# Patient Record
Sex: Male | Born: 1940 | Race: White | Hispanic: No | Marital: Married | Smoking: Never smoker
Health system: Southern US, Community
[De-identification: ages and names within clinical notes are randomized; demographics above are authoritative.]

## PROBLEM LIST (undated history)

## (undated) DIAGNOSIS — I4892 Unspecified atrial flutter: Secondary | ICD-10-CM

## (undated) DIAGNOSIS — I471 Supraventricular tachycardia, unspecified: Secondary | ICD-10-CM

## (undated) DIAGNOSIS — R42 Dizziness and giddiness: Secondary | ICD-10-CM

## (undated) DIAGNOSIS — Z87442 Personal history of urinary calculi: Secondary | ICD-10-CM

## (undated) DIAGNOSIS — I493 Ventricular premature depolarization: Secondary | ICD-10-CM

## (undated) DIAGNOSIS — I2510 Atherosclerotic heart disease of native coronary artery without angina pectoris: Secondary | ICD-10-CM

## (undated) DIAGNOSIS — I509 Heart failure, unspecified: Secondary | ICD-10-CM

## (undated) DIAGNOSIS — N289 Disorder of kidney and ureter, unspecified: Secondary | ICD-10-CM

## (undated) DIAGNOSIS — M109 Gout, unspecified: Secondary | ICD-10-CM

## (undated) DIAGNOSIS — N400 Enlarged prostate without lower urinary tract symptoms: Secondary | ICD-10-CM

## (undated) DIAGNOSIS — I480 Paroxysmal atrial fibrillation: Secondary | ICD-10-CM

## (undated) DIAGNOSIS — K219 Gastro-esophageal reflux disease without esophagitis: Secondary | ICD-10-CM

## (undated) DIAGNOSIS — M5431 Sciatica, right side: Secondary | ICD-10-CM

## (undated) DIAGNOSIS — I428 Other cardiomyopathies: Secondary | ICD-10-CM

## (undated) DIAGNOSIS — M1990 Unspecified osteoarthritis, unspecified site: Secondary | ICD-10-CM

## (undated) DIAGNOSIS — E039 Hypothyroidism, unspecified: Secondary | ICD-10-CM

## (undated) DIAGNOSIS — I499 Cardiac arrhythmia, unspecified: Secondary | ICD-10-CM

## (undated) HISTORY — DX: Heart failure, unspecified: I50.9

## (undated) HISTORY — PX: ABSCESS DRAINAGE: SHX1119

## (undated) HISTORY — DX: Dizziness and giddiness: R42

## (undated) HISTORY — DX: Supraventricular tachycardia, unspecified: I47.10

## (undated) HISTORY — DX: Other cardiomyopathies: I42.8

## (undated) HISTORY — DX: Ventricular premature depolarization: I49.3

## (undated) HISTORY — DX: Supraventricular tachycardia: I47.1

## (undated) HISTORY — PX: BACK SURGERY: SHX140

## (undated) HISTORY — DX: Unspecified atrial flutter: I48.92

## (undated) HISTORY — PX: CATARACT EXTRACTION: SUR2

## (undated) HISTORY — PX: COLONOSCOPY: SHX174

## (undated) HISTORY — DX: Paroxysmal atrial fibrillation: I48.0

## (undated) HISTORY — PX: PARTIAL KNEE ARTHROPLASTY: SHX2174

## (undated) HISTORY — DX: Atherosclerotic heart disease of native coronary artery without angina pectoris: I25.10

## (undated) HISTORY — PX: DUPUYTREN / PALMAR FASCIOTOMY: SUR601

---

## 1997-11-20 HISTORY — PX: HAND SURGERY: SHX662

## 2007-11-21 HISTORY — PX: CARDIAC CATHETERIZATION: SHX172

## 2008-01-11 ENCOUNTER — Inpatient Hospital Stay: Payer: Self-pay | Admitting: Internal Medicine

## 2008-01-11 ENCOUNTER — Other Ambulatory Visit: Payer: Self-pay

## 2008-01-12 ENCOUNTER — Other Ambulatory Visit: Payer: Self-pay

## 2008-01-13 ENCOUNTER — Ambulatory Visit: Payer: Self-pay | Admitting: Internal Medicine

## 2008-03-16 ENCOUNTER — Ambulatory Visit: Payer: Self-pay | Admitting: Internal Medicine

## 2008-04-20 ENCOUNTER — Ambulatory Visit: Payer: Self-pay | Admitting: Internal Medicine

## 2008-05-06 ENCOUNTER — Ambulatory Visit: Payer: Self-pay | Admitting: Internal Medicine

## 2008-05-06 ENCOUNTER — Encounter: Payer: Self-pay | Admitting: Internal Medicine

## 2008-05-06 LAB — CONVERTED CEMR LAB
CO2: 25 meq/L (ref 19–32)
Calcium: 9.1 mg/dL (ref 8.4–10.5)
Glucose, Bld: 86 mg/dL (ref 70–99)
HCT: 40.8 % (ref 39.0–52.0)
MCHC: 33.8 g/dL (ref 30.0–36.0)
MCV: 87 fL (ref 78.0–100.0)
Platelets: 190 10*3/uL (ref 150–400)
RDW: 13.2 % (ref 11.5–15.5)
Sodium: 141 meq/L (ref 135–145)
WBC: 5.7 10*3/uL (ref 4.0–10.5)

## 2008-05-13 ENCOUNTER — Ambulatory Visit: Payer: Self-pay | Admitting: Internal Medicine

## 2008-06-01 ENCOUNTER — Ambulatory Visit: Payer: Self-pay | Admitting: Internal Medicine

## 2008-12-28 ENCOUNTER — Ambulatory Visit: Payer: Self-pay | Admitting: Internal Medicine

## 2009-03-03 ENCOUNTER — Encounter: Payer: Self-pay | Admitting: Internal Medicine

## 2009-04-08 ENCOUNTER — Telehealth: Payer: Self-pay | Admitting: Internal Medicine

## 2009-04-15 ENCOUNTER — Telehealth: Payer: Self-pay | Admitting: Internal Medicine

## 2009-05-13 DIAGNOSIS — I429 Cardiomyopathy, unspecified: Secondary | ICD-10-CM | POA: Insufficient documentation

## 2009-05-13 DIAGNOSIS — I509 Heart failure, unspecified: Secondary | ICD-10-CM | POA: Insufficient documentation

## 2009-05-13 DIAGNOSIS — R42 Dizziness and giddiness: Secondary | ICD-10-CM | POA: Insufficient documentation

## 2009-05-13 DIAGNOSIS — I471 Supraventricular tachycardia: Secondary | ICD-10-CM | POA: Insufficient documentation

## 2009-06-28 ENCOUNTER — Ambulatory Visit: Payer: Self-pay | Admitting: Internal Medicine

## 2009-10-25 ENCOUNTER — Encounter: Payer: Self-pay | Admitting: Internal Medicine

## 2009-10-25 LAB — CONVERTED CEMR LAB
ALT: 18 units/L
AST: 21 units/L
Alkaline Phosphatase: 55 units/L
Calcium: 9.8 mg/dL
Glucose, Bld: 92 mg/dL
HCT: 44.9 %
HDL: 37.3 mg/dL
LDL Cholesterol: 142.9 mg/dL
MCHC: 33.9 g/dL
MCV: 87.5 fL
Sodium: 139.4 meq/L

## 2009-10-26 ENCOUNTER — Encounter: Payer: Self-pay | Admitting: Internal Medicine

## 2009-10-27 ENCOUNTER — Telehealth (INDEPENDENT_AMBULATORY_CARE_PROVIDER_SITE_OTHER): Payer: Self-pay | Admitting: *Deleted

## 2009-10-27 ENCOUNTER — Encounter: Payer: Self-pay | Admitting: Internal Medicine

## 2009-10-28 ENCOUNTER — Telehealth: Payer: Self-pay | Admitting: Internal Medicine

## 2009-11-09 ENCOUNTER — Ambulatory Visit: Payer: Self-pay | Admitting: Internal Medicine

## 2009-11-15 ENCOUNTER — Ambulatory Visit: Payer: Self-pay | Admitting: Unknown Physician Specialty

## 2009-11-17 ENCOUNTER — Ambulatory Visit: Payer: Self-pay | Admitting: Unknown Physician Specialty

## 2010-04-27 ENCOUNTER — Ambulatory Visit: Payer: Self-pay | Admitting: Unknown Physician Specialty

## 2010-05-03 ENCOUNTER — Telehealth: Payer: Self-pay | Admitting: Internal Medicine

## 2010-05-10 ENCOUNTER — Ambulatory Visit: Payer: Self-pay | Admitting: Internal Medicine

## 2010-05-11 ENCOUNTER — Ambulatory Visit: Payer: Self-pay | Admitting: Unknown Physician Specialty

## 2010-05-12 ENCOUNTER — Ambulatory Visit: Payer: Self-pay | Admitting: Unknown Physician Specialty

## 2010-09-01 ENCOUNTER — Telehealth: Payer: Self-pay | Admitting: Internal Medicine

## 2010-11-20 HISTORY — PX: JOINT REPLACEMENT: SHX530

## 2010-12-20 NOTE — Assessment & Plan Note (Signed)
Summary: per check out/sf   Visit Type:  Follow-up  CC:  Cardiac clearance for R knee surg. (Dr. Gerrit Heck). No cardiac complaints.  History of Present Illness:  Mr. Beckel is seen in followup for supraventricular tachycardia. He also has a history of cardiomyopathy  with a recent worsening of LV function measured by echo in  December at 35-40%. his ultrasound was undertaken simply as routine. Echo and catheterization from 2009 had demonstrated an ejection fraction of 4045% range with a mild nonobstructive 20% plaque in his LAD.   last fall he underwent Dupuytren's contracture surgery without difficulty. He has had no problems recently with chest pain shortness of breath edema. He has had one episode when taking his blood pressure his heart rate was noted to be fast;  this was largely without symptoms.  he unfortunately her his knee while walking. MRI demonstrated tears of the meniscus. He is scheduled to undergo upper scalp repair. He is here for preoperative clearance     Current Medications (verified): 1)  Ramipril 2.5 Mg Caps (Ramipril) .... Take One Capsule By Mouth Daily 2)  Bisoprolol Fumarate 5 Mg Tabs (Bisoprolol Fumarate) .... Take 1/2  Tablet By Mouth Daily 3)  Avodart 0.5 Mg Caps (Dutasteride) .[name].. 1 By Mouth Once Daily 4)  Omeprazole 20 Mg Cpdr (Omeprazole) .[name].. 1 By Mouth Once Daily 5)  Aspirin 81 Mg Tbec (Aspirin) .... Take One Tablet By Mouth Daily 6)  Daily Multi  Tabs (Multiple Vitamins-Minerals) .[name].. 1 By Mouth Once Daily 7)  Glucosamine-Chondroitin 1500-1200 Mg/88ml Liqd (Glucosamine-Chondroitin) .[name].. 1 By Mouth Once Daily 8)  Tylenol Extra Strength 500 Mg Tabs (Acetaminophen) .... 2000mg  Daily As Needed  Allergies (verified): No Known Drug Allergies  Vital Signs:  Patient profile:   70 year old male Height:      72 inches Weight:      215 pounds BMI:     29.26 Pulse rate:   62 / minute BP sitting:   112 / 80  (left arm) Cuff size:   regular  Vitals Entered  By: Bishop Dublin, CMA (May 10, 2010 1:38 PM)  Physical Exam  General:  Alert and oriented in no acute distress. HEENT  normal . Neck veins were flat; carotids brisk and full without bruits. No lymphadenopathy. Back without kyphosis. Lungs clear. Heart sounds regular without murmurs or gallops. PMI nondisplaced. Abdomen soft with active bowel sounds without midline pulsation or hepatomegaly. Femoral pulses and distal pulses intact. Extremities were without clubbing cyanosis or edemaSkin warm and dry. Neurological exam grossly normal affect is normal   EKG  Procedure date:  05/10/2010  Findings:      sinyus@ 63 .17/.08/.42/w normal  Impression & Recommendations:  Problem # 1:  CARDIOMYOPATHY, SECONDARY (ICD-425.9) stable on current medications.  HIs low blood pressure precludes further uptitration of meds  His updated medication list for this problem includes:    Ramipril 2.5 Mg Caps (Ramipril) .[name]... Take one capsule by mouth daily    Bisoprolol Fumarate 5 Mg Tabs (Bisoprolol fumarate) .[name]... Take 1/2  tablet by mouth daily    Aspirin 81 Mg Tbec (Aspirin) .[name]... Take one tablet by mouth daily  Problem # 2:  SVT/ PSVT/ PAT (ICD-427.0) one intercurretn episode of which we are aware  Problem # 3:  PREOPERATIVE EXAMINATION (ICD-V72.84) Mr. Albarran should be an acceptable risk for surgery. He might have SVT; this should be responsive to adenosine .  his left ventricular dysfunction close attention to fluids would be important

## 2010-12-20 NOTE — Progress Notes (Signed)
Summary: SURGICAL CLEARANCE   Phone Note Call from Patient Call back at Home Phone [phone]   Caller: SELF Call For: KLEIN Summary of Call: NEEDS SURGICAL CLEARANCE FOR KNEE SURGERY-DR CALIFF AT [facility] #[phone] Initial call taken by: Harlon Flor,  May 03, 2010 8:55 AM  Follow-up for Phone Call        per December OV pt was cleared for hand sugery is now pending knee surgery does he need OV or OK to proceed with surgery? Follow-up by: Benedict Needy, RN,  May 03, 2010 9:01 AM  Additional Follow-up for Phone Call Additional follow up Details #1::        i htink the answer is yes, in that he needs to have an evaluation as to the status of htings, ie has anything changed Additional Follow-up by: Nathen May, MD, [facility],  May 04, 2010 2:10 PM     Appended Document: SURGICAL CLEARANCE appointment scheduled for june with Dr [name]

## 2010-12-20 NOTE — Progress Notes (Signed)
Summary: surgical clearance  Phone Note Call from Patient   Summary of Call: Pt wanted to know if surgical clearance had been faxed to [facility].  Their fax number is [phone].  Please call pt back as well and advise. Initial call taken by: Park Breed,  May 03, 2010 3:26 PM  Follow-up for Phone Call        call and spoke to pt sent flag to Dr. [name] unsure if pt needs to be seen in OV before cleared for surgery.  Cell # [phone] Benedict Needy, RN  May 03, 2010 4:44 PM   Additional Follow-up for Phone Call Additional follow up Details #1::        pt left a message that he would like a call back regarding if the clearance has been sent yet. Additional Follow-up by: Harlon Flor,  May 05, 2010 2:25 PM    Additional Follow-up for Phone Call Additional follow up Details #2::    LMOM TCB Benedict Needy, RN  May 05, 2010 3:25 PM   Additional Follow-up for Phone Call Additional follow up Details #3:: Details for Additional Follow-up Action Taken: [facility] to inform the pt that he will need to come in for an appt before [name] can give clearance-Pt already had an appt scheduled for 6/28 @ 3:00-I reinformed him of the appt date and time and that we do not have a sooner appt available here with [name] in [facility] Additional Follow-up by: Harlon Flor,  May 05, 2010 4:09 PM

## 2010-12-20 NOTE — Progress Notes (Signed)
Summary: MEDICATION  Phone Note Call from Patient Call back at Home Phone [phone]   Caller: SELF Call For: KLEIN Summary of Call: PT WOULD LIKE TO KNOW IF HE WOULD HAVE ANY PROBLEM TAKING CELEBREX Initial call taken by: Harlon Flor,  September 01, 2010 12:11 PM  Follow-up for Phone Call        asked pt to talk to pharmacist. Benedict Needy, RN  September 01, 2010 4:58 PM

## 2011-02-24 ENCOUNTER — Other Ambulatory Visit: Payer: Self-pay | Admitting: Internal Medicine

## 2011-04-04 NOTE — Letter (Signed)
June 01, 2008    Dr. Corliss Parish  [address] [place], [place] [address]   RE:  Vincent, [name]  MRN:  [id number]  /  DOB:  02-11-1941   Dear Vincent [name]:   Vincent [name] comes in for followup following his catheterization which  demonstrated nonobstructive coronary disease and improvement in his LV  systolic function now to about 50% on the of blocker therapy.  His blood  pressure has tolerated the introduction a low-dose ACE inhibitors and he  is now on Altace 2.5 in addition to his bisoprolol 2.5 a day.   EXAMINATION:  His blood pressure is 96/60.  His pulse was 60.  LUNGS:  Clear.  HEART:  Sounds were regular.  EXTREMITIES:  Without edema.   IMPRESSION:  1. Improving cardiomyopathy.  2. 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  3. Beta blockers and ACE inhibitors for #1.  4. Intermittent lightheadedness likely related to hypotension.   Vincent [name] is stable at this point.  I have told him that he probably  should anticipate being on these medications long-term unless symptoms  of lightheadedness intervene.  At that point, I would probably drop his  ACE inhibitor.   We will see him again in 6 months' time.    Sincerely,      Vincent Salvia, MD, [facility]  Electronically Signed    SCK/MedQ  DD: 06/01/2008  DT: 06/01/2008  Job #: [id number]

## 2011-04-04 NOTE — Progress Notes (Signed)
[facility] OFFICE NOTE   NAME:Vincent [name], Vincent [name]                  MRN:          [id number]  DATE:12/28/2008                            DOB:          May 29, 1941    [name] comes in today in followup for a nonischemic cardiomyopathy  which is largely improved with estimated ejection fraction about 50%.   He is doing really quite well except for one morning in January, he felt  dizzy and his heart rate was going fast, but regular.  Wife recorded his  blood pressure in the 80s and pulse of 100 as compared to the normal  60s.  Other than that, he has done quite well.   His medications include bisoprolol 2.5 daily and the Altace 2.5 and he  has tolerated both of this.  He also takes Avodart every other day,  Prilosec, and Tylenol.   PHYSICAL EXAMINATION:  VITAL SIGNS:  His blood pressure is 104/72 with a  pulse of 59.  The weight was 218 which is stable.  LUNGS:  Clear.  HEART:  Sounds were regular.  ABDOMEN:  Soft.  EXTREMITIES:  Without edema.   IMPRESSION:  1. Supraventricular tachycardia probably atrioventricular nodal      reentry also with recurrent palpitations.  2. Dizziness associated with supraventricular tachycardia.  3. Modest left ventricular dysfunction markedly improved on beta-      blockers and ACE inhibitors.   [name] is stable.  We will plan to see him again in 6 months' time.  We will continue his current medications.  He is to come by the office  if he has recurrent lightheadedness associated with palpitations.     Duke Salvia, MD, [facility]  Electronically Signed    SCK/MedQ  DD: 12/28/2008  DT: 12/29/2008  Job #: [id number]   cc:   Corliss Parish

## 2011-04-04 NOTE — Letter (Signed)
March 16, 2008    Corliss Parish  [address] [place], [place] [address]   RE:  Vincent [name], Vincent [name]  MRN:  [id number]  /  DOB:  02-13-1941   Dear [name]:   I hope this letter finds you well, and hopefully I will have talked to  you before you get this. It was a pleasure to see Vincent [name] at your  request; he came in with his wife.   He recounts hospitalization in mid February when he presented with  nocturnal chest pain. While in the hospital, he apparently ruled out for  myocardial infarction; he was submitted for Myoview scanning which was  normal. His echocardiogram was not normal (see below). While in the  hospital, he was awakened in the middle of the night with a tachycardia.  The details of how it terminated are not available, but I was able to  track down an electrocardiogram of it (see below), and it subsequently  terminated.   It turns out he has a three- to five-year history of recurrent abrupt  onset/offset tachy palpitations. These spells have lasted minutes to  maybe hours. They are accompanied by significant lightheadedness but no  chest pain or shortness of breath. There is some accompanying  diaphoresis. They are FROG negative and diuretic negative.   He also has episodes of dizziness that are unassociated with his tachy  palpitations but are otherwise quite similar. They and his tachy  palpitations occur mostly while he is standing. He has not noted them  lying, and as noted in the hospital when he was lying down was unaware  of his tachycardia.   He has some modest exercise intolerance; mostly this is shortness of  breath which has been progressive over the last number of months. This  is further accompanied by fatigue which he dates back to the initiation  of his carvedilol (my mistake, atenolol). He has no syncope. He has had  no nocturnal dyspnea. He has had no peripheral edema.   His past medical history is notable for:  1. Allergies.  2. Constipation.  3.  GE reflux disease.  4. Urinary problems.  5. Arthritis.  6. Gout.   He has a remote history of hand surgery.   SOCIAL HISTORY:  He is married. He has two children, five grandchildren.  He is retired from the Restaurant manager, fast food. He does not use  cigarettes or alcohol or recreational drugs. He does play golf and works  in the yard.   His current medications include:  1. Atenolol 12.5.  2. Avodart 0.5.  3. Prilosec 20.   He has no known drug allergies.   On examination, he is an older Caucasian male appearing his stated age  of 77. His blood pressure is 106/74. His pulse was 64. His weight was  212, and he is 5 feet 11.  His HEENT exam demonstrated no icterus or xanthoma.  The neck veins were flat. The carotids were brisk and full bilaterally  without bruits.  The back was without kyphosis or scoliosis.  His lungs were clear.  HEART:  Sounds were regular without murmurs or gallops.  The abdomen was soft with active bowel sounds. No pulsation or  hepatomegaly.  Femoral pulses were 2+. Distal pulses were intact. There was no  clubbing, cyanosis, or edema.  His neurological exam was grossly normal.  The skin was warm and dry.   Electrocardiogram dated today demonstrated sinus rhythm at 64 with  interval  of 0.17/0.09/0.42. The axis was 8 degrees, and there was no  evidence of ventricular preexcitation.   Electrocardiogram dated February 22 at 0213 hours demonstrated a narrow  QRS tachycardia at a cycle length of approximately 400 milliseconds.  There was a prominent pseudo R prime in lead V1. It was not evident on  the electrocardiogram preceding this one by 24 hours or the one that  succeeded this one by 8 hours.   He brings in blood pressure recordings from home which are notable for  numbers as low as 87 and as high as 116.   Also, there was a Holter monitor dated March 2009 demonstrating 2% PVCs,  something that apparently had been seen on the  echocardiogram.   IMPRESSION:  1. 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  2. Cardiomyopathy of unclear origin with ejection fraction of 45%,      biatrial enlargement, and moderate AV regurgitation.  3. Class II congestive heart failure.  4. Moderate amount of fatigue, temporary related to the initiation of      his atenolol.   John, Vincent [name] has documented SVT which is associated with significant  hypotension and not surprising quite significant dizziness. Somewhat  surprisingly, he is only variably aware of the tachycardia, as when he  was in the hospital. Because of that, I think any symptoms of dizziness  I think we should infer are related to recurrent tachycardia.   Given his cardiomyopathy, the cause of which is not known but the  biatrial enlargement of which suggested there may be a restrictive  process, beta blockers are probably the right long-term therapy, and  somehow I got confused, I thought he was taking the carvedilol instead  of the atenolol 12.5, but I did give him alternative beta blockers  today, bisoprolol 2.5 and metoprolol 12.5 mg b.i.d., to see if either  one of those might be better tolerated than the atenolol. In the event  that there are not, we can certainly try carvedilol 3.125 b.i.d. and  see. It was his preference that if we could control the tachycardia as  well as get the long-term benefits of beta blockade for his  cardiomyopathy at one time that he would prefer not to have an ablation  procedure.   We did discuss the ablation at some length, and I think if he has  recurrent symptoms either of tachy palpitations or dizziness he would be  relatively inclined to pursue catheter ablation for management of the  tachycardia.   As relates to his cardiomyopathy, I think the  beta blockers are right. I  think also based on the SAVE data probably the right thing to do is to  add an ACE inhibitor if his blood pressure will tolerate it. I am not  altogether sanguine about this given the blood pressure measurements  that he brought in today, so it may well be something that we cannot do.   I will plan to sit down with him again in about four weeks to see how he  is doing on his beta blockers, and if he fails the first two, then I  will try him on carvedilol prior to seeing him as well.   John, I look forward to talking to you. If there is anything I can do,  please do not hesitate to contact me. My cell number is [phone].    Sincerely,      Duke Salvia, MD, [facility]  Electronically Signed    SCK/MedQ  DD: 03/16/2008  DT: 03/16/2008  Job #: [id number]

## 2011-04-04 NOTE — Letter (Signed)
April 20, 2008    Corliss Parish, MD  [address] [place],  [place] [address]   RE:  LEGRAND, LASSER  MRN:  [id number]  /  DOB:  [birth year]   Dear [name]:   Mr. Merrick came in today with his wife.  He has been doing better.  He is  feeling much less fatigue on the bisoprolol and metoprolol versus the  atenolol.  He is playing golf. He is having no problems with shortness  of breath or edema.  He has had no recurrent tachy palpitations or  dizziness.   MEDICATIONS:  In addition of metoprolol include the Avodart and aspirin.   As you recall, we did not start an ACE inhibitor because of his  relatively low blood pressure.   PHYSICAL EXAMINATION:  Today, his blood pressure was 90/60 with a pulse  of 68.  Interestingly, his wife brings in blood pressure measurements  for the last month and they are almost all above 100 with  just 5 in the  90s.  His lungs were clear.  His heart sounds were regular.  NECK:  Veins were flat.  The abdomen was soft.  EXTREMITIES:  Without edema.  Skin was warm and dry.   IMPRESSION:  1. 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  2. Cardiomyopathy with evidence of moderate mitral and tricuspid      regurgitation, question restrictive myopathy.  3. Borderline low blood pressure.   John, Mr. Day is doing fine. We are going to go ahead and put him back  on bisoprolol as opposed to metoprolol tartrate as we have data for it  in cardiomyopathy.   I am also going to start him on an ACE inhibitor specifically Altace at  1.25 mg a day.  I have reviewed with his wife the potential benefits as  well as potential side effects including angioedema and renal  dysfunction and as it relates to the latter we will plan to get blood  work in about 3 weeks time.   Given the underlying cardiomyopathy, the question remains as to its  cause.  It is presumed to be nonischemic as on his Myoview scan; I  discussed with them that my bias in patients like  him is to do  catheterization so that we know that he does not fall into the 10% of  patient's in whom balance ischemia might be mistaken for nonischemic  Myoview.  He would like to pursue catheterization and asked that we do  it.  To that end, I have scheduled him to be done at [facility] with Dr. Nicholes Mango in the next couple of weeks.  We will work this around his  golf schedule.  We have talked about the potential benefits as well as  potential risks and he understands and would like to proceed.   I will plan to see again in 3-4 months time.   If there is anything that I can do in the interim, please do not  hesitate to contact me.    Sincerely,      Duke Salvia, MD, [facility]  Electronically Signed    SCK/MedQ  DD: 04/20/2008  DT: 04/20/2008  Job #: [id number]

## 2011-05-12 ENCOUNTER — Encounter: Payer: Self-pay | Admitting: Cardiovascular Disease

## 2011-07-31 ENCOUNTER — Other Ambulatory Visit: Payer: Self-pay | Admitting: Internal Medicine

## 2011-08-10 ENCOUNTER — Encounter: Payer: Self-pay | Admitting: *Deleted

## 2011-08-10 ENCOUNTER — Other Ambulatory Visit: Payer: Self-pay | Admitting: Internal Medicine

## 2011-08-10 ENCOUNTER — Encounter: Payer: Self-pay | Admitting: Internal Medicine

## 2011-08-10 ENCOUNTER — Ambulatory Visit (INDEPENDENT_AMBULATORY_CARE_PROVIDER_SITE_OTHER): Payer: Medicare Other | Admitting: Internal Medicine

## 2011-08-10 DIAGNOSIS — I959 Hypotension, unspecified: Secondary | ICD-10-CM | POA: Insufficient documentation

## 2011-08-10 DIAGNOSIS — I493 Ventricular premature depolarization: Secondary | ICD-10-CM | POA: Insufficient documentation

## 2011-08-10 DIAGNOSIS — I4949 Other premature depolarization: Secondary | ICD-10-CM

## 2011-08-10 DIAGNOSIS — I471 Supraventricular tachycardia: Secondary | ICD-10-CM

## 2011-08-10 DIAGNOSIS — R61 Generalized hyperhidrosis: Secondary | ICD-10-CM

## 2011-08-10 DIAGNOSIS — I429 Cardiomyopathy, unspecified: Secondary | ICD-10-CM

## 2011-08-10 DIAGNOSIS — Z01810 Encounter for preprocedural cardiovascular examination: Secondary | ICD-10-CM | POA: Insufficient documentation

## 2011-08-10 NOTE — Progress Notes (Signed)
  HPI  Vincent [name] is a 70 y.o. male  seen in followup for supraventricular tachycardia. He also has a history of cardiomyopathy  with a recent worsening of LV function measured by echo in  December at 35-40%.  Echo and catheterization from 2009 had demonstrated an ejection fraction of 4045% range with a mild nonobstructive 20% plaque in his LAD.   He has undergone Dupuytren's contracture surgery without difficulty; he underwent arthroscopic knee surgery last year without difficulty. He is however now in need of total knee replacement.  He has however had 2 spells of late that are of concern. Both were associated with diaphoresis and hypotension occurring in the morning. I should note that he takes his cardiomyopathy medications at night. One of these was associated with a heart rate of 120; the family does not recall what it was regular or irregular. These were different from his SVT episodes.      Past Medical History  Diagnosis Date  . Congestive heart failure, unspecified   . Paroxysmal supraventricular tachycardia     SVT/PSVT/PAT  . Dizziness and giddiness   . Secondary cardiomyopathy, unspecified     Past Surgical History  Procedure Date  . Hand surgery 1999    Current Outpatient Prescriptions  Medication Sig Dispense Refill  . aspirin 81 MG EC tablet Take 81 mg by mouth daily.        . bisoprolol (ZEBETA) 5 MG tablet TAKE ONE-HALF (1/2) TABLET DAILY  45 tablet  1  . Chlorphen-Pseudoephed-APAP (CORICIDIN D PO) Take by mouth as needed.        . dutasteride (AVODART) 0.5 MG capsule Take 0.5 mg by mouth daily.        . Glucosamine-Chondroitin 1500-1200 MG/30ML LIQD Take by mouth daily.        . Imiquimod (ZYCLARA) 3.75 % CREA Apply topically at bedtime.        . metroNIDAZOLE (METROGEL) 1 % gel Apply topically daily.        . Multiple Vitamin (MULTIVITAMIN) tablet Take 1 tablet by mouth daily.        . naproxen sodium (ALEVE) 220 MG tablet Take 220 mg by mouth as  needed.        [name] omeprazole (PRILOSEC) 20 MG capsule Take 20 mg by mouth daily.        . ramipril (ALTACE) 2.5 MG capsule TAKE 1 CAPSULE DAILY  90 capsule  3  . traMADol (ULTRAM) 50 MG tablet Take 50 mg by mouth every 6 (six) hours as needed.          No Known Allergies  Review of Systems negative except from HPI and PMH  Physical Exam Well developed and well nourished in no acute distress HENT normal E scleral and icterus clear Neck Supple JVP flat; carotids brisk and full Clear to ausculation Regular rate and rhythm, no murmurs gallops or rub Soft with active bowel sounds No clubbing cyanosis and edema Alert and oriented, grossly normal motor and sensory function Skin Warm and Dry  ECGSinus rhythm at 65 Intervals 0.17/0.09/0.44 Occasional PVCs with a superior axis morphology  Assessment and  Plan

## 2011-08-10 NOTE — Assessment & Plan Note (Signed)
No known recurrences 

## 2011-08-10 NOTE — Assessment & Plan Note (Signed)
He is a frequent PVCs on his left card event. Given that he has change in left ventricular function quantitation of his PVC burn will be essential. Recent data suggests that broad QRS duration PVCs may be more likely associated with cardiomyopathy makes them of concern

## 2011-08-10 NOTE — Assessment & Plan Note (Signed)
The patient is in need of knee replacement surgery. He has a known cardiomyopathy which has not been assessed in the last couple of years. Given the alarming nature of these recent spells, I think not withstanding the absence of change in his functional status, that reevaluation of his cardiac performance is essential. To both exclude significant coronary disease as well as measured left ventricular function we'll undertake elective scan Myoview. He is unable to walk because of his knee

## 2011-08-10 NOTE — Assessment & Plan Note (Signed)
Continue current medications. 

## 2011-08-10 NOTE — Assessment & Plan Note (Signed)
He has had 2 episodes of hypotension associated one with a rapid heart rate and one with a normal heart rate. We will try and understand whether they are associated with irregular or regular heart rate. I described this to him and his wife. I've also suggested that in the event that he has recurrent hypotension, that he go to the local fire station where his vital signs and no echocardiographic recording can be obtained. They can also come here

## 2011-08-17 ENCOUNTER — Encounter: Payer: Self-pay | Admitting: *Deleted

## 2011-08-18 ENCOUNTER — Ambulatory Visit: Payer: Self-pay | Admitting: Internal Medicine

## 2011-08-18 ENCOUNTER — Telehealth: Payer: Self-pay

## 2011-08-18 DIAGNOSIS — I428 Other cardiomyopathies: Secondary | ICD-10-CM

## 2011-08-18 NOTE — Telephone Encounter (Signed)
LMOM for patient to call the office with information regarding the doctor doing the surgery.

## 2011-08-18 NOTE — Telephone Encounter (Signed)
Per Dr. Mariah Milling the patient is at low risk for his up coming surgery.  Dr. Mariah Milling read stress test and states test looks good.

## 2011-08-18 NOTE — Telephone Encounter (Signed)
Notified Dr. [name] per Dr. Mariah Milling the patient is at low risk for his upcoming surgery and that stress test looked good. Per Dr. [name] okay to send a cardiac clearance for his upcoming surgery.

## 2011-08-18 NOTE — Telephone Encounter (Signed)
[facility] in [place] with Dr. Jinny Sanders is doing his knee surgery.  The phone number to contact is [phone] and the fax number is [phone].

## 2011-08-21 NOTE — Telephone Encounter (Signed)
Mr. Vincent [name] should be at low risk for knee surgery. Please contact us for further questions.

## 2011-08-23 NOTE — Telephone Encounter (Signed)
Faxed clearance letter to Dr. Nicholaus Bloom at [facility] for Oct. 16, 2012. The phone number is [phone] and the fax number is [phone].

## 2011-08-25 ENCOUNTER — Encounter: Payer: Self-pay | Admitting: Internal Medicine

## 2012-01-27 ENCOUNTER — Other Ambulatory Visit: Payer: Self-pay | Admitting: Internal Medicine

## 2012-02-08 DIAGNOSIS — M25569 Pain in unspecified knee: Secondary | ICD-10-CM | POA: Insufficient documentation

## 2012-02-08 DIAGNOSIS — M25469 Effusion, unspecified knee: Secondary | ICD-10-CM | POA: Insufficient documentation

## 2012-03-20 DIAGNOSIS — Z96659 Presence of unspecified artificial knee joint: Secondary | ICD-10-CM | POA: Insufficient documentation

## 2012-03-20 DIAGNOSIS — M1710 Unilateral primary osteoarthritis, unspecified knee: Secondary | ICD-10-CM | POA: Insufficient documentation

## 2012-03-26 ENCOUNTER — Encounter: Payer: Self-pay | Admitting: Cardiovascular Disease

## 2012-03-26 ENCOUNTER — Ambulatory Visit (INDEPENDENT_AMBULATORY_CARE_PROVIDER_SITE_OTHER): Payer: Medicare Other | Admitting: Cardiovascular Disease

## 2012-03-26 VITALS — BP 118/64 | HR 72 | Ht 71.0 in | Wt 215.0 lb

## 2012-03-26 DIAGNOSIS — I493 Ventricular premature depolarization: Secondary | ICD-10-CM

## 2012-03-26 DIAGNOSIS — I471 Supraventricular tachycardia: Secondary | ICD-10-CM

## 2012-03-26 DIAGNOSIS — Z01810 Encounter for preprocedural cardiovascular examination: Secondary | ICD-10-CM

## 2012-03-26 DIAGNOSIS — I4949 Other premature depolarization: Secondary | ICD-10-CM

## 2012-03-26 DIAGNOSIS — I509 Heart failure, unspecified: Secondary | ICD-10-CM

## 2012-03-26 NOTE — Assessment & Plan Note (Signed)
The patient's need to have colonoscopy done. He did have a stress test done last year which overall was unremarkable. If his echocardiogram does not show significant worsening in his ejection fraction, he can proceed with colonoscopy with an overall low risk.

## 2012-03-26 NOTE — Assessment & Plan Note (Signed)
The patient is having more PVCs than in the past. He was noted to have PVCs in September 2012 but now has progressed to ventricular bigeminy. He had a nuclear stress test in September 2012 which showed no evidence of ischemia. His symptoms include dizziness but no palpitations. The patient does not seem to consume excessive amount of caffeine to explain this. I will check routine labs including basic metabolic profile magnesium and TSH to look for reversible causes. I recommend a 48 hour Holter monitor as well as an echocardiogram to evaluate his LV systolic function. If his ejection fraction is worse than before, he might need to be evaluated by EP. He has seen Dr. [name] in the past.

## 2012-03-26 NOTE — Progress Notes (Signed)
HPI  Vincent [name] is a 71 y.o. male seen in followup for supraventricular tachycardia. He also has a history of cardiomyopathy with a previous ejection fraction of 35-40%. Most recently it was 53% on nuclear stress test from September of 2012. Echo and catheterization from 2009 had demonstrated an ejection fraction of 40-45% range with a mild nonobstructive 20% plaque in his LAD.  He underwent right knee replacement in the fall of last year without complications. He was seen recently by gastroenterology for routine colonoscopy. He was noted to have frequent premature beats on physical exam. He was asked to followup with [place] and get clearance before colonoscopy. The patient did drink more caffeine that date and his average. He does not feel these PVCs. He does complain of occasional dizziness. He denies any chest pain. He has mild exertional dyspnea which has not worsened. Since his knee replacement, he has been able to resume playing golf. He denies excessive caffeine intake. He is not aware of thyroid problems. He does not smoke or drink alcohol.   No Known Allergies   Current Outpatient Prescriptions on File Prior to Visit  Medication Sig Dispense Refill  . aspirin 81 MG EC tablet Take 81 mg by mouth daily.        . bisoprolol (ZEBETA) 5 MG tablet TAKE ONE-HALF (1/2) TABLET DAILY                                   (DUE FOR FOLLOW UP WITH DR. [name])  45 tablet  0  . dutasteride (AVODART) 0.5 MG capsule Take 0.5 mg by mouth daily.        . Glucosamine-Chondroitin 1500-1200 MG/30ML LIQD Take by mouth daily.        . Multiple Vitamin (MULTIVITAMIN) tablet Take 1 tablet by mouth daily.        . naproxen sodium (ALEVE) 220 MG tablet Take 220 mg by mouth as needed.        [name] omeprazole (PRILOSEC) 20 MG capsule Take 20 mg by mouth daily.        . ramipril (ALTACE) 2.5 MG capsule TAKE 1 CAPSULE DAILY  90 capsule  1     Past Medical History  Diagnosis Date  . Congestive heart failure,  unspecified   . Dizziness and giddiness   . Secondary cardiomyopathy, unspecified   . Paroxysmal supraventricular tachycardia     SVT/PSVT/PAT     Past Surgical History  Procedure Date  . Hand surgery 1999  . Total knee arthroplasty      Family History  Problem Relation Age of Onset  . Coronary artery disease      family hx  . Heart failure      family hx - CHF     History   Social History  . Marital Status: Married    Spouse Name: N/A    Number of Children: N/A  . Years of Education: N/A   Occupational History  . Not on file.   Social History Main Topics  . Smoking status: Never Smoker   . Smokeless tobacco: Not on file   Comment: tobacco use - no  . Alcohol Use: No  . Drug Use: No  . Sexually Active: Not on file   Other Topics Concern  . Not on file   Social History Narrative   Retired, married, gets regular exercise.      PHYSICAL EXAM  BP 118/64  Pulse 72  Ht 5\' 11"  (1.803 m)  Wt 215 lb (97.523 kg)  BMI 29.99 kg/m2  Constitutional: He is oriented to person, place, and time. He appears well-developed and well-nourished. No distress.  HENT: No nasal discharge.  Head: Normocephalic and atraumatic.  Eyes: Pupils are equal and round. Right eye exhibits no discharge. Left eye exhibits no discharge.  Neck: Normal range of motion. Neck supple. No JVD present. No thyromegaly present.  Cardiovascular: Normal rate, regular rhythm with frequent premature beats, normal heart sounds and. Exam reveals no gallop and no friction rub. No murmur heard.  Pulmonary/Chest: Effort normal and breath sounds normal. No stridor. No respiratory distress. He has no wheezes. He has no rales. He exhibits no tenderness.  Abdominal: Soft. Bowel sounds are normal. He exhibits no distension. There is no tenderness. There is no rebound and no guarding.  Musculoskeletal: Normal range of motion. He exhibits no edema and no tenderness.  Neurological: He is alert and oriented to  person, place, and time. Coordination normal.  Skin: Skin is warm and dry. No rash noted. He is not diaphoretic. No erythema. No pallor.  Psychiatric: He has a normal mood and affect. His behavior is normal. Judgment and thought content normal.      EKG: Normal sinus rhythm with frequent PVCs in the form of ventricular bigeminy.   ASSESSMENT AND PLAN

## 2012-03-26 NOTE — Patient Instructions (Signed)
Your physician has requested that you have an echocardiogram. Echocardiography is a painless test that uses sound waves to create images of your heart. It provides your doctor with information about the size and shape of your heart and how well your heart's chambers and valves are working. This procedure takes approximately one hour. There are no restrictions for this procedure.  Your physician has recommended that you wear a holter monitor. Holter monitors are medical devices that record the heart's electrical activity. Doctors most often use these monitors to diagnose arrhythmias. Arrhythmias are problems with the speed or rhythm of the heartbeat. The monitor is a small, portable device. You can wear one while you do your normal daily activities. This is usually used to diagnose what is causing palpitations/syncope (passing out).  Labs to be done today.   Follow up after tests.

## 2012-03-26 NOTE — Assessment & Plan Note (Signed)
No evidence of recent episodes. Continue treatment with bisoprolol.

## 2012-03-27 LAB — TSH: TSH: 3.15 u[IU]/mL (ref 0.450–4.500)

## 2012-03-27 LAB — BASIC METABOLIC PANEL
CO2: 21 mmol/L (ref 20–32)
Calcium: 9.3 mg/dL (ref 8.6–10.2)
Glucose: 111 mg/dL — ABNORMAL HIGH (ref 65–99)
Potassium: 4.8 mmol/L (ref 3.5–5.2)
Sodium: 137 mmol/L (ref 134–144)

## 2012-03-27 LAB — MAGNESIUM: Magnesium: 1.8 mg/dL (ref 1.6–2.6)

## 2012-04-05 ENCOUNTER — Other Ambulatory Visit: Payer: Self-pay | Admitting: Internal Medicine

## 2012-04-10 ENCOUNTER — Encounter (INDEPENDENT_AMBULATORY_CARE_PROVIDER_SITE_OTHER): Payer: Medicare Other

## 2012-04-10 ENCOUNTER — Other Ambulatory Visit (INDEPENDENT_AMBULATORY_CARE_PROVIDER_SITE_OTHER): Payer: Medicare Other

## 2012-04-10 ENCOUNTER — Other Ambulatory Visit: Payer: Self-pay

## 2012-04-10 DIAGNOSIS — I471 Supraventricular tachycardia: Secondary | ICD-10-CM

## 2012-04-10 DIAGNOSIS — R55 Syncope and collapse: Secondary | ICD-10-CM

## 2012-04-10 DIAGNOSIS — I493 Ventricular premature depolarization: Secondary | ICD-10-CM

## 2012-04-10 DIAGNOSIS — I509 Heart failure, unspecified: Secondary | ICD-10-CM

## 2012-04-10 DIAGNOSIS — I369 Nonrheumatic tricuspid valve disorder, unspecified: Secondary | ICD-10-CM

## 2012-04-11 ENCOUNTER — Telehealth: Payer: Self-pay | Admitting: *Deleted

## 2012-04-11 NOTE — Telephone Encounter (Signed)
LM on identified voice that echocardiogram normal

## 2012-04-11 NOTE — Telephone Encounter (Signed)
Advised echo normal

## 2012-04-18 ENCOUNTER — Ambulatory Visit (INDEPENDENT_AMBULATORY_CARE_PROVIDER_SITE_OTHER): Payer: Medicare Other | Admitting: Cardiovascular Disease

## 2012-04-18 ENCOUNTER — Other Ambulatory Visit: Payer: Self-pay

## 2012-04-18 ENCOUNTER — Encounter: Payer: Self-pay | Admitting: Cardiovascular Disease

## 2012-04-18 VITALS — BP 76/60 | HR 60 | Ht 70.0 in | Wt 212.5 lb

## 2012-04-18 DIAGNOSIS — I4949 Other premature depolarization: Secondary | ICD-10-CM

## 2012-04-18 DIAGNOSIS — R002 Palpitations: Secondary | ICD-10-CM

## 2012-04-18 DIAGNOSIS — I471 Supraventricular tachycardia: Secondary | ICD-10-CM

## 2012-04-18 DIAGNOSIS — I493 Ventricular premature depolarization: Secondary | ICD-10-CM

## 2012-04-18 NOTE — Patient Instructions (Signed)
Stop Ramipril.  Monitor your blood pressure once daily.  Follow up in 3 months.

## 2012-04-19 NOTE — Assessment & Plan Note (Signed)
No evidence of recurrent episodes on Holter monitor.

## 2012-04-19 NOTE — Assessment & Plan Note (Signed)
I will go ahead and stop Ramipril.

## 2012-04-19 NOTE — Assessment & Plan Note (Signed)
The patient is having more PVCs than in the past. He was noted to have PVCs in September 2012 but now has progressed to ventricular bigeminy. He had a nuclear stress test in September 2012 which showed no evidence of ischemia. His symptoms include mild dizziness but no palpitations. The patient does not seem to consume excessive amount of caffeine to explain this. His labs were unremarkable including normal thyroid function. Continue treatment with a beta blocker at this time. The dose cannot be increased due to bradycardia. His echocardiogram showed that his ejection fraction is actually stable at 50-55%. If the patient continues to have frequent PVCs, I will consider sending him back to see Dr. [name] for a second opinion regarding antiarrhythmic medications.

## 2012-04-19 NOTE — Progress Notes (Signed)
HPI  Vincent [name] is a 71 y.o. male seen in followup for supraventricular tachycardia. He also has a history of cardiomyopathy with a previous ejection fraction of 35-40%. Most recently it was 53% on nuclear stress test from September of 2012.  Echo and catheterization from 2009 had demonstrated an ejection fraction of 40-45% range with a mild nonobstructive 20% plaque in his LAD.  He underwent right knee replacement in the fall of last year without complications. He was seen recently by gastroenterology for routine colonoscopy. He was noted to have frequent premature beats on physical exam. He was asked to followup with [place] and get clearance before colonoscopy.  He denies any chest pain. He has mild exertional dyspnea which has not worsened. Since his knee replacement, he has been able to resume playing golf.  He denies excessive caffeine intake. He is not aware of thyroid problems. He does not smoke or drink alcohol. He underwent evaluation with a Holter monitor which showed frequent PVCs some in the form of bigeminy. 21% of beats were actually PVCs. His echocardiogram showed low normal LV systolic function with an ejection fraction of 50-55% with mild mitral regurgitation. The patient is actually feeling well.  No Known Allergies   Current Outpatient Prescriptions on File Prior to Visit  Medication Sig Dispense Refill  . aspirin 81 MG EC tablet Take 81 mg by mouth daily.        . bisoprolol (ZEBETA) 5 MG tablet TAKE ONE-HALF (1/2) TABLET DAILY                                   (DUE FOR FOLLOW UP WITH DR. [name])  45 tablet  3  . dutasteride (AVODART) 0.5 MG capsule Take 0.5 mg by mouth daily.        . Glucosamine-Chondroitin 1500-1200 MG/30ML LIQD Take by mouth daily.        . Multiple Vitamin (MULTIVITAMIN) tablet Take 1 tablet by mouth daily.        . naproxen sodium (ALEVE) 220 MG tablet Take 220 mg by mouth as needed.        [name] omeprazole (PRILOSEC) 20 MG capsule Take 20 mg by  mouth daily.           Past Medical History  Diagnosis Date  . Congestive heart failure, unspecified   . Dizziness and giddiness   . Secondary cardiomyopathy, unspecified   . Paroxysmal supraventricular tachycardia     SVT/PSVT/PAT     Past Surgical History  Procedure Date  . Hand surgery 1999  . Total knee arthroplasty      Family History  Problem Relation Age of Onset  . Coronary artery disease      family hx  . Heart failure      family hx - CHF     History   Social History  . Marital Status: Married    Spouse Name: N/A    Number of Children: N/A  . Years of Education: N/A   Occupational History  . Not on file.   Social History Main Topics  . Smoking status: Never Smoker   . Smokeless tobacco: Not on file   Comment: tobacco use - no  . Alcohol Use: No  . Drug Use: No  . Sexually Active: Not on file   Other Topics Concern  . Not on file   Social History Narrative   Retired, married,  gets regular exercise.     PHYSICAL EXAM   BP 76/60  Pulse 60  Ht 5\' 10"  (1.778 m)  Wt 212 lb 8 oz (96.389 kg)  BMI 30.49 kg/m2 Manual blood pressure is 98/60. Constitutional: He is oriented to person, place, and time. He appears well-developed and well-nourished. No distress.  HENT: No nasal discharge.  Head: Normocephalic and atraumatic.  Eyes: Pupils are equal and round. Right eye exhibits no discharge. Left eye exhibits no discharge.  Neck: Normal range of motion. Neck supple. No JVD present. No thyromegaly present.  Cardiovascular: Bradycardic, irregular rhythm, normal heart sounds and. Exam reveals no gallop and no friction rub. No murmur heard.  Pulmonary/Chest: Effort normal and breath sounds normal. No stridor. No respiratory distress. He has no wheezes. He has no rales. He exhibits no tenderness.  Abdominal: Soft. Bowel sounds are normal. He exhibits no distension. There is no tenderness. There is no rebound and no guarding.  Musculoskeletal: Normal  range of motion. He exhibits no edema and no tenderness.  Neurological: He is alert and oriented to person, place, and time. Coordination normal.  Skin: Skin is warm and dry. No rash noted. He is not diaphoretic. No erythema. No pallor.  Psychiatric: He has a normal mood and affect. His behavior is normal. Judgment and thought content normal.       EKG: Sinus bradycardia with frequent PVCs and nonspecific T wave changes.   ASSESSMENT AND PLAN

## 2012-04-23 ENCOUNTER — Ambulatory Visit: Payer: Medicare Other | Admitting: Internal Medicine

## 2012-06-18 ENCOUNTER — Telehealth: Payer: Self-pay | Admitting: Cardiovascular Disease

## 2012-06-18 NOTE — Telephone Encounter (Signed)
Pt's wife called to say pt's BP and HR has been low (96/68, 75/49, HR=40,46BPM).  She says pt feels "weak" over the last few days.  Denies syncope or near syncope. Pt remains on bisoprolol 1/2 tablet daily. I advised wife to have pt hold this med to see if this helps symptoms, BP and HR and to call us in 2 days to report. Understanding verb.

## 2012-06-18 NOTE — Telephone Encounter (Signed)
Pt wife calling states that pt has had a low BP and HR over the last few days.  75/49 hr 46

## 2012-06-20 ENCOUNTER — Ambulatory Visit (INDEPENDENT_AMBULATORY_CARE_PROVIDER_SITE_OTHER): Payer: Medicare Other | Admitting: Cardiovascular Disease

## 2012-06-20 ENCOUNTER — Telehealth: Payer: Self-pay | Admitting: Cardiovascular Disease

## 2012-06-20 ENCOUNTER — Encounter: Payer: Self-pay | Admitting: Cardiovascular Disease

## 2012-06-20 VITALS — BP 100/60 | HR 40 | Ht 71.0 in | Wt 218.2 lb

## 2012-06-20 DIAGNOSIS — I498 Other specified cardiac arrhythmias: Secondary | ICD-10-CM

## 2012-06-20 DIAGNOSIS — I4949 Other premature depolarization: Secondary | ICD-10-CM

## 2012-06-20 DIAGNOSIS — Z01810 Encounter for preprocedural cardiovascular examination: Secondary | ICD-10-CM

## 2012-06-20 DIAGNOSIS — I493 Ventricular premature depolarization: Secondary | ICD-10-CM

## 2012-06-20 MED ORDER — MAGNESIUM OXIDE -MG SUPPLEMENT 400 (240 MG) MG PO TABS: 1.0000 | ORAL_TABLET | Freq: Two times a day (BID) | ORAL | Status: DC

## 2012-06-20 MED ORDER — METOPROLOL SUCCINATE ER 25 MG PO TB24: 25.0000 mg | ORAL_TABLET | Freq: Every day | ORAL | Status: DC

## 2012-06-20 NOTE — Telephone Encounter (Signed)
See other telephone note.  

## 2012-06-20 NOTE — Progress Notes (Signed)
HPI  Vincent [name] is a 71 y.o. male seen in followup for supraventricular tachycardia and ventricular bigeminy. He also has a history of cardiomyopathy with a previous ejection fraction of 35-40%. Most recently it was 50-55% on echocardiogram in may of this year. Catheterization from 2009 had demonstrated an ejection fraction of 40-45% range with a mild nonobstructive 20% plaque in his LAD.  He underwent right knee replacement in the fall of last year without complications. He was seen recently by gastroenterology for routine colonoscopy. He was noted to have frequent premature beats on physical exam. He underwent evaluation with a Holter monitor which showed frequent PVCs some in the form of bigeminy. 21% of beats were actually PVCs. His echocardiogram showed low normal LV systolic function with an ejection fraction of 50-55% with mild mitral regurgitation.  During his last visit, he was noted to be hypotensive but with minimal symptoms. Thus, ramipril was discontinued. He continued to take bisoprolol but recently his blood pressure started dropping again and he has not taken the medication over the last few days. He noticed increased fatigue and dyspnea when he is playing golf without palpitations. He also has been sweating profusely. He does not consume significant amount of caffeine. He denies any chest pain.   No Known Allergies   Current Outpatient Prescriptions on File Prior to Visit  Medication Sig Dispense Refill  . aspirin 81 MG EC tablet Take 81 mg by mouth daily.        [name] dutasteride (AVODART) 0.5 MG capsule Take 0.5 mg by mouth daily.        . Glucosamine-Chondroitin 1500-1200 MG/30ML LIQD Take by mouth daily.        . Multiple Vitamin (MULTIVITAMIN) tablet Take 1 tablet by mouth daily.        . naproxen sodium (ALEVE) 220 MG tablet Take 220 mg by mouth as needed.        [name] omeprazole (PRILOSEC) 20 MG capsule Take 20 mg by mouth daily.        . metoprolol succinate (TOPROL  XL) 25 MG 24 hr tablet Take 1 tablet (25 mg total) by mouth daily.  30 tablet  6     Past Medical History  Diagnosis Date  . Congestive heart failure, unspecified   . Dizziness and giddiness   . Secondary cardiomyopathy, unspecified   . Paroxysmal supraventricular tachycardia     SVT/PSVT/PAT     Past Surgical History  Procedure Date  . Hand surgery 1999  . Total knee arthroplasty      Family History  Problem Relation Age of Onset  . Coronary artery disease      family hx  . Heart failure      family hx - CHF     History   Social History  . Marital Status: Married    Spouse Name: N/A    Number of Children: N/A  . Years of Education: N/A   Occupational History  . Not on file.   Social History Main Topics  . Smoking status: Never Smoker   . Smokeless tobacco: Not on file   Comment: tobacco use - no  . Alcohol Use: No  . Drug Use: No  . Sexually Active: Not on file   Other Topics Concern  . Not on file   Social History Narrative   Retired, married, gets regular exercise.      PHYSICAL EXAM   BP 100/60  Pulse 40  Ht 5\' 11"  (1.803  m)  Wt 218 lb 4 oz (98.998 kg)  BMI 30.44 kg/m2 Constitutional: He is oriented to person, place, and time. He appears well-developed and well-nourished. No distress.  HENT: No nasal discharge.  Head: Normocephalic and atraumatic.  Eyes: Pupils are equal and round. Right eye exhibits no discharge. Left eye exhibits no discharge.  Neck: Normal range of motion. Neck supple. No JVD present. No thyromegaly present.  Cardiovascular: Normal rate, regular rhythm with premature beats, normal heart sounds and. Exam reveals no gallop and no friction rub. No murmur heard.  Pulmonary/Chest: Effort normal and breath sounds normal. No stridor. No respiratory distress. He has no wheezes. He has no rales. He exhibits no tenderness.  Abdominal: Soft. Bowel sounds are normal. He exhibits no distension. There is no tenderness. There is no  rebound and no guarding.  Musculoskeletal: Normal range of motion. He exhibits no edema and no tenderness.  Neurological: He is alert and oriented to person, place, and time. Coordination normal.  Skin: Skin is warm and dry. No rash noted. He is not diaphoretic. No erythema. No pallor.  Psychiatric: He has a normal mood and affect. His behavior is normal. Judgment and thought content normal.       EKG: Sinus rhythm with ventricular bigeminy nonspecific T wave changes   ASSESSMENT AND PLAN

## 2012-06-20 NOTE — Telephone Encounter (Signed)
I called pt's wife to assess pt's symptoms since holding bisoprolol She says pt is feeling much better She says his weakness has improved BP's are much better (111/78, 107/65) but HR's still in low 50's/40's I explained last holter revealed bigeminy/PVCs and HR pt is getting at home may be inaccurate I advised wife to continue to hold beta blocker and I will discuss with Dr. Kirke Corin and call her back  I discussed with Dr. Kirke Corin He suggests keep f/u with him at end of month and schedule consult with Dr. [name]  He also says ok to have pt continue to hold bisoprolol  I will call pt to advise

## 2012-06-20 NOTE — Telephone Encounter (Signed)
Get him for an office visit with me today or tomorrow.

## 2012-06-20 NOTE — Telephone Encounter (Signed)
Patient wants to tell you something else that his wife may not have mentioned earlier.

## 2012-06-20 NOTE — Telephone Encounter (Signed)
Pt called to say he also has been getting more fatigued easier and has noticed increasing diaphoresis.  He denies CP I explained Dr. Jari Sportsman plan to have pt see Dr. [name] He will continue to monitor BP/HR and symptoms and let us know if anything changes prior to appts scheduled  I will let Dr. Kirke Corin know of new symptoms of fatigue/diaphoresis

## 2012-06-20 NOTE — Assessment & Plan Note (Signed)
The patient continues to have ventricular bigeminy. He had a nuclear stress test in September 2012 which showed no evidence of ischemia. He seems to be having more symptoms of dyspnea and fatigue. He does not consume excessive amount of caffeine to explain this. His labs were unremarkable including normal thyroid function. I will try a different beta blocker and start metoprolol extended release 25 mg once daily. I will also add magnesium oxide 400 mg twice daily. It might be difficult to increase the dose of beta blocker due to low blood pressure. His echocardiogram showed that his ejection fraction is actually stable at 50-55%.  I will send to see Dr. [name] for a second opinion regarding antiarrhythmic medications.

## 2012-06-20 NOTE — Telephone Encounter (Signed)
Scheduled appt for today at 4pm

## 2012-06-20 NOTE — Assessment & Plan Note (Signed)
The patient's need to have colonoscopy done. He did have a stress test done last year which overall was unremarkable. Once the issues of PVCs and low blood pressure are resolved, he can undergo colonoscopy at an overall low risk.

## 2012-06-20 NOTE — Telephone Encounter (Signed)
Please read below I will make appt with Dr. [name] Let me know if I need to do anything differently Thanks

## 2012-06-20 NOTE — Patient Instructions (Addendum)
Stop Bisoprolol. Start Metoprolol ER 25 mg once daily.  Start Magnesium Oxide 400 mg twice daily.   Keep appointment in August.

## 2012-06-27 ENCOUNTER — Telehealth: Payer: Self-pay

## 2012-06-27 NOTE — Telephone Encounter (Signed)
Patient calling in regards to an appointment with Dr. [name] per Dr. Jari Sportsman request. Dr. [name] does not have anything available until Sept 5, 2013 which the patient is on the schedule for.  Do you feel this is too long for the patient to wait or is there any other recommendations?  The patient does have a follow up with  Dr. Kirke Corin on Aug. 27, 2013 which can be reschedule for after Sept 5, appointment with Dr. [name]. The patient states doing okay but just following through with his second opinion with Dr. [name]. Please advise.

## 2012-06-29 NOTE — Telephone Encounter (Signed)
September 5 is fine with Dr. [name]. This is not urgent. He can reschedule his appointment with me to one month after he sees Dr. [name].

## 2012-07-01 NOTE — Telephone Encounter (Signed)
Notified patient need to keep appointment with Dr. [name] on July 25, 2012 at 10:15 and we will reschedule to see Dr. Kirke Corin on Sept. 12, 2013.

## 2012-07-16 ENCOUNTER — Ambulatory Visit: Payer: Medicare Other | Admitting: Cardiovascular Disease

## 2012-07-22 ENCOUNTER — Telehealth: Payer: Self-pay | Admitting: Adult Health

## 2012-07-22 NOTE — Telephone Encounter (Signed)
Vincent [name] is a 71 year old patient of Dr. are recommended that he follows for CHF, PSVT, and secondary, cardiomyopathy. He is due to be seen by electrophysiology on Thursday September 5, but had some complaints of left-sided chest discomfort and left arm numbness.  He states that the symptoms began on 07/21/2012 where he is originally noticed that his left arm was tingling with difficulty moving it for several minutes He states that it hurt when he tried to move his arm and therefore had difficulty doing so. He said th the symptoms were transient but then he began to have some mild left-sided discomfortHe not seek medical attention, but  took an aspirin and rested and the symptoms went away.  Today he has been noticing some fatigue, but stated that after he ate breakfast he felt better. He has had no recurrent symptoms. I have advised him that if the discomfort or the tingling comes back that he should seek medical attention after taking his aspirin.   I  have left a message with the [place] office to have them call him in the a.m. to evaluate or have him come in as an add on. I hcontinues to have this discomfort or any recurrence of the same he is to report to the emergency room for evaluation and not wait for followup appointment.

## 2012-07-23 NOTE — Telephone Encounter (Signed)
Pt called back, says he has had no further spells since the 1 spell over the w/e. He denies any symptoms at the present time. He has appt with Dr. [name] scheduled for this Thursday 9/5. i advised him to call us sooner than appt should he develop symptoms again/they get worse. Understanding verb.

## 2012-07-23 NOTE — Telephone Encounter (Signed)
LMTCB

## 2012-07-23 NOTE — Telephone Encounter (Signed)
Per Lorin Picket PA wouldl like the nurse to call pt and see howpt is doing. See note from 9/2

## 2012-07-25 ENCOUNTER — Other Ambulatory Visit: Payer: Self-pay | Admitting: Internal Medicine

## 2012-07-25 ENCOUNTER — Ambulatory Visit (INDEPENDENT_AMBULATORY_CARE_PROVIDER_SITE_OTHER): Payer: Medicare Other | Admitting: Internal Medicine

## 2012-07-25 ENCOUNTER — Encounter: Payer: Self-pay | Admitting: Internal Medicine

## 2012-07-25 VITALS — BP 106/72 | HR 62 | Ht 71.0 in | Wt 212.5 lb

## 2012-07-25 DIAGNOSIS — R079 Chest pain, unspecified: Secondary | ICD-10-CM

## 2012-07-25 DIAGNOSIS — I471 Supraventricular tachycardia: Secondary | ICD-10-CM

## 2012-07-25 DIAGNOSIS — R209 Unspecified disturbances of skin sensation: Secondary | ICD-10-CM

## 2012-07-25 DIAGNOSIS — R202 Paresthesia of skin: Secondary | ICD-10-CM

## 2012-07-25 DIAGNOSIS — I4949 Other premature depolarization: Secondary | ICD-10-CM

## 2012-07-25 DIAGNOSIS — I493 Ventricular premature depolarization: Secondary | ICD-10-CM

## 2012-07-25 DIAGNOSIS — R200 Anesthesia of skin: Secondary | ICD-10-CM

## 2012-07-25 DIAGNOSIS — R29898 Other symptoms and signs involving the musculoskeletal system: Secondary | ICD-10-CM

## 2012-07-25 DIAGNOSIS — I429 Cardiomyopathy, unspecified: Secondary | ICD-10-CM

## 2012-07-25 LAB — TROPONIN I: Troponin-I: <0.02 ng/mL

## 2012-07-25 NOTE — Assessment & Plan Note (Signed)
None

## 2012-07-25 NOTE — Assessment & Plan Note (Signed)
The patient had transient left arm weakness and discomfort is accompanied part by left leg symptoms and some "chills" which   sounds like shaking  He has no known obstructive disease. We'll obtain a troponin because of the accompanying shortness of breath and otherwise pursue a Myoview. If the troponin is abnormal we will pursue catheterization. However, my suspicion is more likely that it is a neurological event. He had no carotid bruits. He has no residual symptoms. We'll undertake an MRI that normal we'll have to pursue underlying causes.

## 2012-07-25 NOTE — Patient Instructions (Addendum)
Your physician recommends that you have lab work today at [facility].  Dr. [name] wants you to have an MRI of brain. We will call you to arrange.  Dr. [name] wants you to have a stress test.  We will call you with date/time.

## 2012-07-25 NOTE — Assessment & Plan Note (Signed)
Significant interval improvement 

## 2012-07-25 NOTE — Assessment & Plan Note (Signed)
PVCs having narrow QRS morphology of 120-30 ms and axis of +180. The bigeminal pattern resolved and his symptoms with that. We will continue the routine started by Dr. Marcheta Grammes i.e. magnesium and metoprolol  Has left ventricular function has in fact improved, no other indication for therapy is present

## 2012-07-25 NOTE — Progress Notes (Signed)
ELECTROPHYSIOLOGY CONSULT NOTE  Patient ID: Vincent [name], MRN: [id number], DOB/AGE: 72/09/1941 72 y.o. Admit date: (Not on file) Date of Consult: 07/25/2012  Primary Physician: [name], MD Primary Cardiologist: MA  Chief Complaint: Bigmininy      HPI Vincent [name] is a 71 y.o. male seen in followup for supraventricular tachycardia. He also has a history of cardiomyopathy  with a recent worsening of LV function measured by echo in December at 35-40%.  Echo and catheterization from 2009 had demonstrated an ejection fraction of 40-45% range with a mild nonobstructive 20% plaque in his LAD.   Echocardiogram May 2013 demonstrated ejection fraction 20-25% with mild MR and LAE  He was seen earlier this summer in my absence because of weakness which correlated temporally with the identification of bigeminy with an unusual PVC pattern (see below) which improved significantly with a different beta blocker and the introduction of magnesium.4 nights ago as he was going to bed he developed a clumsiness and a tingling in his left hand which resulted in him knocking over class because he couldn't stand adequately. He then developed tingling proximal thigh. Accompany shortness of breath. No chest pain. It is also accompanied "of chills" and shaking and flushed his left lower extremity. They were going to go to the hospital   But symptoms abated and he went home  By hte next am his symptoms were totally resolved  Past Medical History  Diagnosis Date  . Congestive heart failure, unspecified   . Dizziness and giddiness   . Secondary cardiomyopathy, unspecified   . Paroxysmal supraventricular tachycardia     SVT/PSVT/PAT      Surgical History:  Past Surgical History  Procedure Date  . Hand surgery 1999  . Total knee arthroplasty      Home Meds: Prior to Admission medications   Medication Sig Start Date End Date Taking? Authorizing Provider  aspirin 81 MG EC tablet Take 81  mg by mouth daily.     Yes Historical Provider, MD  dutasteride (AVODART) 0.5 MG capsule Take 0.5 mg by mouth daily.     Yes Historical Provider, MD  Glucosamine-Chondroitin 1500-1200 MG/30ML LIQD Take by mouth daily.     Yes Historical Provider, MD  Magnesium Oxide 400 (240 MG) MG TABS Take 1 tablet by mouth 2 (two) times daily. 06/20/12  Yes Iran Ouch, MD  metoprolol succinate (TOPROL XL) 25 MG 24 hr tablet Take 1 tablet (25 mg total) by mouth daily. 06/20/12 06/20/13 Yes Iran Ouch, MD  Multiple Vitamin (MULTIVITAMIN) tablet Take 1 tablet by mouth daily.     Yes Historical Provider, MD  naproxen sodium (ALEVE) 220 MG tablet Take 220 mg by mouth as needed.     Yes Historical Provider, MD  omeprazole (PRILOSEC) 20 MG capsule Take 20 mg by mouth daily.     Yes Historical Provider, MD      Allergies: No Known Allergies  History   Social History  . Marital Status: Married    Spouse Name: N/A    Number of Children: N/A  . Years of Education: N/A   Occupational History  . Not on file.   Social History Main Topics  . Smoking status: Never Smoker   . Smokeless tobacco: Not on file   Comment: tobacco use - no  . Alcohol Use: No  . Drug Use: No  . Sexually Active: Not on file   Other Topics Concern  . Not on file   Social History Narrative  Retired, married, gets regular exercise.     Family History  Problem Relation Age of Onset  . Coronary artery disease      family hx  . Heart failure      family hx - CHF       Physical Exam:  Blood pressure 106/72, pulse 62, height 5\' 11"  (1.803 m), weight 212 lb 8 oz (96.389 kg). General: Well developed, well nourished male in no acute distress. Head: Normocephalic, atraumatic, sclera non-icteric, no xanthomas, nares are without discharge. Lymph Nodes:  none Back:  , no CVA tendersness Neck: Negative for carotid bruits. JVD not elevated. Lungs: Clear bilaterally to auscultation without wheezes, rales, or rhonchi.  Breathing is unlabored. Heart: RRR with S1 S2. No  murmur , rubs, or gallops appreciated. Abdomen: Soft, non-tender, non-distended with normoactive bowel sounds. No hepatomegaly. No rebound/guarding. No obvious abdominal masses. Msk:  Strength and tone appear normal for age. Extremities: No clubbing or cyanosis. No  edema.  Distal pedal pulses are 2+ and equal bilaterally. Skin: Warm and Dry Neuro: Alert and oriented X 3. CN III-XII intact Grossly normal sensory and motor function .no weakness in hand or arm n L Psych:  Responds to questions appropriately with a normal affect.        EKG:  Sinus rhythm at 62 Interval 17/10/44 No acute changes.   Tracing of August 1 bigeminy with a PVC morphology of left bundle axis of +180   Assessment and Plan:  Sherryl Manges

## 2012-07-26 ENCOUNTER — Telehealth: Payer: Self-pay

## 2012-07-26 ENCOUNTER — Other Ambulatory Visit: Payer: Self-pay | Admitting: Internal Medicine

## 2012-07-26 ENCOUNTER — Ambulatory Visit: Payer: Self-pay | Admitting: Internal Medicine

## 2012-07-26 DIAGNOSIS — R202 Paresthesia of skin: Secondary | ICD-10-CM

## 2012-07-26 DIAGNOSIS — R200 Anesthesia of skin: Secondary | ICD-10-CM

## 2012-07-26 NOTE — Telephone Encounter (Signed)
Verbal instructions given to pt re: MRI brain scheduled for this am as well as stress myoview scheduled for 08/01/12.  I explained to pt Dr. Kirke Corin unable to perform stress test until 9/18 (b/c of clinic schedule, etc). Pt ok with having Dr. Mariah Milling perform stress test. Also informed pt of normal troponin results.

## 2012-08-01 ENCOUNTER — Ambulatory Visit: Payer: Self-pay | Admitting: Internal Medicine

## 2012-08-01 ENCOUNTER — Ambulatory Visit: Payer: Medicare Other | Admitting: Cardiovascular Disease

## 2012-08-01 DIAGNOSIS — R079 Chest pain, unspecified: Secondary | ICD-10-CM

## 2012-08-02 ENCOUNTER — Telehealth: Payer: Self-pay | Admitting: Cardiovascular Disease

## 2012-08-02 NOTE — Telephone Encounter (Signed)
Pt calling to find out about his stress test results.  Please call to advise.

## 2012-08-05 NOTE — Telephone Encounter (Signed)
Preliminary results given to pt "low risk scan"

## 2012-08-15 ENCOUNTER — Ambulatory Visit (INDEPENDENT_AMBULATORY_CARE_PROVIDER_SITE_OTHER): Payer: Medicare Other | Admitting: Cardiovascular Disease

## 2012-08-15 ENCOUNTER — Encounter: Payer: Self-pay | Admitting: Cardiovascular Disease

## 2012-08-15 VITALS — BP 112/68 | HR 61 | Ht 71.0 in | Wt 213.5 lb

## 2012-08-15 DIAGNOSIS — I493 Ventricular premature depolarization: Secondary | ICD-10-CM

## 2012-08-15 DIAGNOSIS — I509 Heart failure, unspecified: Secondary | ICD-10-CM

## 2012-08-15 DIAGNOSIS — I4949 Other premature depolarization: Secondary | ICD-10-CM

## 2012-08-15 DIAGNOSIS — Z01810 Encounter for preprocedural cardiovascular examination: Secondary | ICD-10-CM | POA: Insufficient documentation

## 2012-08-15 DIAGNOSIS — I471 Supraventricular tachycardia: Secondary | ICD-10-CM

## 2012-08-15 NOTE — Assessment & Plan Note (Signed)
He is currently not having any PVCs. His blood pressure also improved after switching him to Toprol. I recommend continuing magnesium supplement on Toprol 25 mg once daily. Recent cardiac evaluation including a nuclear stress test showed no evidence of ischemia with normal ejection fraction.

## 2012-08-15 NOTE — Progress Notes (Signed)
HPI  Vincent [name] is a 71 y.o. male seen in followup for supraventricular tachycardia and ventricular bigeminy. He also has a history of cardiomyopathy with a previous ejection fraction of 35-40%. Most recently it was 50-55% on echocardiogram in May of this year. Catheterization from 2009 had demonstrated an ejection fraction of 40-45% range with a mild nonobstructive 20% plaque in his LAD.  He underwent right knee replacement in the fall of last year without complications. He was seen a few months ago by gastroenterology for routine colonoscopy. He was noted to have frequent premature beats on physical exam. He underwent evaluation with a Holter monitor which showed frequent PVCs some in the form of bigeminy. 21% of beats were actually PVCs. His echocardiogram showed low normal LV systolic function with an ejection fraction of 50-55% with mild mitral regurgitation.  He also had issues with hypotension. Thus, ramipril was discontinued. I switched him from bisoprolol to Toprol and added magnesium supplement twice daily. Since then, he had significant improvement in his symptoms. He was seen recently by Dr. [name]. At that time, he complained of left arm numbness. He underwent a treadmill nuclear stress test which showed no evidence of ischemia with normal ejection fraction. He also had a brain MRI which was normal.  No Known Allergies   Current Outpatient Prescriptions on File Prior to Visit  Medication Sig Dispense Refill  . aspirin 81 MG EC tablet Take 81 mg by mouth daily.        [name] dutasteride (AVODART) 0.5 MG capsule Take 0.5 mg by mouth daily.        . Glucosamine-Chondroitin 1500-1200 MG/30ML LIQD Take by mouth daily.        . Magnesium Oxide 400 (240 MG) MG TABS Take 1 tablet by mouth 2 (two) times daily.  60 tablet  6  . metoprolol succinate (TOPROL XL) 25 MG 24 hr tablet Take 1 tablet (25 mg total) by mouth daily.  30 tablet  6  . Multiple Vitamin (MULTIVITAMIN) tablet Take 1  tablet by mouth daily.        . naproxen sodium (ALEVE) 220 MG tablet Take 220 mg by mouth as needed.        [name] omeprazole (PRILOSEC) 20 MG capsule Take 20 mg by mouth daily.           Past Medical History  Diagnosis Date  . Congestive heart failure, unspecified   . Dizziness and giddiness   . Secondary cardiomyopathy, unspecified   . Paroxysmal supraventricular tachycardia     SVT/PSVT/PAT     Past Surgical History  Procedure Date  . Hand surgery 1999  . Total knee arthroplasty      Family History  Problem Relation Age of Onset  . Coronary artery disease      family hx  . Heart failure      family hx - CHF     History   Social History  . Marital Status: Married    Spouse Name: N/A    Number of Children: N/A  . Years of Education: N/A   Occupational History  . Not on file.   Social History Main Topics  . Smoking status: Never Smoker   . Smokeless tobacco: Not on file   Comment: tobacco use - no  . Alcohol Use: No  . Drug Use: No  . Sexually Active: Not on file   Other Topics Concern  . Not on file   Social History Narrative  Retired, married, gets regular exercise.     PHYSICAL EXAM   BP 112/68  Pulse 61  Ht 5\' 11"  (1.803 m)  Wt 213 lb 8 oz (96.843 kg)  BMI 29.78 kg/m2 Constitutional: He is oriented to person, place, and time. He appears well-developed and well-nourished. No distress.  HENT: No nasal discharge.  Head: Normocephalic and atraumatic.  Eyes: Pupils are equal and round. Right eye exhibits no discharge. Left eye exhibits no discharge.  Neck: Normal range of motion. Neck supple. No JVD present. No thyromegaly present.  Cardiovascular: Normal rate, regular rhythm, normal heart sounds and. Exam reveals no gallop and no friction rub. No murmur heard.  Pulmonary/Chest: Effort normal and breath sounds normal. No stridor. No respiratory distress. He has no wheezes. He has no rales. He exhibits no tenderness.  Abdominal: Soft. Bowel sounds  are normal. He exhibits no distension. There is no tenderness. There is no rebound and no guarding.  Musculoskeletal: Normal range of motion. He exhibits no edema and no tenderness.  Neurological: He is alert and oriented to person, place, and time. Coordination normal.  Skin: Skin is warm and dry. No rash noted. He is not diaphoretic. No erythema. No pallor.  Psychiatric: He has a normal mood and affect. His behavior is normal. Judgment and thought content normal.       EKG: Sinus  Rhythm  Low voltage in limb leads.   ABNORMAL    ASSESSMENT AND PLAN

## 2012-08-15 NOTE — Assessment & Plan Note (Signed)
The patient needs to have colonoscopy done. He is at an overall low risk. No further cardiac testing is needed. Even if he is having frequent PVCs or ventricular bigeminy at the day of colonoscopy, he can proceed given that his recent cardiac testing has been unremarkable.

## 2012-08-15 NOTE — Patient Instructions (Addendum)
Continue same medications.  Follow up in 6 months.  

## 2012-08-19 ENCOUNTER — Other Ambulatory Visit: Payer: Self-pay | Admitting: Internal Medicine

## 2012-08-19 DIAGNOSIS — R079 Chest pain, unspecified: Secondary | ICD-10-CM

## 2012-09-03 DIAGNOSIS — N401 Enlarged prostate with lower urinary tract symptoms: Secondary | ICD-10-CM | POA: Insufficient documentation

## 2012-09-03 DIAGNOSIS — E785 Hyperlipidemia, unspecified: Secondary | ICD-10-CM | POA: Insufficient documentation

## 2012-09-03 DIAGNOSIS — R3915 Urgency of urination: Secondary | ICD-10-CM | POA: Insufficient documentation

## 2012-09-03 DIAGNOSIS — R972 Elevated prostate specific antigen [PSA]: Secondary | ICD-10-CM | POA: Insufficient documentation

## 2012-09-11 ENCOUNTER — Other Ambulatory Visit: Payer: Self-pay | Admitting: Cardiovascular Disease

## 2012-09-11 MED ORDER — METOPROLOL SUCCINATE ER 25 MG PO TB24: 25.0000 mg | ORAL_TABLET | Freq: Every day | ORAL | Status: DC

## 2012-09-11 NOTE — Telephone Encounter (Signed)
Refilled Metoprolol

## 2012-09-12 ENCOUNTER — Other Ambulatory Visit: Payer: Self-pay

## 2012-09-12 MED ORDER — METOPROLOL SUCCINATE ER 25 MG PO TB24: 25.0000 mg | ORAL_TABLET | Freq: Every day | ORAL | Status: DC

## 2012-09-12 NOTE — Telephone Encounter (Signed)
Refill sent for metoprolol.  

## 2012-10-25 ENCOUNTER — Ambulatory Visit: Payer: Self-pay | Admitting: Gastroenterology

## 2012-12-05 ENCOUNTER — Other Ambulatory Visit: Payer: Self-pay | Admitting: Internal Medicine

## 2012-12-05 NOTE — Telephone Encounter (Signed)
Refill request for ramipril.  Medication was d/c on 04/18/12.  Please advise.

## 2013-01-04 ENCOUNTER — Other Ambulatory Visit: Payer: Self-pay

## 2013-01-21 ENCOUNTER — Ambulatory Visit (INDEPENDENT_AMBULATORY_CARE_PROVIDER_SITE_OTHER): Payer: Medicare Other | Admitting: Cardiovascular Disease

## 2013-01-21 ENCOUNTER — Encounter: Payer: Self-pay | Admitting: Cardiovascular Disease

## 2013-01-21 VITALS — BP 106/70 | HR 67 | Ht 71.0 in | Wt 219.5 lb

## 2013-01-21 DIAGNOSIS — I4949 Other premature depolarization: Secondary | ICD-10-CM

## 2013-01-21 DIAGNOSIS — I471 Supraventricular tachycardia: Secondary | ICD-10-CM

## 2013-01-21 DIAGNOSIS — I509 Heart failure, unspecified: Secondary | ICD-10-CM

## 2013-01-21 DIAGNOSIS — I493 Ventricular premature depolarization: Secondary | ICD-10-CM

## 2013-01-21 NOTE — Patient Instructions (Addendum)
Continue same medications  Follow up in 1 year

## 2013-01-21 NOTE — Assessment & Plan Note (Signed)
He has no recurrent palpitations. His EKG shows normal sinus rhythm with no premature beats. Continue current treatment.

## 2013-01-21 NOTE — Assessment & Plan Note (Signed)
He has no symptoms suggestive of recurrent arrhythmia. He is mildly bradycardic but overall he is asymptomatic. Thus, I will continue current dose of Toprol 25 mg once daily.

## 2013-01-21 NOTE — Progress Notes (Signed)
HPI  Vincent [name] is a 72 y.o. male seen in followup for supraventricular tachycardia and ventricular bigeminy. He also has a history of cardiomyopathy with a previous ejection fraction of 35-40%. Most recently it was 50-55% on echocardiogram in May of 2013. Catheterization from 2009 had demonstrated an ejection fraction of 40-45% range with a mild nonobstructive 20% plaque in his LAD.  He was noted to have frequent premature beats on physical exam last year. He underwent evaluation with a Holter monitor which showed frequent PVCs some in the form of bigeminy. 21% of beats were actually PVCs. His echocardiogram showed low normal LV systolic function with an ejection fraction of 50-55% with mild mitral regurgitation.  He was switched from bisoprolol to Toprol and added magnesium supplement twice daily. Since then, he had significant improvement in his symptoms. He was seen  by Dr. [name] last year. At that time, he complained of left arm numbness. He underwent a treadmill nuclear stress test which showed no evidence of ischemia with normal ejection fraction.  He is doing very well with no recurrent palpitations, dyspnea or dizziness. His heart rate most of the time is between 50-60 but he is asymptomatic.  No Known Allergies   Current Outpatient Prescriptions on File Prior to Visit  Medication Sig Dispense Refill  . aspirin 81 MG EC tablet Take 81 mg by mouth daily.        [name] dutasteride (AVODART) 0.5 MG capsule Take 0.5 mg by mouth every other day.       . Glucosamine-Chondroitin 1500-1200 MG/30ML LIQD Take by mouth daily.        . Magnesium Oxide 400 (240 MG) MG TABS Take 1 tablet by mouth 2 (two) times daily.  60 tablet  6  . metoprolol succinate (TOPROL XL) 25 MG 24 hr tablet Take 1 tablet (25 mg total) by mouth daily.  90 tablet  3  . Multiple Vitamin (MULTIVITAMIN) tablet Take 1 tablet by mouth daily.        . naproxen sodium (ALEVE) 220 MG tablet Take 220 mg by mouth as needed.         [name] omeprazole (PRILOSEC) 20 MG capsule Take 20 mg by mouth daily.         No current facility-administered medications on file prior to visit.     Past Medical History  Diagnosis Date  . Congestive heart failure, unspecified   . Dizziness and giddiness   . Secondary cardiomyopathy, unspecified   . Paroxysmal supraventricular tachycardia     SVT/PSVT/PAT     Past Surgical History  Procedure Laterality Date  . Hand surgery  1999  . Total knee arthroplasty       Family History  Problem Relation Age of Onset  . Coronary artery disease      family hx  . Heart failure      family hx - CHF     History   Social History  . Marital Status: Married    Spouse Name: N/A    Number of Children: N/A  . Years of Education: N/A   Occupational History  . Not on file.   Social History Main Topics  . Smoking status: Never Smoker   . Smokeless tobacco: Not on file     Comment: tobacco use - no  . Alcohol Use: No  . Drug Use: No  . Sexually Active: Not on file   Other Topics Concern  . Not on file   Social History  Narrative   Retired, married, gets regular exercise.     PHYSICAL EXAM   BP 106/70  Pulse 67  Ht 5\' 11"  (1.803 m)  Wt 219 lb 8 oz (99.565 kg)  BMI 30.63 kg/m2 Constitutional: He is oriented to person, place, and time. He appears well-developed and well-nourished. No distress.  HENT: No nasal discharge.  Head: Normocephalic and atraumatic.  Eyes: Pupils are equal and round. Right eye exhibits no discharge. Left eye exhibits no discharge.  Neck: Normal range of motion. Neck supple. No JVD present. No thyromegaly present.  Cardiovascular: Normal rate, regular rhythm, normal heart sounds and. Exam reveals no gallop and no friction rub. No murmur heard.  Pulmonary/Chest: Effort normal and breath sounds normal. No stridor. No respiratory distress. He has no wheezes. He has no rales. He exhibits no tenderness.  Abdominal: Soft. Bowel sounds are normal. He  exhibits no distension. There is no tenderness. There is no rebound and no guarding.  Musculoskeletal: Normal range of motion. He exhibits no edema and no tenderness.  Neurological: He is alert and oriented to person, place, and time. Coordination normal.  Skin: Skin is warm and dry. No rash noted. He is not diaphoretic. No erythema. No pallor.  Psychiatric: He has a normal mood and affect. His behavior is normal. Judgment and thought content normal.       EKG: Sinus  Rhythm  WITHIN NORMAL LIMITS   ASSESSMENT AND PLAN

## 2013-06-25 ENCOUNTER — Other Ambulatory Visit: Payer: Self-pay

## 2013-08-09 ENCOUNTER — Other Ambulatory Visit: Payer: Self-pay | Admitting: Cardiovascular Disease

## 2013-08-11 ENCOUNTER — Other Ambulatory Visit: Payer: Self-pay | Admitting: *Deleted

## 2013-08-11 MED ORDER — METOPROLOL SUCCINATE ER 25 MG PO TB24: 25.0000 mg | ORAL_TABLET | Freq: Every day | ORAL | Status: DC

## 2013-08-11 NOTE — Telephone Encounter (Signed)
[facility]

## 2013-09-25 ENCOUNTER — Other Ambulatory Visit: Payer: Self-pay

## 2013-11-20 HISTORY — PX: APPENDECTOMY: SHX54

## 2014-01-23 ENCOUNTER — Ambulatory Visit: Payer: Medicare Other | Admitting: Cardiovascular Disease

## 2014-02-06 ENCOUNTER — Ambulatory Visit: Payer: Medicare Other | Admitting: Cardiovascular Disease

## 2014-02-06 ENCOUNTER — Ambulatory Visit (INDEPENDENT_AMBULATORY_CARE_PROVIDER_SITE_OTHER): Payer: Medicare Other | Admitting: Cardiovascular Disease

## 2014-02-06 ENCOUNTER — Encounter: Payer: Self-pay | Admitting: Cardiovascular Disease

## 2014-02-06 VITALS — BP 106/72 | HR 61 | Ht 71.0 in | Wt 224.2 lb

## 2014-02-06 DIAGNOSIS — I493 Ventricular premature depolarization: Secondary | ICD-10-CM

## 2014-02-06 DIAGNOSIS — I4949 Other premature depolarization: Secondary | ICD-10-CM

## 2014-02-06 DIAGNOSIS — I471 Supraventricular tachycardia: Secondary | ICD-10-CM

## 2014-02-06 MED ORDER — METOPROLOL SUCCINATE ER 25 MG PO TB24: 25.0000 mg | ORAL_TABLET | Freq: Every day | ORAL | Status: DC

## 2014-02-06 NOTE — Progress Notes (Signed)
HPI  Vincent [name] is a 74 y.o. male seen in followup for supraventricular tachycardia and ventricular bigeminy. He also has a history of cardiomyopathy with a previous ejection fraction of 35-40%. Most recently it was 50-55% on echocardiogram in May of 2013. Catheterization from 2009 had demonstrated an ejection fraction of 40-45% range with a mild nonobstructive 20% plaque in his LAD.  He was noted to have frequent premature beats on physical in 2013. He underwent evaluation with a Holter monitor which showed frequent PVCs some in the form of bigeminy. 21% of beats were actually PVCs. His echocardiogram showed low normal LV systolic function with an ejection fraction of 50-55% with mild mitral regurgitation.  He was switched from bisoprolol to Toprol and added magnesium supplement twice daily. Since then, he had significant improvement in his symptoms.A treadmill nuclear stress test which showed no evidence of ischemia with normal ejection fraction.  He is doing very well with no recurrent palpitations, dyspnea or dizziness. His heart rate most of the time is between 50-60 but he is asymptomatic.  No Known Allergies   Current Outpatient Prescriptions on File Prior to Visit  Medication Sig Dispense Refill  . aspirin 81 MG EC tablet Take 81 mg by mouth daily.        [name] dutasteride (AVODART) 0.5 MG capsule Take 0.5 mg by mouth every other day.       . Glucosamine-Chondroitin 1500-1200 MG/30ML LIQD Take by mouth daily.        . Magnesium Oxide 400 (240 MG) MG TABS Take 1 tablet by mouth 2 (two) times daily.  60 tablet  6  . Multiple Vitamin (MULTIVITAMIN) tablet Take 1 tablet by mouth daily.        . naproxen sodium (ALEVE) 220 MG tablet Take 220 mg by mouth as needed.        [name] omeprazole (PRILOSEC) 20 MG capsule Take 20 mg by mouth daily.         No current facility-administered medications on file prior to visit.     Past Medical History  Diagnosis Date  . Congestive heart  failure, unspecified   . Dizziness and giddiness   . Secondary cardiomyopathy, unspecified   . Paroxysmal supraventricular tachycardia     SVT/PSVT/PAT     Past Surgical History  Procedure Laterality Date  . Hand surgery  1999  . Total knee arthroplasty       Family History  Problem Relation Age of Onset  . Coronary artery disease      family hx  . Heart failure      family hx - CHF  . Heart attack Father 59     History   Social History  . Marital Status: Married    Spouse Name: N/A    Number of Children: N/A  . Years of Education: N/A   Occupational History  . Not on file.   Social History Main Topics  . Smoking status: Never Smoker   . Smokeless tobacco: Not on file     Comment: tobacco use - no  . Alcohol Use: No  . Drug Use: No  . Sexual Activity: Not on file   Other Topics Concern  . Not on file   Social History Narrative   Retired, married, gets regular exercise.     PHYSICAL EXAM   BP 106/72  Pulse 61  Ht 5\' 11"  (1.803 m)  Wt 224 lb 4 oz (101.719 kg)  BMI 31.29 kg/m2 Constitutional:  He is oriented to person, place, and time. He appears well-developed and well-nourished. No distress.  HENT: No nasal discharge.  Head: Normocephalic and atraumatic.  Eyes: Pupils are equal and round. Right eye exhibits no discharge. Left eye exhibits no discharge.  Neck: Normal range of motion. Neck supple. No JVD present. No thyromegaly present.  Cardiovascular: Normal rate, regular rhythm, normal heart sounds and. Exam reveals no gallop and no friction rub. No murmur heard.  Pulmonary/Chest: Effort normal and breath sounds normal. No stridor. No respiratory distress. He has no wheezes. He has no rales. He exhibits no tenderness.  Abdominal: Soft. Bowel sounds are normal. He exhibits no distension. There is no tenderness. There is no rebound and no guarding.  Musculoskeletal: Normal range of motion. He exhibits no edema and no tenderness.  Neurological: He is  alert and oriented to person, place, and time. Coordination normal.  Skin: Skin is warm and dry. No rash noted. He is not diaphoretic. No erythema. No pallor.  Psychiatric: He has a normal mood and affect. His behavior is normal. Judgment and thought content normal.       EKG: Sinus  Rhythm  WITHIN NORMAL LIMITS   ASSESSMENT AND PLAN

## 2014-02-06 NOTE — Patient Instructions (Signed)
Continue same medications.   Your physician wants you to follow-up in: 12 months.  You will receive a reminder letter in the mail two months in advance. If you don't receive a letter, please call our office to schedule the follow-up appointment.  

## 2014-02-06 NOTE — Assessment & Plan Note (Signed)
He is doing very well with no evidence of recurrent arrhythmia. Continue current medications. He has no symptoms of angina or heart failure.

## 2014-02-06 NOTE — Assessment & Plan Note (Signed)
No recurrent arrhythmia over the last year.

## 2014-06-23 ENCOUNTER — Encounter: Payer: Self-pay | Admitting: Nurse Practitioner

## 2014-06-23 ENCOUNTER — Telehealth: Payer: Self-pay

## 2014-06-23 ENCOUNTER — Other Ambulatory Visit: Payer: Self-pay | Admitting: Nurse Practitioner

## 2014-06-23 ENCOUNTER — Ambulatory Visit (INDEPENDENT_AMBULATORY_CARE_PROVIDER_SITE_OTHER): Payer: Medicare Other | Admitting: Nurse Practitioner

## 2014-06-23 VITALS — BP 110/80 | HR 62 | Ht 71.0 in | Wt 215.1 lb

## 2014-06-23 DIAGNOSIS — I493 Ventricular premature depolarization: Secondary | ICD-10-CM

## 2014-06-23 DIAGNOSIS — I471 Supraventricular tachycardia, unspecified: Secondary | ICD-10-CM

## 2014-06-23 DIAGNOSIS — R000 Tachycardia, unspecified: Secondary | ICD-10-CM

## 2014-06-23 DIAGNOSIS — R002 Palpitations: Secondary | ICD-10-CM

## 2014-06-23 DIAGNOSIS — I428 Other cardiomyopathies: Secondary | ICD-10-CM

## 2014-06-23 DIAGNOSIS — I4949 Other premature depolarization: Secondary | ICD-10-CM

## 2014-06-23 NOTE — Progress Notes (Signed)
Patient Name: Vincent [name] Date of Encounter: 06/23/2014  Primary Care Provider:  [name], MD Primary Cardiologist:  Judie Petit. Kirke Corin, MD   Patient Profile  73 y/o male with a h/o NICM with subsequent LVEF improvement, along with palpitations and PVC's who presents 2/2 more frequent tachypalpitations.  Problem List   Past Medical History  Diagnosis Date  . History of systolic CHF     a. Prev EF as low as 35-40%;  b. 03/2012 Echo: EF 50-55%, Gr 1 DD, mild MR.  . Dizziness and giddiness   . History of NICM     a. Prev EF as low as 35-40%;  b. 03/2012 Echo: EF 50-55%, Gr 1 DD, mild MR.  . Paroxysmal supraventricular tachycardia     a. SVT/PAT  . Non-obstructive CAD     a. 2009 nonobs dzs, LAD 20, EF 40-45%;  b. 07/2012 Ex MV: EF 55%, freq pvc's, no ischemia/infarct.  [name] PVC's (premature ventricular contractions)     a. Asymptomatic - managed with Toprol and Mg.   Past Surgical History  Procedure Laterality Date  . Hand surgery  1999  . Total knee arthroplasty      Allergies  No Known Allergies  HPI  73 y/o male with the above problem list.  He had a h/o NICM with EF previously documented as low as 35-40%, with non-obstructive CAD on cath in 2009.  F/U echo in 2013, showed normalization of LV fxn.  He also has a h/o tachypalpitations and SVT, along with frequent PVC's, which had been well managed with Toprol and oral Magnesium therapy.  He says that in general, he may experience an episode of palpitations once every few months but over the past 2-3 wks, tachypalpitations are occurring several times/wk.  Ss occur both at rest and with exertion and have been unpredictable in nature.  An episode typically starts out feeling like a headache is coming on and then he develops palpitations associated with dyspnea.  He has documented HR's in the mid 140's earlier this week.  Tachycardia persists for about 30-45 mins and resolve spontaneously.  His baseline blood pressures are  typically in the 90's and with palpitations, he has had recordings in the 80's.  As a result, he is unable to take additional toprol when a spell hits him.  He denies chest pain, dyspnea, pnd, orthopnea, n, v, syncope, edema, weight gain, or early satiety.   Home Medications  Prior to Admission medications   Medication Sig Start Date End Date Taking? Authorizing Provider  aspirin 81 MG EC tablet Take 81 mg by mouth daily.     Yes Historical Provider, MD  dutasteride (AVODART) 0.5 MG capsule Take 0.5 mg by mouth every other day.    Yes Historical Provider, MD  Glucosamine-Chondroitin 1500-1200 MG/30ML LIQD Take by mouth daily.     Yes Historical Provider, MD  Magnesium Oxide 400 (240 MG) MG TABS Take 1 tablet by mouth 2 (two) times daily. 06/20/12  Yes Iran Ouch, MD  meloxicam (MOBIC) 15 MG tablet Take 15 mg by mouth daily.   Yes Historical Provider, MD  metoprolol succinate (TOPROL XL) 25 MG 24 hr tablet Take 1 tablet (25 mg total) by mouth daily. 02/06/14 02/06/15 Yes Iran Ouch, MD  Multiple Vitamin (MULTIVITAMIN) tablet Take 1 tablet by mouth daily.     Yes Historical Provider, MD  naproxen sodium (ALEVE) 220 MG tablet Take 220 mg by mouth as needed.     Yes Historical Provider,  MD  omeprazole (PRILOSEC) 20 MG capsule Take 20 mg by mouth daily.     Yes Historical Provider, MD   Review of Systems  Tachypalpitations associated with diaphoresis and headache as outlined above.  He has felt dizzy on one or two occasions while playing golf recently.  All other systems reviewed and are otherwise negative except as noted above.    Physical Exam  Blood pressure 110/80, pulse 62, height 5\' 11"  (1.803 m), weight 215 lb 1.9 oz (97.578 kg).  General: Pleasant, NAD Psych: Normal affect. Neuro: Alert and oriented X 3. Moves all extremities spontaneously. HEENT: Normal  Neck: Supple without bruits or JVD. Lungs:  Resp regular and unlabored, CTA. Heart: RRR no s3, s4, or murmurs. Abdomen:  Soft, non-tender, non-distended, BS + x 4.  Extremities: No clubbing, cyanosis or edema. DP/PT/Radials 2+ and equal bilaterally.  Accessory Clinical Findings  ECG - SB, 59, PVC, no acute ST/T changes.  Assessment & Plan  1.  Palpitations & Tachycardia; H/O SVT:  Over the past 2-3 wks, he has been having more frequent episodes of tachypalpitations associated with diaphoresis and headache.  He did have at least one episode of dizziness while playing golf on a hot day but interestingly, did not have palpitations at that time.  He has documented rates of 122, 143, and 144 during periods of tachycardia.  We will check a bmet, Mg, TSH, and echo, and have also arranged for a 21 day event monitor to further objectify what is happening during these spells.  Due to baseline bradycardia and relative hypotension, with blood pressures often in the 90's, I am unable to titrate his toprol at all.  If his monitor reveals recurrent SVT, he will likely be best served by EP referral for RFCA consideration.  If however it shows afib, we will need to discuss antiarrhythmic and anticoagulant therapy.  2.  PVCs:  Await monitoring.  ? If tachycardia could be slow VT.  He has not had presyncope assoc with tachycardia.  Labs and echo pending.  3.  H/O NICM:  Last EF 50-55% in 2013.  Repeat echo as above.  4.  Dispo:  Labs, echo, and monitor as above.  F/U in 2 wks or sooner if monitoring reveals significant arrhythmia.  Nicolasa Duckinghristopher Berge, NP 06/23/2014, 4:12 PM

## 2014-06-23 NOTE — Telephone Encounter (Signed)
Spoke w/ pt.  He reports that he has episodes that he "feels like he's going to have a headache, but doesn't have one" and his HR is elevated. Reports that his BP usually runs 80-90/50-60 and his HR is usually in the 50s. Reports that 3 times in the last 3 weeks, his HR has been elevated at 122, 146, 143. Pt denies any symptoms other than the feeling of getting a HA. Advised pt that due to his high HR, I would like for him to be seen to at least have an EKG. Pt would like this to be addressed and his sched to see Ward Givens, NP today at 2:15.

## 2014-06-23 NOTE — Telephone Encounter (Signed)
Pt states at times he feels like he is going to have a headache, feels weak. HR is "higher than it should be". This a.m. BP 87/69, HR 143. Aug 2-99/82, HR 146. July 19- 76/58 HR 122.  Just now 91/66 HR 53. Please call.

## 2014-06-23 NOTE — Patient Instructions (Signed)
We will draw labs on you today:  BMET, TSH, Mg  Your physician has requested that you have an echocardiogram. Echocardiography is a painless test that uses sound waves to create images of your heart. It provides your doctor with information about the size and shape of your heart and how well your heart's chambers and valves are working. This procedure takes approximately one hour. There are no restrictions for this procedure.  Your physician has recommended that you wear an event monitor. Event monitors are medical devices that record the heart's electrical activity. Doctors most often [place] these monitors to diagnose arrhythmias. Arrhythmias are problems with the speed or rhythm of the heartbeat. The monitor is a small, portable device. You can wear one while you do your normal daily activities. This is usually used to diagnose what is causing palpitations/syncope (passing out).  Please follow up with Dr. Kirke Corin in 2 weeks.

## 2014-06-24 LAB — BASIC METABOLIC PANEL
BUN / CREAT RATIO: 22 (ref 10–22)
BUN: 23 mg/dL (ref 8–27)
CALCIUM: 9.7 mg/dL (ref 8.6–10.2)
CO2: 25 mmol/L (ref 18–29)
CREATININE: 1.04 mg/dL (ref 0.76–1.27)
Chloride: 102 mmol/L (ref 97–108)
GFR calc Af Amer: 81 mL/min/{1.73_m2} (ref >59)
GFR calc non Af Amer: 70 mL/min/{1.73_m2} (ref >59)
Glucose: 98 mg/dL (ref 65–99)
Potassium: 5.1 mmol/L (ref 3.5–5.2)
Sodium: 140 mmol/L (ref 134–144)

## 2014-06-24 LAB — TSH: TSH: 1.82 u[IU]/mL (ref 0.450–4.500)

## 2014-06-24 LAB — MAGNESIUM: Magnesium: 2.1 mg/dL (ref 1.6–2.6)

## 2014-06-25 ENCOUNTER — Other Ambulatory Visit: Payer: Self-pay

## 2014-06-25 ENCOUNTER — Other Ambulatory Visit (INDEPENDENT_AMBULATORY_CARE_PROVIDER_SITE_OTHER): Payer: Medicare Other

## 2014-06-25 DIAGNOSIS — R000 Tachycardia, unspecified: Secondary | ICD-10-CM

## 2014-06-25 DIAGNOSIS — I428 Other cardiomyopathies: Secondary | ICD-10-CM

## 2014-06-25 DIAGNOSIS — R002 Palpitations: Secondary | ICD-10-CM

## 2014-06-25 DIAGNOSIS — I471 Supraventricular tachycardia: Secondary | ICD-10-CM

## 2014-06-30 ENCOUNTER — Telehealth: Payer: Self-pay | Admitting: *Deleted

## 2014-06-30 NOTE — Telephone Encounter (Signed)
[facility] called and stated that patient had serious event  They faxed report  It was reviewed by Ward Givens NP  Thayer Ohm stated that holter showed normal sinus rhythm with pvc's and artifact

## 2014-07-10 ENCOUNTER — Encounter: Payer: Self-pay | Admitting: Cardiovascular Disease

## 2014-07-10 ENCOUNTER — Ambulatory Visit (INDEPENDENT_AMBULATORY_CARE_PROVIDER_SITE_OTHER): Payer: Medicare Other | Admitting: Cardiovascular Disease

## 2014-07-10 VITALS — BP 109/70 | HR 71 | Ht 71.0 in | Wt 215.5 lb

## 2014-07-10 DIAGNOSIS — I4949 Other premature depolarization: Secondary | ICD-10-CM

## 2014-07-10 DIAGNOSIS — I471 Supraventricular tachycardia: Secondary | ICD-10-CM

## 2014-07-10 DIAGNOSIS — I429 Cardiomyopathy, unspecified: Secondary | ICD-10-CM

## 2014-07-10 DIAGNOSIS — I493 Ventricular premature depolarization: Secondary | ICD-10-CM

## 2014-07-10 NOTE — Patient Instructions (Signed)
The smart phone monitor is called [employer]. You can get it on [employer] for 75 $.   Your physician wants you to follow-up in: 6 months.  You will receive a reminder letter in the mail two months in advance. If you don't receive a letter, please call our office to schedule the follow-up appointment.

## 2014-07-10 NOTE — Assessment & Plan Note (Signed)
Recent episodes of tachycardia are suggestive of supraventricular tachycardia. Telemetry so far shows no evidence of arrhythmia. Continue observation. He has been wearing an event monitor. I suggested a smart phone heart rate monitor if we are unable to detect arrhythmia with outpatient telemetry.

## 2014-07-10 NOTE — Progress Notes (Signed)
HPI  Vincent [name] is a 73 y.o. male seen in followup for supraventricular tachycardia and ventricular bigeminy. He also has a history of cardiomyopathy with a previous ejection fraction of 35-40%. Most recently it was 50-55% on echocardiogram in May of 2013. Catheterization from 2009 had demonstrated an ejection fraction of 40-45% range with a mild nonobstructive 20% plaque in his LAD.  He was noted to have frequent premature beats on physical in 2013. He underwent evaluation with a Holter monitor which showed frequent PVCs some in the form of bigeminy. 21% of beats were  PVCs. His echocardiogram showed low normal LV systolic function with an ejection fraction of 50-55% with mild mitral regurgitation.  He was switched from bisoprolol to Toprol and added magnesium supplement twice daily. Since then, he had significant improvement in his symptoms.A treadmill nuclear stress test which showed no evidence of ischemia with normal ejection fraction.  He was seen recently by Ward Givens for episodes of tachycardia while he was playing golf. He had labs done which overall was unremarkable. Echocardiogram showed an ejection fraction of 50-55%. He is wearing an outpatient telemetry and so far there has been no evidence of arrhythmia.  No Known Allergies   Current Outpatient Prescriptions on File Prior to Visit  Medication Sig Dispense Refill  . aspirin 81 MG EC tablet Take 81 mg by mouth daily.        [name] dutasteride (AVODART) 0.5 MG capsule Take 0.5 mg by mouth every other day.       . Glucosamine-Chondroitin 1500-1200 MG/30ML LIQD Take by mouth daily.        . Magnesium Oxide 400 (240 MG) MG TABS Take 1 tablet by mouth 2 (two) times daily.  60 tablet  6  . meloxicam (MOBIC) 15 MG tablet Take 15 mg by mouth as needed.       . metoprolol succinate (TOPROL XL) 25 MG 24 hr tablet Take 1 tablet (25 mg total) by mouth daily.  90 tablet  3  . Multiple Vitamin (MULTIVITAMIN) tablet Take 1 tablet by  mouth daily.        . naproxen sodium (ALEVE) 220 MG tablet Take 220 mg by mouth as needed.        [name] omeprazole (PRILOSEC) 20 MG capsule Take 20 mg by mouth daily.         No current facility-administered medications on file prior to visit.     Past Medical History  Diagnosis Date  . History of systolic CHF     a. Prev EF as low as 35-40%;  b. 03/2012 Echo: EF 50-55%, Gr 1 DD, mild MR.  . Dizziness and giddiness   . History of NICM     a. Prev EF as low as 35-40%;  b. 03/2012 Echo: EF 50-55%, Gr 1 DD, mild MR.  . Paroxysmal supraventricular tachycardia     a. SVT/PAT  . Non-obstructive CAD     a. 2009 nonobs dzs, LAD 20, EF 40-45%;  b. 07/2012 Ex MV: EF 55%, freq pvc's, no ischemia/infarct.  [name] PVC's (premature ventricular contractions)     a. Asymptomatic - managed with Toprol and Mg.     Past Surgical History  Procedure Laterality Date  . Hand surgery  1999  . Total knee arthroplasty       Family History  Problem Relation Age of Onset  . Coronary artery disease      family hx  . Heart failure  family hx - CHF  . Heart attack Father [id number]     History   Social History  . Marital Status: Married    Spouse Name: N/A    Number of Children: N/A  . Years of Education: N/A   Occupational History  . Not on file.   Social History Main Topics  . Smoking status: Never Smoker   . Smokeless tobacco: Not on file     Comment: tobacco use - no  . Alcohol Use: No  . Drug Use: No  . Sexual Activity: Not on file   Other Topics Concern  . Not on file   Social History Narrative   Retired, married, gets regular exercise.     PHYSICAL EXAM   BP 109/70  Pulse 71  Ht 5\' 11"  (1.803 m)  Wt 215 lb 8 oz (97.75 kg)  BMI 30.07 kg/m2 Constitutional: He is oriented to person, place, and time. He appears well-developed and well-nourished. No distress.  HENT: No nasal discharge.  Head: Normocephalic and atraumatic.  Eyes: Pupils are equal and round. Right eye exhibits no  discharge. Left eye exhibits no discharge.  Neck: Normal range of motion. Neck supple. No JVD present. No thyromegaly present.  Cardiovascular: Normal rate, regular rhythm, normal heart sounds and. Exam reveals no gallop and no friction rub. No murmur heard.  Pulmonary/Chest: Effort normal and breath sounds normal. No stridor. No respiratory distress. He has no wheezes. He has no rales. He exhibits no tenderness.  Abdominal: Soft. Bowel sounds are normal. He exhibits no distension. There is no tenderness. There is no rebound and no guarding.  Musculoskeletal: Normal range of motion. He exhibits no edema and no tenderness.  Neurological: He is alert and oriented to person, place, and time. Coordination normal.  Skin: Skin is warm and dry. No rash noted. He is not diaphoretic. No erythema. No pallor.  Psychiatric: He has a normal mood and affect. His behavior is normal. Judgment and thought content normal.       EKG: Sinus  Rhythm  - occasional ectopic ventricular beat    Low voltage in limb leads.   ABNORMAL    ASSESSMENT AND PLAN

## 2014-07-10 NOTE — Assessment & Plan Note (Signed)
Most recent ejection fraction was 50-55%.

## 2014-07-24 ENCOUNTER — Telehealth: Payer: Self-pay

## 2014-07-24 NOTE — Telephone Encounter (Signed)
Printed serious event for Ward Givens, NP to review.

## 2014-07-24 NOTE — Telephone Encounter (Signed)
Calling regarding serious EKG changes.

## 2014-08-11 ENCOUNTER — Encounter: Payer: Self-pay | Admitting: Cardiovascular Disease

## 2014-08-27 ENCOUNTER — Telehealth: Payer: Self-pay | Admitting: *Deleted

## 2014-08-27 NOTE — Telephone Encounter (Signed)
Informed patient per Dr. Mariah Milling his event monitor showed: Normal sinus rhythm with PVC's (frequent at times)   Patient verbalized understanding

## 2014-08-27 NOTE — Telephone Encounter (Signed)
Patient called wanting heart monitor results.

## 2014-08-31 ENCOUNTER — Other Ambulatory Visit: Payer: Self-pay

## 2014-08-31 ENCOUNTER — Ambulatory Visit (INDEPENDENT_AMBULATORY_CARE_PROVIDER_SITE_OTHER): Payer: Medicare Other

## 2014-08-31 DIAGNOSIS — I493 Ventricular premature depolarization: Secondary | ICD-10-CM

## 2014-08-31 DIAGNOSIS — I471 Supraventricular tachycardia, unspecified: Secondary | ICD-10-CM

## 2014-10-27 ENCOUNTER — Telehealth: Payer: Self-pay | Admitting: *Deleted

## 2014-10-27 NOTE — Telephone Encounter (Signed)
Patient called and wants to know if cardiac clearance for hand surgery on 11/18/14 w/ Dr. [name]. Please call patient.

## 2014-10-27 NOTE — Telephone Encounter (Signed)
He is at low risk.

## 2014-10-28 NOTE — Telephone Encounter (Signed)
Informed patient that per Dr. Kirke Corin he is at a low risk for his hand surgery  Patient stated he will call tomorrow and have office fax clearance form or provide [place] with fax number

## 2014-10-30 ENCOUNTER — Ambulatory Visit: Payer: Self-pay | Admitting: Internal Medicine

## 2014-10-30 LAB — COMPREHENSIVE METABOLIC PANEL
Albumin: 2.7 g/dL — ABNORMAL LOW (ref 3.4–5.0)
Alkaline Phosphatase: 150 U/L — ABNORMAL HIGH
Anion Gap: 5 — ABNORMAL LOW (ref 7–16)
BUN: 26 mg/dL — AB (ref 7–18)
Bilirubin,Total: 1.4 mg/dL — ABNORMAL HIGH (ref 0.2–1.0)
CO2: 29 mmol/L (ref 21–32)
Calcium, Total: 8.3 mg/dL — ABNORMAL LOW (ref 8.5–10.1)
Chloride: 98 mmol/L (ref 98–107)
Creatinine: 1.44 mg/dL — ABNORMAL HIGH (ref 0.60–1.30)
EGFR (African American): >60
GFR CALC NON AF AMER: 51 — AB
Glucose: 117 mg/dL — ABNORMAL HIGH (ref 65–99)
Osmolality: 270 (ref 275–301)
Potassium: 4.4 mmol/L (ref 3.5–5.1)
SGOT(AST): 71 U/L — ABNORMAL HIGH (ref 15–37)
SGPT (ALT): 55 U/L
Sodium: 132 mmol/L — ABNORMAL LOW (ref 136–145)
Total Protein: 6.8 g/dL (ref 6.4–8.2)

## 2014-10-30 LAB — CBC WITH DIFFERENTIAL/PLATELET
BASOS ABS: 0 10*3/uL (ref 0.0–0.1)
BASOS PCT: 0.3 %
Eosinophil #: 0 10*3/uL (ref 0.0–0.7)
Eosinophil %: 0.3 %
HCT: 36.9 % — AB (ref 40.0–52.0)
HGB: 12.3 g/dL — ABNORMAL LOW (ref 13.0–18.0)
Lymphocyte #: 0.7 10*3/uL — ABNORMAL LOW (ref 1.0–3.6)
Lymphocyte %: 8 %
MCH: 30.2 pg (ref 26.0–34.0)
MCHC: 33.4 g/dL (ref 32.0–36.0)
MCV: 91 fL (ref 80–100)
MONOS PCT: 6.8 %
Monocyte #: 0.6 x10 3/mm (ref 0.2–1.0)
Neutrophil #: 7.1 10*3/uL — ABNORMAL HIGH (ref 1.4–6.5)
Neutrophil %: 84.6 %
PLATELETS: 109 10*3/uL — AB (ref 150–440)
RBC: 4.08 10*6/uL — ABNORMAL LOW (ref 4.40–5.90)
RDW: 13.2 % (ref 11.5–14.5)
WBC: 8.3 10*3/uL (ref 3.8–10.6)

## 2014-10-31 ENCOUNTER — Inpatient Hospital Stay: Payer: Self-pay | Admitting: Surgery

## 2014-11-01 LAB — BASIC METABOLIC PANEL
Anion Gap: 5 — ABNORMAL LOW (ref 7–16)
BUN: 24 mg/dL — ABNORMAL HIGH (ref 7–18)
CREATININE: 1.39 mg/dL — AB (ref 0.60–1.30)
Calcium, Total: 8.3 mg/dL — ABNORMAL LOW (ref 8.5–10.1)
Chloride: 104 mmol/L (ref 98–107)
Co2: 27 mmol/L (ref 21–32)
EGFR (African American): >60
GFR CALC NON AF AMER: 53 — AB
Glucose: 141 mg/dL — ABNORMAL HIGH (ref 65–99)
OSMOLALITY: 278 (ref 275–301)
POTASSIUM: 4.2 mmol/L (ref 3.5–5.1)
Sodium: 136 mmol/L (ref 136–145)

## 2014-11-01 LAB — CBC WITH DIFFERENTIAL/PLATELET
BASOS ABS: 0 10*3/uL (ref 0.0–0.1)
BASOS PCT: 0.1 %
Eosinophil #: 0 10*3/uL (ref 0.0–0.7)
Eosinophil %: 0.1 %
HCT: 35.2 % — ABNORMAL LOW (ref 40.0–52.0)
HGB: 11.8 g/dL — ABNORMAL LOW (ref 13.0–18.0)
Lymphocyte #: 0.7 10*3/uL — ABNORMAL LOW (ref 1.0–3.6)
Lymphocyte %: 7.7 %
MCH: 30.4 pg (ref 26.0–34.0)
MCHC: 33.7 g/dL (ref 32.0–36.0)
MCV: 90 fL (ref 80–100)
Monocyte #: 0.5 x10 3/mm (ref 0.2–1.0)
Monocyte %: 5.8 %
Neutrophil #: 8 10*3/uL — ABNORMAL HIGH (ref 1.4–6.5)
Neutrophil %: 86.3 %
PLATELETS: 120 10*3/uL — AB (ref 150–440)
RBC: 3.89 10*6/uL — ABNORMAL LOW (ref 4.40–5.90)
RDW: 13.4 % (ref 11.5–14.5)
WBC: 9.2 10*3/uL (ref 3.8–10.6)

## 2014-11-01 LAB — MAGNESIUM: Magnesium: 2.4 mg/dL

## 2014-11-01 LAB — PHOSPHORUS: Phosphorus: 2.1 mg/dL — ABNORMAL LOW (ref 2.5–4.9)

## 2014-11-02 LAB — CBC WITH DIFFERENTIAL/PLATELET
BASOS ABS: 0 10*3/uL (ref 0.0–0.1)
Basophil %: 0.5 %
Eosinophil #: 0.1 10*3/uL (ref 0.0–0.7)
Eosinophil %: 1.6 %
HCT: 33.9 % — AB (ref 40.0–52.0)
HGB: 11.1 g/dL — AB (ref 13.0–18.0)
Lymphocyte #: 1.1 10*3/uL (ref 1.0–3.6)
Lymphocyte %: 16.3 %
MCH: 30.1 pg (ref 26.0–34.0)
MCHC: 32.9 g/dL (ref 32.0–36.0)
MCV: 92 fL (ref 80–100)
Monocyte #: 0.7 x10 3/mm (ref 0.2–1.0)
Monocyte %: 10.4 %
NEUTROS ABS: 4.7 10*3/uL (ref 1.4–6.5)
Neutrophil %: 71.2 %
Platelet: 135 10*3/uL — ABNORMAL LOW (ref 150–440)
RBC: 3.7 10*6/uL — ABNORMAL LOW (ref 4.40–5.90)
RDW: 13.4 % (ref 11.5–14.5)
WBC: 6.6 10*3/uL (ref 3.8–10.6)

## 2014-11-02 LAB — COMPREHENSIVE METABOLIC PANEL
ALK PHOS: 182 U/L — AB
ANION GAP: 6 — AB (ref 7–16)
AST: 120 U/L — AB (ref 15–37)
Albumin: 2.3 g/dL — ABNORMAL LOW (ref 3.4–5.0)
BILIRUBIN TOTAL: 1 mg/dL (ref 0.2–1.0)
BUN: 23 mg/dL — ABNORMAL HIGH (ref 7–18)
CALCIUM: 7.9 mg/dL — AB (ref 8.5–10.1)
CO2: 28 mmol/L (ref 21–32)
CREATININE: 1.39 mg/dL — AB (ref 0.60–1.30)
Chloride: 105 mmol/L (ref 98–107)
EGFR (African American): >60
EGFR (Non-African Amer.): 53 — ABNORMAL LOW
Glucose: 97 mg/dL (ref 65–99)
Osmolality: 281 (ref 275–301)
POTASSIUM: 4 mmol/L (ref 3.5–5.1)
SGPT (ALT): 100 U/L — ABNORMAL HIGH
Sodium: 139 mmol/L (ref 136–145)
TOTAL PROTEIN: 5.7 g/dL — AB (ref 6.4–8.2)

## 2014-11-05 LAB — CULTURE, BLOOD (SINGLE)

## 2014-11-06 LAB — CULTURE, BLOOD (SINGLE)

## 2014-11-11 ENCOUNTER — Ambulatory Visit: Payer: Self-pay | Admitting: Specialist

## 2014-11-17 ENCOUNTER — Ambulatory Visit: Payer: Self-pay | Admitting: Surgery

## 2014-11-17 ENCOUNTER — Telehealth: Payer: Self-pay | Admitting: *Deleted

## 2014-11-17 LAB — CBC WITH DIFFERENTIAL/PLATELET
BASOS ABS: 0.1 10*3/uL (ref 0.0–0.1)
Basophil %: 1 %
Eosinophil #: 0.1 10*3/uL (ref 0.0–0.7)
Eosinophil %: 1.1 %
HCT: 39.7 % — ABNORMAL LOW (ref 40.0–52.0)
HGB: 13.2 g/dL (ref 13.0–18.0)
LYMPHS ABS: 2.1 10*3/uL (ref 1.0–3.6)
Lymphocyte %: 23.3 %
MCH: 29.6 pg (ref 26.0–34.0)
MCHC: 33.2 g/dL (ref 32.0–36.0)
MCV: 89 fL (ref 80–100)
MONOS PCT: 6.5 %
Monocyte #: 0.6 x10 3/mm (ref 0.2–1.0)
NEUTROS ABS: 6.1 10*3/uL (ref 1.4–6.5)
NEUTROS PCT: 68.1 %
PLATELETS: 392 10*3/uL (ref 150–440)
RBC: 4.44 10*6/uL (ref 4.40–5.90)
RDW: 13.2 % (ref 11.5–14.5)
WBC: 9 10*3/uL (ref 3.8–10.6)

## 2014-11-17 LAB — COMPREHENSIVE METABOLIC PANEL
Albumin: 3.3 g/dL — ABNORMAL LOW (ref 3.4–5.0)
Alkaline Phosphatase: 118 U/L — ABNORMAL HIGH
Anion Gap: 7 (ref 7–16)
BUN: 21 mg/dL — ABNORMAL HIGH (ref 7–18)
Bilirubin,Total: 0.4 mg/dL (ref 0.2–1.0)
CALCIUM: 9.2 mg/dL (ref 8.5–10.1)
Chloride: 106 mmol/L (ref 98–107)
Co2: 27 mmol/L (ref 21–32)
Creatinine: 1.24 mg/dL (ref 0.60–1.30)
EGFR (African American): >60
EGFR (Non-African Amer.): >60
Glucose: 100 mg/dL — ABNORMAL HIGH (ref 65–99)
Osmolality: 282 (ref 275–301)
Potassium: 4.2 mmol/L (ref 3.5–5.1)
SGOT(AST): 29 U/L (ref 15–37)
SGPT (ALT): 20 U/L
SODIUM: 140 mmol/L (ref 136–145)
Total Protein: 8.6 g/dL — ABNORMAL HIGH (ref 6.4–8.2)

## 2014-11-17 NOTE — Telephone Encounter (Signed)
Surgery clearance faxed to [facility] ortho

## 2014-11-18 ENCOUNTER — Ambulatory Visit: Payer: Self-pay | Admitting: Specialist

## 2014-11-23 ENCOUNTER — Inpatient Hospital Stay: Payer: Self-pay | Admitting: Surgery

## 2014-11-24 DIAGNOSIS — I4891 Unspecified atrial fibrillation: Secondary | ICD-10-CM

## 2014-11-24 DIAGNOSIS — I10 Essential (primary) hypertension: Secondary | ICD-10-CM

## 2014-11-24 DIAGNOSIS — I471 Supraventricular tachycardia: Secondary | ICD-10-CM

## 2014-11-24 LAB — BASIC METABOLIC PANEL
ANION GAP: 8 (ref 7–16)
Anion Gap: 9 (ref 7–16)
BUN: 11 mg/dL (ref 7–18)
BUN: 11 mg/dL (ref 7–18)
CHLORIDE: 96 mmol/L — AB (ref 98–107)
CO2: 28 mmol/L (ref 21–32)
CO2: 27 mmol/L (ref 21–32)
CREATININE: 1.21 mg/dL (ref 0.60–1.30)
CREATININE: 1.11 mg/dL (ref 0.60–1.30)
Calcium, Total: 8.2 mg/dL — ABNORMAL LOW (ref 8.5–10.1)
Calcium, Total: 8.3 mg/dL — ABNORMAL LOW (ref 8.5–10.1)
Chloride: 100 mmol/L (ref 98–107)
EGFR (African American): >60
EGFR (African American): >60
EGFR (Non-African Amer.): >60
GLUCOSE: 111 mg/dL — AB (ref 65–99)
Glucose: 113 mg/dL — ABNORMAL HIGH (ref 65–99)
Osmolality: 267 (ref 275–301)
Osmolality: 270 (ref 275–301)
Potassium: 4.1 mmol/L (ref 3.5–5.1)
Potassium: 4.3 mmol/L (ref 3.5–5.1)
SODIUM: 133 mmol/L — AB (ref 136–145)
SODIUM: 135 mmol/L — AB (ref 136–145)

## 2014-11-24 LAB — CBC WITH DIFFERENTIAL/PLATELET
BASOS ABS: 0 10*3/uL (ref 0.0–0.1)
BASOS ABS: 0.1 10*3/uL (ref 0.0–0.1)
Basophil #: 0 10*3/uL (ref 0.0–0.1)
Basophil %: 0.5 %
Basophil %: 0.7 %
Basophil %: 0.4 %
EOS ABS: 0.1 10*3/uL (ref 0.0–0.7)
EOS PCT: 0.9 %
EOS PCT: 0.4 %
EOS PCT: 0.7 %
Eosinophil #: 0 10*3/uL (ref 0.0–0.7)
Eosinophil #: 0.1 10*3/uL (ref 0.0–0.7)
HCT: 32.8 % — ABNORMAL LOW (ref 40.0–52.0)
HCT: 29.8 % — ABNORMAL LOW (ref 40.0–52.0)
HCT: 31.8 % — AB (ref 40.0–52.0)
HGB: 11 g/dL — AB (ref 13.0–18.0)
HGB: 10.2 g/dL — AB (ref 13.0–18.0)
HGB: 10.5 g/dL — ABNORMAL LOW (ref 13.0–18.0)
LYMPHS PCT: 10.4 %
LYMPHS PCT: 10.4 %
Lymphocyte #: 1.3 10*3/uL (ref 1.0–3.6)
Lymphocyte #: 1.1 10*3/uL (ref 1.0–3.6)
Lymphocyte #: 1 10*3/uL (ref 1.0–3.6)
Lymphocyte %: 11.8 %
MCH: 29.5 pg (ref 26.0–34.0)
MCH: 30 pg (ref 26.0–34.0)
MCH: 29.1 pg (ref 26.0–34.0)
MCHC: 33.6 g/dL (ref 32.0–36.0)
MCHC: 34.3 g/dL (ref 32.0–36.0)
MCHC: 32.9 g/dL (ref 32.0–36.0)
MCV: 88 fL (ref 80–100)
MCV: 88 fL (ref 80–100)
MCV: 89 fL (ref 80–100)
MONOS PCT: 5.2 %
Monocyte #: 1.2 x10 3/mm — ABNORMAL HIGH (ref 0.2–1.0)
Monocyte #: 0.9 x10 3/mm (ref 0.2–1.0)
Monocyte #: 0.5 x10 3/mm (ref 0.2–1.0)
Monocyte %: 10.8 %
Monocyte %: 9.1 %
NEUTROS ABS: 8 10*3/uL — AB (ref 1.4–6.5)
NEUTROS PCT: 76 %
Neutrophil #: 8.1 10*3/uL — ABNORMAL HIGH (ref 1.4–6.5)
Neutrophil #: 8.1 10*3/uL — ABNORMAL HIGH (ref 1.4–6.5)
Neutrophil %: 79.4 %
Neutrophil %: 83.3 %
PLATELETS: 265 10*3/uL (ref 150–440)
Platelet: 261 10*3/uL (ref 150–440)
Platelet: 276 10*3/uL (ref 150–440)
RBC: 3.73 10*6/uL — ABNORMAL LOW (ref 4.40–5.90)
RBC: 3.41 10*6/uL — ABNORMAL LOW (ref 4.40–5.90)
RBC: 3.59 10*6/uL — AB (ref 4.40–5.90)
RDW: 12.9 % (ref 11.5–14.5)
RDW: 13.1 % (ref 11.5–14.5)
RDW: 12.9 % (ref 11.5–14.5)
WBC: 10.7 10*3/uL — ABNORMAL HIGH (ref 3.8–10.6)
WBC: 10.2 10*3/uL (ref 3.8–10.6)
WBC: 9.6 10*3/uL (ref 3.8–10.6)

## 2014-11-24 LAB — APTT: Activated PTT: 45.3 secs — ABNORMAL HIGH (ref 23.6–35.9)

## 2014-11-24 LAB — PROTIME-INR
INR: 1.3
Prothrombin Time: 15.7 secs — ABNORMAL HIGH (ref 11.5–14.7)

## 2014-11-24 LAB — MAGNESIUM: Magnesium: 1.5 mg/dL — ABNORMAL LOW

## 2014-11-25 LAB — COMPREHENSIVE METABOLIC PANEL
ALBUMIN: 2.1 g/dL — AB (ref 3.4–5.0)
Alkaline Phosphatase: 76 U/L
Anion Gap: 6 — ABNORMAL LOW (ref 7–16)
BUN: 10 mg/dL (ref 7–18)
Bilirubin,Total: 0.5 mg/dL (ref 0.2–1.0)
Calcium, Total: 8.6 mg/dL (ref 8.5–10.1)
Chloride: 100 mmol/L (ref 98–107)
Co2: 31 mmol/L (ref 21–32)
Creatinine: 1.13 mg/dL (ref 0.60–1.30)
EGFR (African American): >60
EGFR (Non-African Amer.): >60
GLUCOSE: 114 mg/dL — AB (ref 65–99)
Osmolality: 274 (ref 275–301)
Potassium: 4.2 mmol/L (ref 3.5–5.1)
SGOT(AST): 21 U/L (ref 15–37)
SGPT (ALT): 21 U/L
Sodium: 137 mmol/L (ref 136–145)
TOTAL PROTEIN: 6.4 g/dL (ref 6.4–8.2)

## 2014-11-25 LAB — CBC WITH DIFFERENTIAL/PLATELET
Basophil #: 0 10*3/uL (ref 0.0–0.1)
Basophil %: 0.3 %
EOS ABS: 0 10*3/uL (ref 0.0–0.7)
Eosinophil %: 0.3 %
HCT: 30.9 % — AB (ref 40.0–52.0)
HGB: 10.4 g/dL — AB (ref 13.0–18.0)
LYMPHS ABS: 1.4 10*3/uL (ref 1.0–3.6)
Lymphocyte %: 15.8 %
MCH: 29.5 pg (ref 26.0–34.0)
MCHC: 33.5 g/dL (ref 32.0–36.0)
MCV: 88 fL (ref 80–100)
MONO ABS: 0.5 x10 3/mm (ref 0.2–1.0)
Monocyte %: 5.5 %
NEUTROS PCT: 78.1 %
Neutrophil #: 6.8 10*3/uL — ABNORMAL HIGH (ref 1.4–6.5)
Platelet: 251 10*3/uL (ref 150–440)
RBC: 3.51 10*6/uL — ABNORMAL LOW (ref 4.40–5.90)
RDW: 13.1 % (ref 11.5–14.5)
WBC: 8.7 10*3/uL (ref 3.8–10.6)

## 2014-11-25 LAB — MAGNESIUM: MAGNESIUM: 1.9 mg/dL

## 2014-11-26 DIAGNOSIS — I471 Supraventricular tachycardia: Secondary | ICD-10-CM

## 2014-11-26 LAB — BASIC METABOLIC PANEL
ANION GAP: 6 — AB (ref 7–16)
BUN: 12 mg/dL (ref 7–18)
CREATININE: 1.06 mg/dL (ref 0.60–1.30)
Calcium, Total: 8.8 mg/dL (ref 8.5–10.1)
Chloride: 99 mmol/L (ref 98–107)
Co2: 29 mmol/L (ref 21–32)
EGFR (Non-African Amer.): >60
GLUCOSE: 102 mg/dL — AB (ref 65–99)
OSMOLALITY: 268 (ref 275–301)
Potassium: 3.9 mmol/L (ref 3.5–5.1)
Sodium: 134 mmol/L — ABNORMAL LOW (ref 136–145)

## 2014-11-27 NOTE — Telephone Encounter (Signed)
This encounter was created in error - please disregard.

## 2014-11-29 LAB — CULTURE, BLOOD (SINGLE)

## 2014-12-01 LAB — MISC AER/ANAEROBIC CULT.

## 2014-12-10 ENCOUNTER — Encounter: Payer: Self-pay | Admitting: Cardiovascular Disease

## 2014-12-10 ENCOUNTER — Ambulatory Visit (INDEPENDENT_AMBULATORY_CARE_PROVIDER_SITE_OTHER): Payer: Medicare Other | Admitting: Cardiovascular Disease

## 2014-12-10 VITALS — BP 98/70 | HR 55 | Ht 71.0 in | Wt 213.0 lb

## 2014-12-10 DIAGNOSIS — I5022 Chronic systolic (congestive) heart failure: Secondary | ICD-10-CM

## 2014-12-10 DIAGNOSIS — I4891 Unspecified atrial fibrillation: Secondary | ICD-10-CM

## 2014-12-10 DIAGNOSIS — I9789 Other postprocedural complications and disorders of the circulatory system, not elsewhere classified: Secondary | ICD-10-CM

## 2014-12-10 DIAGNOSIS — I4819 Other persistent atrial fibrillation: Secondary | ICD-10-CM | POA: Insufficient documentation

## 2014-12-10 DIAGNOSIS — I493 Ventricular premature depolarization: Secondary | ICD-10-CM

## 2014-12-10 DIAGNOSIS — I499 Cardiac arrhythmia, unspecified: Secondary | ICD-10-CM

## 2014-12-10 MED ORDER — AMIODARONE HCL 200 MG PO TABS: 200.0000 mg | ORAL_TABLET | Freq: Every day | ORAL | Status: DC

## 2014-12-10 NOTE — Assessment & Plan Note (Signed)
He has no PVCs noted on metoprolol and amiodarone.

## 2014-12-10 NOTE — Assessment & Plan Note (Addendum)
This was the first documented episode of atrial fibrillation and was postoperative. If he develops any recurrent episodes, I recommend long-term anticoagulation. For now, continue aspirin daily. He is on amiodarone and maintaining in sinus rhythm. I will check liver and thyroid panel in few months. I might consider stopping amiodarone altogether and monitoring for arrhythmia. I decreased amiodarone 200 mg once daily.

## 2014-12-10 NOTE — Progress Notes (Signed)
HPI  Vincent [name] is a 74 y.o. male seen in followup for supraventricular tachycardia and ventricular bigeminy. He also has a history of cardiomyopathy with a previous ejection fraction of 35-40%. Most recently it was 50-55% on echocardiogram in May of 2013. Catheterization from 2009 had demonstrated an ejection fraction of 40-45% range with a mild nonobstructive 20% plaque in his LAD.  He was noted to have frequent premature beats on physical in 2013. He underwent evaluation with a Holter monitor which showed frequent PVCs some in the form of bigeminy. 21% of beats were  PVCs. His echocardiogram showed low normal LV systolic function with an ejection fraction of 50-55% with mild mitral regurgitation.  He was switched from bisoprolol to Toprol and added magnesium supplement twice daily. Since then, he had significant improvement in his symptoms.A treadmill nuclear stress test  showed no evidence of ischemia with normal ejection fraction.  He had recent hand surgery in December. He presented soon after that with a ruptured appendix. He underwent emergent appendectomy but did develop an abscess which required drainage. During this period, he developed supraventricular tachycardia which converted to sinus rhythm but then he went into atrial fibrillation with rapid ventricular response. He converted to normal sinus rhythm with amiodarone. He has been doing very well since then and denies any chest pain, shortness of breath or palpitations.  No Known Allergies   Current Outpatient Prescriptions on File Prior to Visit  Medication Sig Dispense Refill  . aspirin 81 MG EC tablet Take 81 mg by mouth daily.      [name] dutasteride (AVODART) 0.5 MG capsule Take 0.5 mg by mouth daily.     . Glucosamine-Chondroitin 1500-1200 MG/30ML LIQD Take by mouth daily.      . Magnesium Oxide 400 (240 MG) MG TABS Take 1 tablet by mouth 2 (two) times daily. 60 tablet 6  . meloxicam (MOBIC) 15 MG tablet Take 15 mg by  mouth 2 (two) times daily.     . metoprolol succinate (TOPROL XL) 25 MG 24 hr tablet Take 1 tablet (25 mg total) by mouth daily. 90 tablet 3  . Multiple Vitamin (MULTIVITAMIN) tablet Take 1 tablet by mouth daily.      . naproxen sodium (ALEVE) 220 MG tablet Take 220 mg by mouth as needed.      [name] omeprazole (PRILOSEC) 20 MG capsule Take 20 mg by mouth daily.       No current facility-administered medications on file prior to visit.     Past Medical History  Diagnosis Date  . History of systolic CHF     a. Prev EF as low as 35-40%;  b. 03/2012 Echo: EF 50-55%, Gr 1 DD, mild MR.  . Dizziness and giddiness   . History of NICM     a. Prev EF as low as 35-40%;  b. 03/2012 Echo: EF 50-55%, Gr 1 DD, mild MR.  . Paroxysmal supraventricular tachycardia     a. SVT/PAT  . Non-obstructive CAD     a. 2009 nonobs dzs, LAD 20, EF 40-45%;  b. 07/2012 Ex MV: EF 55%, freq pvc's, no ischemia/infarct.  [name] PVC's (premature ventricular contractions)     a. Asymptomatic - managed with Toprol and Mg.     Past Surgical History  Procedure Laterality Date  . Hand surgery  1999  . Total knee arthroplasty    . Appendectomy    . Abscess drainage       Family History  Problem Relation  Age of Onset  . Coronary artery disease      family hx  . Heart failure      family hx - CHF  . Heart attack Father 17     History   Social History  . Marital Status: Married    Spouse Name: N/A    Number of Children: N/A  . Years of Education: N/A   Occupational History  . Not on file.   Social History Main Topics  . Smoking status: Never Smoker   . Smokeless tobacco: Not on file     Comment: tobacco use - no  . Alcohol Use: No  . Drug Use: No  . Sexual Activity: Not on file   Other Topics Concern  . Not on file   Social History Narrative   Retired, married, gets regular exercise.     PHYSICAL EXAM   BP 98/70 mmHg  Pulse 55  Ht 5\' 11"  (1.803 m)  Wt 213 lb (96.616 kg)  BMI 29.72  kg/m2 Constitutional: He is oriented to person, place, and time. He appears well-developed and well-nourished. No distress.  HENT: No nasal discharge.  Head: Normocephalic and atraumatic.  Eyes: Pupils are equal and round. Right eye exhibits no discharge. Left eye exhibits no discharge.  Neck: Normal range of motion. Neck supple. No JVD present. No thyromegaly present.  Cardiovascular: Normal rate, regular rhythm, normal heart sounds and. Exam reveals no gallop and no friction rub. No murmur heard.  Pulmonary/Chest: Effort normal and breath sounds normal. No stridor. No respiratory distress. He has no wheezes. He has no rales. He exhibits no tenderness.  Abdominal: Soft. Bowel sounds are normal. He exhibits no distension. There is no tenderness. There is no rebound and no guarding.  Musculoskeletal: Normal range of motion. He exhibits no edema and no tenderness.  Neurological: He is alert and oriented to person, place, and time. Coordination normal.  Skin: Skin is warm and dry. No rash noted. He is not diaphoretic. No erythema. No pallor.  Psychiatric: He has a normal mood and affect. His behavior is normal. Judgment and thought content normal.       EKG: Sinus  Bradycardia  WITHIN NORMAL LIMITS   ASSESSMENT AND PLAN

## 2014-12-10 NOTE — Assessment & Plan Note (Addendum)
Due to nonischemic cardiomyopathy with most recent ejection fraction of 50-55%. He appears to be euvolemic. He is not on ACE inhibitor due to hypotension.

## 2014-12-10 NOTE — Patient Instructions (Signed)
Decrease Amiodarone to 200 mg once daily.   Follow up in 3 months.

## 2015-01-07 ENCOUNTER — Ambulatory Visit: Payer: Medicare Other | Admitting: Cardiovascular Disease

## 2015-02-19 ENCOUNTER — Other Ambulatory Visit: Payer: Self-pay | Admitting: *Deleted

## 2015-02-19 ENCOUNTER — Telehealth: Payer: Self-pay | Admitting: *Deleted

## 2015-02-19 MED ORDER — AMIODARONE HCL 200 MG PO TABS: 200.0000 mg | ORAL_TABLET | Freq: Every day | ORAL | Status: DC

## 2015-02-19 NOTE — Telephone Encounter (Signed)
Yes. That's fine.  

## 2015-02-19 NOTE — Telephone Encounter (Signed)
Amiodarone 200 mg 1 tablet qd.  Dispense#30 R#3 [facility] [address], [place]

## 2015-02-19 NOTE — Telephone Encounter (Signed)
Ok to refill before next office visit?

## 2015-02-19 NOTE — Telephone Encounter (Signed)
Pt is calling asking about his medication, Amiodarone. He will run out right before his follow up. He wants to know is this okay. Or do we need to send in a refill.  Please advise.

## 2015-02-25 ENCOUNTER — Encounter: Payer: Self-pay | Admitting: Cardiovascular Disease

## 2015-02-25 ENCOUNTER — Ambulatory Visit (INDEPENDENT_AMBULATORY_CARE_PROVIDER_SITE_OTHER): Payer: Medicare Other | Admitting: Cardiovascular Disease

## 2015-02-25 VITALS — BP 110/78 | HR 53 | Ht 71.0 in | Wt 220.0 lb

## 2015-02-25 DIAGNOSIS — I493 Ventricular premature depolarization: Secondary | ICD-10-CM

## 2015-02-25 DIAGNOSIS — I4891 Unspecified atrial fibrillation: Secondary | ICD-10-CM

## 2015-02-25 DIAGNOSIS — I9789 Other postprocedural complications and disorders of the circulatory system, not elsewhere classified: Secondary | ICD-10-CM

## 2015-02-25 DIAGNOSIS — I5022 Chronic systolic (congestive) heart failure: Secondary | ICD-10-CM

## 2015-02-25 NOTE — Progress Notes (Signed)
HPI  Vincent [name] is a 74 y.o. male seen in followup for supraventricular tachycardia, postoperative atrial fibrillation and ventricular bigeminy. He also has a history of cardiomyopathy with a previous ejection fraction of 35-40%. Most recently it was 50-55% on echocardiogram in May of 2013. Catheterization from 2009 had demonstrated an ejection fraction of 40-45% range with a mild nonobstructive 20% plaque in his LAD.  He was noted to have frequent premature beats on physical in 2013. He underwent evaluation with a Holter monitor which showed frequent PVCs some in the form of bigeminy. 21% of beats were  PVCs. His echocardiogram showed low normal LV systolic function with an ejection fraction of 50-55% with mild mitral regurgitation.  He was switched from bisoprolol to Toprol and added magnesium supplement twice daily. Since then, he had significant improvement in his symptoms.A treadmill nuclear stress test  showed no evidence of ischemia with normal ejection fraction.  He had ruptured appendix in December 2015. He underwent emergent appendectomy but did develop an abscess which required drainage. During this period, he developed supraventricular tachycardia which converted to sinus rhythm but then he went into atrial fibrillation with rapid ventricular response. He converted to normal sinus rhythm with amiodarone. He has been doing very well since then and denies any chest pain, shortness of breath or palpitations. He was found to have an abnormal thyroid function recently likely due to amiodarone.  No Known Allergies   Current Outpatient Prescriptions on File Prior to Visit  Medication Sig Dispense Refill  . amiodarone (PACERONE) 200 MG tablet Take 1 tablet (200 mg total) by mouth daily. 30 tablet 3  . amoxicillin-clavulanate (AUGMENTIN) 875-125 MG per tablet Take 1 tablet by mouth every 12 (twelve) hours.    [name] aspirin 81 MG EC tablet Take 81 mg by mouth daily.      [name] dutasteride  (AVODART) 0.5 MG capsule Take 0.5 mg by mouth daily.     . Glucosamine-Chondroitin 1500-1200 MG/30ML LIQD Take by mouth daily.      [name] HYDROcodone-acetaminophen (NORCO/VICODIN) 5-325 MG per tablet Take 1 tablet by mouth every 6 (six) hours as needed for moderate pain.    . Magnesium Oxide 400 (240 MG) MG TABS Take 1 tablet by mouth 2 (two) times daily. 60 tablet 6  . meloxicam (MOBIC) 15 MG tablet Take 15 mg by mouth daily.     . metoprolol succinate (TOPROL XL) 25 MG 24 hr tablet Take 1 tablet (25 mg total) by mouth daily. 90 tablet 3  . Multiple Vitamin (MULTIVITAMIN) tablet Take 1 tablet by mouth daily.      . naproxen sodium (ALEVE) 220 MG tablet Take 220 mg by mouth as needed.      [name] omeprazole (PRILOSEC) 20 MG capsule Take 20 mg by mouth daily.       No current facility-administered medications on file prior to visit.     Past Medical History  Diagnosis Date  . History of systolic CHF     a. Prev EF as low as 35-40%;  b. 03/2012 Echo: EF 50-55%, Gr 1 DD, mild MR.  . Dizziness and giddiness   . History of NICM     a. Prev EF as low as 35-40%;  b. 03/2012 Echo: EF 50-55%, Gr 1 DD, mild MR.  . Paroxysmal supraventricular tachycardia     a. SVT/PAT  . Non-obstructive CAD     a. 2009 nonobs dzs, LAD 20, EF 40-45%;  b. 07/2012 Ex MV: EF  55%, freq pvc's, no ischemia/infarct.  [name] PVC's (premature ventricular contractions)     a. Asymptomatic - managed with Toprol and Mg.     Past Surgical History  Procedure Laterality Date  . Hand surgery  1999  . Total knee arthroplasty    . Appendectomy    . Abscess drainage       Family History  Problem Relation Age of Onset  . Coronary artery disease      family hx  . Heart failure      family hx - CHF  . Heart attack Father 96     History   Social History  . Marital Status: Married    Spouse Name: N/A  . Number of Children: N/A  . Years of Education: N/A   Occupational History  . Not on file.   Social History Main Topics  .  Smoking status: Never Smoker   . Smokeless tobacco: Not on file     Comment: tobacco use - no  . Alcohol Use: No  . Drug Use: No  . Sexual Activity: Not on file   Other Topics Concern  . Not on file   Social History Narrative   Retired, married, gets regular exercise.     PHYSICAL EXAM   BP 110/78 mmHg  Pulse 53  Ht  (1.803 m)  Wt 220 lb (99.791 kg)  BMI 30.70 kg/m2 Constitutional: He is oriented to person, place, and time. He appears well-developed and well-nourished. No distress.  HENT: No nasal discharge.  Head: Normocephalic and atraumatic.  Eyes: Pupils are equal and round. Right eye exhibits no discharge. Left eye exhibits no discharge.  Neck: Normal range of motion. Neck supple. No JVD present. No thyromegaly present.  Cardiovascular: Normal rate, regular rhythm, normal heart sounds and. Exam reveals no gallop and no friction rub. No murmur heard.  Pulmonary/Chest: Effort normal and breath sounds normal. No stridor. No respiratory distress. He has no wheezes. He has no rales. He exhibits no tenderness.  Abdominal: Soft. Bowel sounds are normal. He exhibits no distension. There is no tenderness. There is no rebound and no guarding.  Musculoskeletal: Normal range of motion. He exhibits no edema and no tenderness.  Neurological: He is alert and oriented to person, place, and time. Coordination normal.  Skin: Skin is warm and dry. No rash noted. He is not diaphoretic. No erythema. No pallor.  Psychiatric: He has a normal mood and affect. His behavior is normal. Judgment and thought content normal.         ASSESSMENT AND PLAN

## 2015-02-25 NOTE — Patient Instructions (Signed)
Stop taking Amiodarone.   Continue other medications.   Your physician wants you to follow-up in: 6 months.  You will receive a reminder letter in the mail two months in advance. If you don't receive a letter, please call our office to schedule the follow-up appointment.

## 2015-02-25 NOTE — Assessment & Plan Note (Signed)
Continue treatment with small dose Toprol.

## 2015-02-25 NOTE — Assessment & Plan Note (Signed)
The patient had abnormal thyroid function likely due to treatment with amiodarone. Given that he had only one documented episode of A. fib postoperatively, I think the best option is to discontinue treatment with amiodarone and monitor him for recurrent arrhythmia.

## 2015-02-25 NOTE — Assessment & Plan Note (Signed)
Due to nonischemic cardiomyopathy with most recent ejection fraction of 50-55%. He appears to be euvolemic. He is not on ACE inhibitor due to hypotension. 

## 2015-03-05 ENCOUNTER — Other Ambulatory Visit: Payer: Self-pay | Admitting: Cardiovascular Disease

## 2015-03-13 NOTE — Op Note (Signed)
PATIENT NAME:  Vincent [name], Vincent [name] MR#:  [id number] DATE OF BIRTH:  1941/03/13  DATE OF PROCEDURE:  10/31/2014  PREOPERATIVE DIAGNOSIS: Acute appendicitis.   POSTOPERATIVE DIAGNOSIS: Acute gangrenous appendicitis.   PROCEDURE: Laparoscopic appendectomy.   SURGEON: Richard E. Excell Seltzer, M.D.   ANESTHESIA: General with endotracheal tube.   INDICATIONS: This is a patient with progressive right lower quadrant pain and tenderness with signs of peritoneal irritation and a CT scan suggestive of appendicitis. Preoperatively, we discussed the rationale for surgery, the options of observation, the risks of bleeding, infection, recurrence, negative laparoscopy, and conversion to an open procedure. This was reviewed for him and his wife. They understood and agreed to proceed.   FINDINGS: Acute gangrenous appendicitis, nonruptured.   DESCRIPTION OF PROCEDURE: The patient was induced to general anesthesia. A Foley catheter was placed. He was given IV antibiotics. VTE prophylaxis was in place. He was prepped and draped in a sterile fashion. Marcaine was infiltrated in skin and subcutaneous tissues around the periumbilical area. Incision was made. Veress needle was placed. Pneumoperitoneum was obtained. A 5 mm trocar port was placed. The abdominal cavity was explored, and under direct vision, a 5 mm suprapubic port and left lateral 12 mm port was placed. The appendix was identified in the right lower quadrant. It was found to be gangrenous, but not ruptured. The base of the appendix was divided with a standard load Endo GIA, and then multiple firings of a vascular load Endo GIA was fired across the mesoappendix to allow elevation and removal of the appendix. It was passed out through the lateral port site with the aid of an Endo Catch bag. The area was checked for hemostasis. Clips were placed on some mild bleeding sites along the staple line and irrigation was performed to ensure that bleeding was not present, and once  this was assured, the left lateral port site was closed under direct vision with an Endo Close technique utilizing 0 Vicryl simple sutures. Again, hemostasis was checked and found to be adequate. The pneumoperitoneum was released. All ports were removed, 4-0 subcuticular Monocryl was used on all skin edges. Steri-Strips, Mastisol, and sterile dressings were placed. The patient tolerated the procedure well. There were no complications. He was taken to the recovery room in stable condition to be admitted for continued care.    ____________________________ Adah Salvage. Excell Seltzer, MD rec:mw D: 10/31/2014 01:12:21 ET T: 10/31/2014 04:23:47 ET JOB#: [id number]  cc: Adah Salvage. Excell Seltzer, MD, <Dictator> Lattie Haw MD ELECTRONICALLY SIGNED 10/31/2014 19:50

## 2015-03-13 NOTE — H&P (Signed)
PATIENT NAME:  Vincent [name], DEO MR#:  [id number] DATE OF BIRTH:  11-16-1941  DATE OF ADMISSION:  10/30/2014  CHIEF COMPLAINT: Abdominal pain.   HISTORY OF PRESENT ILLNESS: This is a 74 year old male patient who admits to approximately 3 to 4 days of abdominal pain that started on Tuesday afternoon. He was perfectly normal before that, but he has had some shaking chills most of the week. His wife states that he thought that he had a virus. He has had nausea, which is minimal. No emesis. He had a normal bowel movement. No diarrhea. No melena. No hematochezia. No prior episode. Again, he has had these shaking chills all week. He points to his periumbilical area and to the right side as the area of maximum pain. A workup in the emergency room suggests acute appendicitis.   PAST MEDICAL HISTORY: Bradycardia. Denies myocardial infarction or stroke. He also has BPH.   PAST SURGICAL HISTORY: Hand surgery. Denies any abdominal surgery.   ALLERGIES: None.   MEDICATIONS: Multiple. See reconciliation.   FAMILY HISTORY: Noncontributory.   SOCIAL HISTORY: The patient does not smoke or drink. He is retired from [employer].   REVIEW OF SYSTEMS: A 10 system review was performed and negative with the exception of that mentioned in the HPI.   PHYSICAL EXAMINATION: VITAL SIGNS: Show a temperature of 100, pulse of 81, respirations 22, blood pressure 124/86, pain scale of 3 and 99% room air saturation.   GENERAL: He appears eyes closed, somewhat anxious and shaking vigorously.   HEENT: Shows ho scleral icterus.   NECK: No palpable neck nodes.   CHEST: Clear to auscultation.   CARDIAC: Regular rate and rhythm, not bradycardic.   ABDOMEN: Distended, slightly tympanitic, but soft on the left side. There is guarding and rebound in the right side at McBurney's point with a positive Rovsing's sign.   EXTREMITIES: Without edema.   NEUROLOGIC: Grossly intact.   INTEGUMENT: No jaundice.   LABORATORY  DATA: Show a normal white blood cell count of 8.3 and hemoglobin and hematocrit of 12 and 37 with a platelet count of 109,000. Albumin is 2.7, sodium 132, creatinine of 1.44 and a BUN of 26.   DIAGNOSTIC DATA: CT scan demonstrates probable appendicitis with some pneumatosis and dilatation and stranding.   ASSESSMENT AND PLAN: This is a patient with 3 days of abdominal pain with some shaking chills. He has been started on IV antibiotics and given fluids. My recommendations are for laparoscopic appendectomy based on his physical exam findings, with this history and the CT findings as well. The rationale for this has been discussed with he and his wife. The options of observation have been reviewed. The risks of bleeding, infection, negative laparoscopy, conversion to an open procedure and bowel injury were all were viewed with them. They understood and agreed to proceed.     ____________________________ Adah Salvage Excell Seltzer, MD rec:TT D: 10/30/2014 20:52:02 ET T: 10/30/2014 21:29:04 ET JOB#: [id number]  cc: Adah Salvage. Excell Seltzer, MD, <Dictator> Lattie Haw MD ELECTRONICALLY SIGNED 10/31/2014 1:49

## 2015-03-13 NOTE — H&P (Signed)
   Subjective/Chief Complaint abd pain   History of Present Illness trhee days mid abd pain, nausea, no emesis chills all week nml BM, no emesis no prior episode anorexia   Past History PMH bradycardia, BPH nephrolith PSH hand surgery   Past Med/Surgical Hx:  denies med hx:   left hand surg:   ALLERGIES:  No Known Allergies:    Medications not anticoagulated   Family and Social History:  Family History Non-Contributory   Social History negative tobacco, negative ETOH, retired Physiological scientist of [employer]   Review of Systems:  Fever/Chills Yes   Cough No   Sputum No   Abdominal Pain Yes   Diarrhea No   Constipation No   Nausea/Vomiting No   SOB/DOE No   Chest Pain No   Dysuria No   Tolerating Diet No  anorexic   Medications/Allergies Reviewed Medications/Allergies reviewed   Physical Exam:  GEN shaking chill   HEENT pink conjunctivae   RESP normal resp effort  clear BS  no use of accessory muscles   CARD regular rate   ABD positive tenderness  guarding, rebound pos rovsing's signfocal tenderness at McB pt   LYMPH negative neck   EXTR negative edema   SKIN normal to palpation   PSYCH alert, A+O to time, place, person, anxious    Assessment/Admission Diagnosis acute appendicitis rec lap appy options/risks in detail agrees with plan   Electronic Signatures: Lattie Haw (MD)  (Signed 11-Dec-15 20:47)  Authored: CHIEF COMPLAINT and HISTORY, PAST MEDICAL/SURGIAL HISTORY, ALLERGIES, OTHER MEDICATIONS, FAMILY AND SOCIAL HISTORY, REVIEW OF SYSTEMS, PHYSICAL EXAM, ASSESSMENT AND PLAN   Last Updated: 11-Dec-15 20:47 by Lattie Haw (MD)

## 2015-03-15 LAB — SURGICAL PATHOLOGY

## 2015-03-16 ENCOUNTER — Ambulatory Visit: Payer: Medicare Other | Admitting: Cardiovascular Disease

## 2015-03-17 NOTE — Op Note (Signed)
PATIENT NAME:  Vincent [name], Vincent [name] MR#:  [id number] DATE OF BIRTH:  September 07, 1941  DATE OF PROCEDURE:  11/18/2014  PREOPERATIVE DIAGNOSES:  1.  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  2.  Keratotic lesion, right ring finger distal phalanx ulnarly.   PROCEDURES:  1.  Excision of extensive Dupuytren disease, right thumb, small finger and palm.  2.  Excision of lesion of the right ring finger.   SURGEON: Valinda Hoar, MD   ANESTHESIA: General LMA.   COMPLICATIONS: General endotracheal.   COMPLICATIONS: None.   DRAINS: None.   ESTIMATED BLOOD LOSS: Minimal.   REPLACED: None.   DESCRIPTION OF PROCEDURE: The patient was brought to the Operating Room where he underwent satisfactory general endotracheal anesthesia in the supine position. The right arm was prepped and draped in sterile fashion. An Esmarch was applied. The tourniquet was inflated to 250 mmHg. Tourniquet time was 108 minutes.   The ring finger lesion was approached first. This was excised in an elliptical incision, and then this wound was closed with 4-0 nylon suture. Next, incisions were mapped out over the thumb, going out to the IP joint and continuing down into the palm. A zigzag incision was made and dissection carried out carefully under loupe magnification. Very thick Dupuytren disease was densely adherent to the skin throughout most of the incision. This was carefully peeled off using a mini blade knife. The small scissors were used to dissect around the Dupuytren disease. I was able to get proximal to the diseased tissue and distal. The fascia was released proximally and the diseased tissue was then dissection out carefully distally. The neurovascular structures on both the ulnar and radial sides of the thumb were identified and protected throughout. There were some very small nerve branches, which were sacrificed distally. The deceased tissue was removed in its  entirety. The tendon sheath was intact. The nerves were intact. The wound was irrigated and closed with 4-0 nylon sutures. Marcaine 0.5% was placed in the wound.   Next, a zigzag incision was made in the palm, starting just distal to Guyon canal and extending out onto the small finger. Again, the diseased tissue was densely carried to the skin and had to be peeled off carefully. This was done to expose it throughout the length of the wound. It was dissected in the middle and then dissected distally. Both neurovascular structures to the ring and small finger were identified and protected throughout. Diseased tissue was dissected out to the PIP joint of the small finger. The nerves and neurovascular bundles were protected throughout and were intact. The diseased tissue was then dissected proximally, back to Guyon canal and the carpal canal. This was amputated at this level. Both neurovascular structures were intact.   The wound was irrigated. The skin was closed with 5-0 nylon sutures. Marcaine 0.5% was placed in the wound. Marcaine 0.5% was placed in the ring finger wound. Dry sterile dressing with volar splint was applied, and the tourniquet was deflated with good return of blood flow to all fingers.   The patient was awakened and taken to recovery in good condition.    ____________________________ Valinda Hoar, MD hem:MT D: 11/18/2014 10:12:36 ET T: 11/18/2014 14:44:17 ET JOB#: [id number]  cc: Valinda Hoar, MD, <Dictator> Valinda Hoar MD ELECTRONICALLY SIGNED 11/20/2014 13:08

## 2015-03-21 NOTE — Consult Note (Signed)
General Aspect Primary Cardiologist: Dr. 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 He was at the surgeon's office on 11/24/2014 with several day history of RLQ pain and underwent CT of abdomen that showed abdominal abscess. He was admitted for further evaluation and treatment. Upon his arrival to [facility] he was found to have a narrow complex tachycardia with rates into the 150s c/w SVT, followed by 50 minute episode of new onset a-fib with RVR this morning.  Cardiology was consulted for further evaluation.  _____________________  PMH: 1. Nonobstructive CAD by cardiac cath 2009 (2009 nonobs dzs, LAD 20, EF 40-45%;  b. 07/2012 Ex MV: EF 55%, freq pvc's, no ischemia/infarct) 2. NICM/chronic systolic CHF (Prev EF as low as 35-40%;?? b. 03/2012 Echo: EF 50-55%, Gr 1 DD, mild MR; c. 06/2014 EF 50-55%, GR1DD, mild MR, mildly dilated LA, normal RVSP) 3. SVT, ventricular bigeminy 4. Dizziness  Surgical history: 1. Laproscopic appendectomy 2. Hand surgery 3. TKA ____________________   Present Illness 74 year old male with the above problem list who presented to [facility] in 11/24/2014 after under going laproscopic appendectomy on 10/31/2014 with acute abdominal abscess, SVT, and new onset a-fib with RVR, rates into the 120s. Patient has a history of NICM with a previous ejection fraction of 35-40%. Most recently it was 50-55% on echocardiogram in August of 2015. Catheterization from 2009 had demonstrated an ejection fraction of 40-45% range with a mild nonobstructive 20% plaque in his LAD. ?? He was noted to have frequent premature beats on physical in 2013. He underwent evaluation with a Holter monitor which showed  frequent PVCs some in the form of bigeminy. 21% of beats were?? PVCs. His echocardiogram at that time (03/2012) showed low normal LV systolic function with an ejection fraction of 50-55% with mild mitral regurgitation. He was switched from bisoprolol to Toprol and added magnesium supplement twice daily. Since then, he had significant improvement in his symptoms. A treadmill nuclear stress test which showed no evidence of ischemia with normal ejection fraction. He did well up until the summer of 2015 when he began to notice an increase in the number of tachypalpitations. Labs were unremarkable at that time. He wore a 21 day event monitor that showed NSR with PVCs (frequent at times). There was no evidence of arrhythmia.   He underwent laproscopic appendectomy on 10/31/2014 secondary to acute gangrenous appendicitis. He was noticing RLQ pain and presented to his surgeon's office for follow up. CT scan showed abdominal abscess. He was advised to come to [facility] for further evaluation and treatment. Upon his arrival to [facility] he was found to tachycardic with rates into the 150s. Narrow complex tachycardia. He received IV diltiazem with improvement in his rates. Telemetry upon him reaching the floor indicates a rhythm of SVT initially with rates into the 150s for about an hour, followed by conversion to NSR. This was followed by devloping SVT again around 5 AM with rates around 150 for abour 20 minutes. Following this he went into new onset a-fib with RVR with rates into the 120s for about 50 minutes before converting to NSR. He was stable throughout. He was able to feel palpitations at times, but not the whole time and not while in the outpatient setting. He received one 400 mg amiodarone tab. He converted  to NSR prior to going for procedure today.   Physical Exam:  GEN well developed, well nourished, no acute distress   HEENT hearing intact to voice, moist oral mucosa   NECK supple   RESP normal resp effort   clear BS   CARD Regular rate and rhythm  No murmur   ABD positive tenderness  soft  RLQ   LYMPH negative neck   EXTR negative edema   SKIN normal to palpation   NEURO cranial nerves intact, motor/sensory function intact   PSYCH alert, A+O to time, place, person   Review of Systems:  Subjective/Chief Complaint palpitations   General: Fatigue  Fever/chills   Skin: No Complaints   ENT: No Complaints   Eyes: No Complaints   Neck: No Complaints   Respiratory: No Complaints   Cardiovascular: Palpitations   Gastrointestinal: abdominal pain   Genitourinary: No Complaints   Vascular: No Complaints   Musculoskeletal: No Complaints   Neurologic: No Complaints   Hematologic: No Complaints   Endocrine: No Complaints   Psychiatric: No Complaints   Review of Systems: All other systems were reviewed and found to be negative   Medications/Allergies Reviewed Medications/Allergies reviewed   Family & Social History:  Family and Social History:  Family History Coronary Artery Disease   Social History negative tobacco, negative ETOH, negative Illicit drugs   Place of Living Home      NICM:    svt:    left hand surg:          Admit Diagnosis:   ABD ABSCESS CT ABD PELVIS ABD PAIN FEVER: Onset Date: 24-Nov-2014, Status: Active, Description: ABD ABSCESS CT ABD PELVIS ABD PAIN FEVER  Home Medications: Medication Instructions Status  omeprazole 20 mg oral delayed release capsule 1 cap(s) orally once a day (in the morning) before meal Active  metoprolol 25 mg oral tablet, extended release 1 tab(s) orally once a day with evening meal Active  Chondroitin-Glucosamine 1 tab(s)  1200/1500mg orally 2 times a day Active  gabapentin 400 mg oral capsule one orally twice a day Active  Norco 325 mg-5 mg oral tablet one or two orally every four to six hours as needed for pain Active  aspirin 81 mg oral tablet 1 tab(s) orally once a day (at bedtime) Active  Avodart 0.5 mg  oral capsule 1 cap(s) orally once a day Active  multivitamin 1 tab(s) orally once a day Active  magnesium oxide 400 mg oral tablet 1 tab(s) orally 2 times a day Active  meloxicam 15 mg oral tablet 1 tab(s) orally once a day Active   Lab Results:  Routine Chem:  05-Jan-16 01:49   Magnesium, Serum  1.5 (1.8-2.4 THERAPEUTIC RANGE: 4-7 mg/dL TOXIC: > 10 mg/dL  -----------------------)    04:29   Glucose, Serum  111  BUN 11  Creatinine (comp) 1.11  Sodium, Serum  135  Potassium, Serum 4.3  Chloride, Serum 100  CO2, Serum 27  Calcium (Total), Serum  8.3  Anion Gap 8  Osmolality (calc) 270  eGFR (African American) >60  eGFR (Non-African American) >60 (eGFR values <57m/min/1.73 m2 may be an indication of chronic kidney disease (CKD). Calculated eGFR, using the MRDR Study equation, is useful in  patients with stable renal function. The eGFR calculation will not be reliable in acutely ill patients when serum creatinine is changing rapidly. It is not useful in patients on dialysis. The eGFR calculation may not be applicable to patients at the low and high extremes of body sizes,  pregnant women, and vegetarians.)  Routine Coag:  05-Jan-16 04:29   Prothrombin  15.7  INR 1.3 (INR reference interval applies to patients on anticoagulant therapy. A single INR therapeutic range for coumarins is not optimal for all indications; however, the suggested range for most indications is 2.0 - 3.0. Exceptions to the INR Reference Range may include: Prosthetic heart valves, acute myocardial infarction, prevention of myocardial infarction, and combinations of aspirin and anticoagulant. The need for a higher or lower target INR must be assessed individually. Reference: The Pharmacology and Management of the Vitamin K  antagonists: the seventh ACCP Conference on Antithrombotic and Thrombolytic Therapy. FXTKW.4097 Sept:126 (3suppl): [id number]. A HCT value >55% may artifactually increase the PT.  In  one study,  the increase was an average of 25%. Reference:  "Effect on Routine and Special Coagulation Testing Values of Citrate Anticoagulant Adjustment in Patients with High HCT Values." American Journal of Clinical Pathology 2006;126:400-405.)  Activated PTT (APTT)  45.3 (A HCT value >55% may artifactually increase the APTT. In one study, the increase was an average of 19%. Reference: "Effect on Routine and Special Coagulation Testing Values of Citrate Anticoagulant Adjustment in Patients with High HCT Values." American Journal of Clinical Pathology 2006;126:400-405.)  Routine Hem:  05-Jan-16 04:29   WBC (CBC) 10.2  RBC (CBC)  3.41  Hemoglobin (CBC)  10.2  Hematocrit (CBC)  29.8  Platelet Count (CBC) 261  MCV 88  MCH 30.0  MCHC 34.3  RDW 13.1  Neutrophil % 79.4  Lymphocyte % 10.4  Monocyte % 9.1  Eosinophil % 0.4  Basophil % 0.7  Neutrophil #  8.1  Lymphocyte # 1.1  Monocyte # 0.9  Eosinophil # 0.0  Basophil # 0.1 (Result(s) reported on 24 Nov 2014 at 05:06AM.)   EKG:  EKG Interp. by me   Interpretation EKG shows NSR with no significant ST or T wave changes Tele shows SVT on arrival to the floor, recurrent SVT at 5:00 AM for 20 min, then atrial fib for 50 min, then NSR at 6:50 AM   Radiology Results:  CT:    04-Jan-16 18:36, CT Abdomen and Pelvis With Contrast  CT Abdomen and Pelvis With Contrast   REASON FOR EXAM:    labs 1st  abdominal pain and fever  COMMENTS:       PROCEDURE: CT  - CT ABDOMEN / PELVIS  W  - Nov 23 2014  6:36PM     CLINICAL DATA:  Acute onset of fever and generalized abdominal pain.  Sepsis. Recent appendectomy. Initial encounter.    EXAM:  CT ABDOMEN AND PELVIS WITH CONTRAST    TECHNIQUE:  Multidetector CT imaging of the abdomen and pelvis was performed  using the standard protocol following bolus administration of  intravenous contrast.  CONTRAST:  100 mL of Omnipaque 300 IV contrast    COMPARISON:  CT of the abdomen and pelvis  performed 10/30/2014    FINDINGS:  The visualized lung bases are clear.    Tiny scattered hypodensities are again noted within the liver,  nonspecific in appearance. The spleen is unremarkable in appearance.  The gallbladder is unremarkable. The gallbladder is within normal  limits. The pancreas and adrenal glands are unremarkable.    A 3.0 cm cyst is noted at the upper pole of the left kidney.  Scattered nonobstructing bilateral renal stones are seen, measuring  up to 6 mm in size. Nonspecific perinephric stranding is noted  bilaterally. The kidneys are otherwise grossly unremarkable. There  is  no evidence of hydronephrosis.    The proximal small bowel is unremarkable in appearance.The stomach  is within normal limits. No acute vascular abnormalities are seen.  Minimal scattered calcification is seen along the abdominal aorta.    Status post appendectomy, there is an unusual complex peripherally  enhancing abscess at the right lower quadrant, at the surgical site,  with surrounding phlegmon and inflammation of adjacent fat. The  abscess and phlegmon measures approximately 7.2 x 3.9 x 5.1 cm;  abscess components are difficult to fully differentiate from  surrounding inflammation. There is associated diffuse wall  thickening involving the cecum. Adjacent postoperative change is  seen. A leak cannot be excluded, though no free fluid is seen in the  remainder of the abdomen and pelvis.    Mildly prominent pericecal nodes are seen. There is mild  inflammation and displacement of the terminal ileum.    The bladder is mildly distended and grossly unremarkable. The  prostate is enlarged, measuring 5.2 cm in transverse dimension. No  inguinal lymphadenopathy is seen.    No acute osseous abnormalities are identified. Facet disease is  noted along the lower lumbar spine, with multilevel vacuum  phenomenon.     IMPRESSION:  1. Unusual complex abscess at the right lower quadrant, at  the  surgical site, with surrounding phlegmon and inflammation of  adjacent fat. Abscess and phlegmon measures approximately 7.2 x 3.9  x 5.1 cm; abscess components are difficult to fully differentiate  from surrounding inflammation, though definite fluid components are  seen. Diffuse wall thickening involving the cecum, and mild  inflammation and displacement of the terminal ileum. Cannot exclude  localized leak given the presence of an abscess, though no free  fluid is seen in the remainder of the abdomen and pelvis.  2. Mildly prominent pericecal nodes noted.  3. Enlarged prostate seen.  4. Nonspecific tiny hypodensities again noted within the liver. Left  renal cyst seen.  5. Scattered nonobstructing bilateral renal stones noted.    These results were called by telephone at the time of interpretation  on 11/23/2014 at 7:07 pm to Dr. Burt Knack, who verbally acknowledged  these results.      Electronically Signed    By: Garald Balding M.D.    On: 11/23/2014 19:10         Verified By: JEFFREY . CHANG, M.D.,    05-Jan-16 16:00, CT Guide for Abscess Drainage (Specify)  CT Guide for Abscess Drainage (Specify)   REASON FOR EXAM:    rlq postop appy abscess  COMMENTS:       PROCEDURE: CT  - CT GUIDED ABSCESS DRAINAGE  - Nov 24 2014  4:00PM     INDICATION:  Right lower quadrant abscess status post appendectomy.    EXAM:  CT GUIDED ABCESS DRAINAGE WITH CATHETER    COMPARISON:  CT scan of November 23, 2014.    MEDICATIONS:  Fentanyl 100 mcg IV; Versed 2.0 mg IV  ANESTHESIA/SEDATION:  Total Moderate Sedation Time    35 minutes    CONTRAST:  Oral contrast was given.    COMPLICATIONS:  None immediate.    TECHNIQUE:  Informed written consent was obtained from [place] after a  discussion of the risks, benefits and alternatives to treatment. The  patient was placed supine on the CT gantry and a pre procedural CT  was performed re-demonstrating the known abscess/fluid  collection  within the right lower quadrant of the abdomen. The procedure was  planned. A timeout  was performed prior to the initiation of the  procedure.    The right lower quadrant of the abdomen was prepped and draped in  the usual sterile fashion. The overlying soft tissues were  anesthetized with 1% lidocaine with epinephrine. Appropriate  trajectory was planned with the use of a 22 gauge spinal needle. An  18 gauge trocar needle was advanced into the abscess/fluid  collection anda short Amplatz super stiff wire was coiled within  the collection. Appropriate positioning was confirmed with a limited  CT scan. The tract was serially dilated allowing placement of a {8}  [place] all-purpose drainage catheter. Appropriate positioning was  confirmed with a limited postprocedural CT scan.  100 ml of {purulent} fluid was aspirated. The tube was connected to  a {JP suction bulb} and sutured in place. A dressing was placed. The  patient tolerated the procedure well without immediatepost  procedural complication.     IMPRESSION:  Successful CT guided placement of a 8 [place] all purpose drain  catheter into the {right lower quadrant abscess} with aspiration of  100 mL of purulent fluid. Samples were sent to the laboratory as  requested by the ordering clinical team.      Electronically Signed    By: Sabino Dick M.D.    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 He was at the surgeon's office on 11/24/2014 with several day history of RLQ pain and underwent CT of abdomen that showed abdominal abscess. He was admitted for further evaluation and treatment. Upon his arrival to [facility] he was found to have a narrow complex tachycardia with rates into the 150s c/w SVT, followed by 50 minute episode of new onset a-fib with RVR this morning. Cardiology was consulted for further evaluation.   1. Arrhythmia:  SVT and atrial fibrillation -Rhythm upon presentation c/w SVT lasting approximately 50 minutes before converting to NSR.  Patient then develops SVT again around 5 AM, followed by the development of new onset a-fib with rates into the 120s lasting approximately 20 minutes before converting to NSR with rates into the 70s. Apparently he went back into arrhythmia since we saw him this morning (this is not to be unexpected given his infection/stress on the heart) -The above is possibly 2/2 the stressors in place related to his abdominal infection, placing him at increased risk for further arrhythmia until the infection resolves  -He was given a loading dose of amiodarone 400 mg po which converted him back into NSR - he has IV infusion ordered should he need this  -Would like to maintian NSR in an effort to avoid anticoagulation  -Pressures have been somewhat soft, limiting titration of b-blocker -K+ 4.1-->4.3 -Mg 1.5 (has been given mag sulfate  injection) -Plan is for amiodarone 400 mg 2 doses today, then amiodarone 400 mg po BID tomorrow  2. Abdominal abscess: -Possible drainage this afternoon  -ABX per IM -As above  3. HTN: -BP soft -Monitor  4. Hx of NICM prior EF 35%, most recently 55% Appears to be euvolemic Will monitor IVF  carefully  5) GERD on PPI  6) BPH on avodart   Electronic Signatures: Rise Mu (PA-C)  (Signed 05-Jan-16 12:23)  Authored: General Aspect/Present Illness, History and Physical Exam, Review of System, Family & Social History, Past Medical History, Home Medications, Labs, EKG , Radiology, Allergies, Impression/Plan Ida Rogue (MD)  (Signed 05-Jan-16 21:04)  Authored: General Aspect/Present Illness, History and Physical Exam, Review of System, Family & Social History, Past Medical History, Health Issues, EKG , Radiology, Vital Signs/Nurse's Notes, Impression/Plan  Co-Signer: General Aspect/Present Illness, History and Physical Exam, Review of System, Family & Social History, Past Medical History, Home Medications, Labs, EKG , Radiology, Allergies, Impression/Plan   Last Updated: 05-Jan-16 21:04 by Ida Rogue (MD)

## 2015-03-21 NOTE — Consult Note (Signed)
PATIENT NAME:  Vincent [name], Vincent [name] MR#:  [id number] DATE OF BIRTH:  January 21, 1941  DATE OF CONSULTATION:  11/24/2014  REFERRING PHYSICIAN:     Adah Salvage. Excell Seltzer, MD   CONSULTING PHYSICIAN:  Cletis Athens. Leaman Abe, MD  PRIMARY CARE PHYSICIAN: [name], MD of [facility].  HISTORY OF PRESENT ILLNESS: This is a 74 year old Caucasian gentleman with history of hypertension, BPH, self-reported atrial fibrillation, who recently underwent appendectomy, noted to be gangrenous on 10/31/2014, and discharged on 11/10/2014 to finish a course of Augmentin. He represents to the hospital on 11/24/2014 with multiple days of right lower quadrant pain. He apparently was seen at the surgeon's office and then had a CAT scan performed and admitted to the hospital. Subsequently, from the CAT scan, he was noted to have a fairly large abscess. He was started on ertapenem thus far. I was called by the nursing staff a STAT consult regarding tachycardia. His heart rate trends. Upon admission, he had normal sinus rhythm, heart rate of 92. An hour later, heart rate jumped up to 164 and remained elevated for the next hour and a half until I was called. Blood pressure stable throughout. Case discussed with telemetry. Upon walking the patient's bedside, states it appears to be irregular, irregular consistent with atrial fibrillation. The patient evaluated at bedside. Currently with minimal complaints. Denies any chest pain or shortness of breath. Still positive for abdominal pain as stated above.   REVIEW OF SYSTEMS:  CONSTITUTIONAL: Denies fevers, chills. Positive for weakness, fatigue.  EYES: Denies blurred vision, double vision, eye pain.  EARS, NOSE, THROAT: Denies tinnitus, ear pain, hearing loss.  RESPIRATORY: Denies cough, wheeze, shortness of breath.  CARDIOVASCULAR: Denies chest pain, palpitations, edema.  GASTROINTESTINAL: Denies any nausea, vomiting, diarrhea. Positive for abdominal pain as described above.  GENITOURINARY:  Denies dysuria or hematuria.  ENDOCRINE: Denies nocturia or thyroid problems.  HEMATOLOGIC AND LYMPHATIC: Denies easy bruising or bleeding.  SKIN: Denies rash or lesions.  MUSCULOSKELETAL: Denies pain in neck, back, shoulder, knees, hips, or arthritic symptoms.  NEUROLOGIC: Denies any paralysis or paresthesias.  PSYCHIATRIC: Denies anxiety or depressive symptoms.   Otherwise, full review of systems performed by me is negative.  PAST MEDICAL HISTORY: Obtained from the patient, as well as documentation, include gastroesophageal reflux disease, BPH, hypertension, self-reported atrial fibrillation; however, has no medications or documentation suggestive of such. Recent appendectomy on 10/31/2014, which was noted to be gangrenous.   SOCIAL HISTORY: Denies any alcohol, tobacco, or drug use.  FAMILY HISTORY: Denies any known cardiovascular disorders.   ALLERGIES: No known drug allergies.   HOME MEDICATIONS: Include aspirin 81 mg p.o. daily, Avodart 0.5 mg p.o. daily, chondroitin glucosamine 1200/1500 mg p.o. b.i.d., gabapentin 400 mg p.o. b.i.d., magnesium oxide 400 mg p.o. b.i.d., meloxicam 15 mg p.o. daily, metoprolol 25 mg extended release p.o. daily, multivitamin 1 tab p.o. daily, Prilosec 20 mg p.o. daily, Norco 325/5 mg p.o. every 4 to 6 hours as needed for pain.   PHYSICAL EXAMINATION:  VITAL SIGNS: Temperature 99.6, heart rate on my arrival 155, respirations 18, blood pressure 103/66, saturating 97% on room air. Repeat after intervention, heart rate down to 94, blood pressure 96/61.  GENERAL: Ill-appearing Caucasian male currently in minimal distress given tachycardia and abdominal pain.  HEAD: Normocephalic, atraumatic.  EYES: Pupils equal, round, reactive to light. Extraocular muscles are intact. No scleral icterus.  MOUTH: Moist mucosal membranes. Dentition intact. No abscess noted. EARS, NOSE, THROAT: Clear without exudates. No external lesions.  NECK: Supple. No thyroid  pain. No  nodules. No JVD.  PULMONARY: Clear to auscultation bilaterally without wheezes, rales, rhonchi. No use of accessory muscles. Good respiratory effort.  CHEST: Nontender to palpation. CARDIOVASCULAR: S1, S2, tachycardic. No murmurs, rubs, or gallops. No edema. Pedal pulses 2+ bilaterally.  GASTROINTESTINAL: Soft, minimal distention, tenderness to palpation over right lower quadrant without rebound or guarding. Positive bowel sounds. No hepatosplenomegaly.  MUSCULOSKELETAL: No swelling, clubbing, or edema. Range of motion full in all extremities.  NEUROLOGIC: Cranial nerves II through XII intact. No gross focal neurological deficits. Sensation intact. Reflexes intact.  SKIN: No ulcerations, lesions, rashes. Skin warm, dry. Turgor intact.  PSYCHIATRIC: Mood and affect within normal limits. He is awake and oriented x 3.  Insight and judgment intact.   LABORATORY DATA: CT abdomen and pelvis performed revealing a 7.2 x 3.9 x 5.1 cm abscess, which is complex with surrounding phlegmon and inflammation. No further labs have been obtained at this point. He had an EKG STAT, read at bedside, narrow complex tachycardia, which is regular with occasional PACs.   ASSESSMENT AND PLAN: A 74 year old gentleman with recent appendectomy noted to be gangrenous, represents and had a large right lower quadrant abscess. STAT consult called for tachycardia. 1.  Narrow complex tachycardia: Discussed the case with telemetry technician. As I walked to the patient's bedside, it appeared to be atrial fibrillation. At that time, I ordered 10 mg of intravenous Cardizem. I got an EKG while at bedside. Narrow complex tachycardia with occasional premature atrial contractions, otherwise it is actually regular. He did, however,  receive the Cardizem. Heart rate improved, now normal sinus rhythm in the 80s. Blood pressure is stable. He does report a atrial fibrillation; however, no documentation or medications suggestive of this. He was not  in a atrial fibrillation at this time. He does follow with [facility] with Dr. Kirke Corin. We will get labs now, including BMP, CBC, and blood cultures, as well as give fluids, 1 liter bolus normal saline. Question if he could actually be septic, however, has no laboratories or anything performed yet.  2.  Abdominal abscess, 7.2 x 3.9 x 5.1 cm. He is on ertapenem at this point, which should provide adequate coverage for complicated intra-abdominal infection. We will get blood cultures, CBC, BMP as stated above. Intravenous fluid hydration. Otherwise, defer care to surgery and radiology.  3.  Hypertension. Continue with Toprol.  4.  Venous thromboembolism prophylaxis: We will defer to surgery. However, currently on heparin.   CODE STATUS: Patient is FULL CODE.   CRITICAL CARE TIME SPENT: 35 minutes.     ____________________________ Cletis Athens. Aero Drummonds, MD dkh:ts D: 11/24/2014 02:20:42 ET T: 11/24/2014 04:19:38 ET JOB#: [id number]  cc: Cletis Athens. Liane Tribbey, MD, <Dictator> Chasey Dull Synetta Shadow MD ELECTRONICALLY SIGNED 11/25/2014 0:14

## 2015-03-21 NOTE — Consult Note (Signed)
Brief Consult Note: Diagnosis: SVT and new onset a-fib in the setting of abdominal abscess s/p appendectomy.   Patient was seen by consultant.   Orders entered.   Discussed with Attending MD.   Comments: Patient reached the floor with HR in the 150s with rhythm of SVT lasting approximately 50 minutes before converting back to NSR. Around 5 AM patient developed SVT again with HR around 150s lasted approximately 20 minutes, however this time before converting back to NSR he developed new onset a-fib lasting approximately 50 mintues with a rate in the 120s before converting to NSR with a rate in the 70s. His pressures are somewhat soft this morning limiting titration of b-blocker. He is at high risk of further cardiac irritability/arrhythmia. We have added amiodarone laoding dose in prep for his planned procedure this afternoon at this time. He is cleared for procedure at moderate cardiac risk. Full consult note to follow.  Electronic Signatures: Sondra Barges (PA-C)   (Signed 05-Jan-16 09:03)  Authored: Brief Consult Note Julien Nordmann (MD)   (Signed 06-Jan-16 07:41)  Co-Signer: Brief Consult Note  Last Updated: 06-Jan-16 07:41 by Julien Nordmann (MD)

## 2015-03-21 NOTE — Consult Note (Signed)
Brief Consult Note: Diagnosis: tachy.   Patient was seen by consultant.   Consult note dictated.   Orders entered.   Comments: 73yCM pmh HTN, BPH, recent appy, gangrenous 10/31/14 - dc 11/10/14, represent 11/24/14 with multiple days RLQ pain. apparently had CT scan, findings of abscess, then admitted started on ertapenem called by nursing staff for STAT consult regarding tachycardia. HR NSR 92 on admission, up to 164 0:21 remain elevated next 1.5hrs BP stable throughout  1. narrow complex tachycardia: discussed with tele, appeared to be Afib, ordered 10mg  IV cardizem. formal EKG obtain SVT with PAC, received cardizem HR improved NSR 80s, BP stable. self reported history of Afib (no documentation or meds suggestive) follows with [facility].  get labs BMP,CBC, Blood Cx now. as not done give 1L NS bolus now.  2. abdominal abscess:  7.2 x 3.9x 5.1 cm; abscess, ertapenem - good coverage complicated IAI, get blood cx and labs as above, ivf, per surg/radio  3. htn: toprol  4. vte px: defer surg - hep subcutaneous currently  critical  [id number].  Electronic Signatures: Wyatt Haste (MD)  (Signed 05-Jan-16 02:23)  Authored: Brief Consult Note   Last Updated: 05-Jan-16 02:23 by Wyatt Haste (MD)

## 2015-03-21 NOTE — H&P (Signed)
   Subjective/Chief Complaint RLQ pain   History of Present Illness sent from office for CT scan seen in rads approx 1930 then on floor 2200 for complete H&P Several days rt flank pain then worsening RLQ pain and fevers discussed findings on CT with radiologist who suggested drainage prior appy for gangrenous appy, IV abx then oral abx ten days.   Past History HTN, BPH recent lap appy, also had recent rt dupetryn's contsracture release   Past Med/Surgical Hx:  denies med hx:   left hand surg:   ALLERGIES:  No Known Allergies:   Family and Social History:  Family History Non-Contributory   Social History negative tobacco, retired Airline pilot   Review of Systems:  Fever/Chills No   Cough No   Abdominal Pain Yes   Diarrhea No   Constipation No   Nausea/Vomiting No   SOB/DOE No   Chest Pain No   Dysuria No   Tolerating Diet Yes   Medications/Allergies Reviewed Medications/Allergies reviewed   Physical Exam:  GEN no acute distress   HEENT pink conjunctivae   NECK supple   RESP normal resp effort  clear BS   CARD regular rate   ABD positive tenderness   LYMPH negative neck   EXTR negative edema   SKIN normal to palpation   PSYCH alert, A+O to time, place, person, good insight    Assessment/Admission Diagnosis discussed with radiology, drainage suggested admit, IV abx   Electronic Signatures: Lattie Haw (MD)  (Signed 05-Jan-16 00:32)  Authored: CHIEF COMPLAINT and HISTORY, PAST MEDICAL/SURGIAL HISTORY, ALLERGIES, FAMILY AND SOCIAL HISTORY, REVIEW OF SYSTEMS, PHYSICAL EXAM, ASSESSMENT AND PLAN   Last Updated: 05-Jan-16 00:32 by Lattie Haw (MD)

## 2015-03-21 NOTE — Discharge Summary (Signed)
PATIENT NAME:  Vincent [name], Vincent [name] MR#:  [id number] DATE OF BIRTH:  [birth year]  DATE OF ADMISSION:  11/23/2014 DATE OF DISCHARGE:  11/27/2014  DIAGNOSES: Pelvic abscess post appendectomy, tachycardia, atrial fibrillation, reflux disease, hypertension.   CONSULTANTS: [facility].   PROCEDURES: CT-guided drainage of pelvic abscess.   HISTORY OF PRESENT ILLNESS AND HOSPITAL COURSE: This is a patient who was admitted to the hospital approximately 3 weeks prior with gangrenous appendix and he underwent a laparoscopic appendectomy was treated with IV antibiotics and switched to oral antibiotics on discharge. He finished his course of Augmentin but then started having abdominal pain, which was worsening. He had some high fevers and was seen in the office and admitted after a CT scan suggested pelvic abscess.   On admission the pelvic abscess was noted on CT scan. CT-guided drainage was performed after consultation with the radiologist. He made a prompt recovery, and is discharged on oral antibiotics again with a drain in place to be seen in the office approximately 5 days postdischarge to have a drain evaluated and possibly removed. This was all reviewed for him. He understood and agreed to proceed.    ____________________________ Adah Salvage Excell Seltzer, MD rec:bm D: 12/04/2014 21:42:06 ET T: 12/05/2014 01:31:34 ET JOB#: [id number]  cc: Adah Salvage. Excell Seltzer, MD, <Dictator> Lattie Haw MD ELECTRONICALLY SIGNED 12/05/2014 19:31

## 2015-03-25 ENCOUNTER — Encounter: Payer: Self-pay | Admitting: Cardiovascular Disease

## 2015-04-08 ENCOUNTER — Other Ambulatory Visit: Payer: Self-pay | Admitting: Specialist

## 2015-04-08 DIAGNOSIS — M461 Sacroiliitis, not elsewhere classified: Secondary | ICD-10-CM

## 2015-04-15 ENCOUNTER — Ambulatory Visit
Admission: RE | Admit: 2015-04-15 | Discharge: 2015-04-15 | Disposition: A | Discharge status: Home / Self Care | Payer: Medicare Other | Source: Ambulatory Visit | Attending: Specialist | Admitting: Specialist

## 2015-04-15 DIAGNOSIS — M461 Sacroiliitis, not elsewhere classified: Secondary | ICD-10-CM

## 2015-04-15 DIAGNOSIS — M4806 Spinal stenosis, lumbar region: Secondary | ICD-10-CM | POA: Diagnosis not present

## 2015-06-15 ENCOUNTER — Telehealth: Payer: Self-pay | Admitting: *Deleted

## 2015-06-15 NOTE — Telephone Encounter (Signed)
Forward to Ryan Dunn, PA-C 

## 2015-06-15 NOTE — Telephone Encounter (Signed)
Pt is having back surgery aug 4th and would like to know if he needs to be checked out before than °Please advise. °

## 2015-06-15 NOTE — Telephone Encounter (Signed)
Pt is having back surgery aug 4th and would like to know if he needs to be checked out before than Please advise.

## 2015-06-17 NOTE — Telephone Encounter (Signed)
He is doing well. He would be an acceptable non-cardiac surgical risk. Normal LV function. Continue current medications. Would have surgical team try to limit IV fluids.

## 2015-06-17 NOTE — Telephone Encounter (Signed)
S/w Rhea Belton, que monitor, at [facility]. Stated Alycia Rossetti Dunn's recommendation to limit IV fluids. Sheralyn Boatman states she will get message to surgical team and call if any questions.

## 2015-06-17 NOTE — Telephone Encounter (Signed)
S/w pt of Ryan Dunn's recommendations. Pt verbalized understanding States back surgery 8/3 with Dr. Margit Banda at [facility] in [place]  Will call MD with Ryan's recommendations

## 2015-06-18 ENCOUNTER — Telehealth: Payer: Self-pay

## 2015-06-18 NOTE — Telephone Encounter (Signed)
Faxed clearance letter to [employer] [phone]

## 2015-09-29 ENCOUNTER — Ambulatory Visit: Payer: Medicare Other | Admitting: Nurse Practitioner

## 2015-10-05 ENCOUNTER — Encounter: Payer: Self-pay | Admitting: Nurse Practitioner

## 2015-10-05 ENCOUNTER — Ambulatory Visit (INDEPENDENT_AMBULATORY_CARE_PROVIDER_SITE_OTHER): Payer: Medicare Other | Admitting: Nurse Practitioner

## 2015-10-05 VITALS — BP 110/78 | HR 69 | Ht 70.5 in | Wt 222.2 lb

## 2015-10-05 DIAGNOSIS — I480 Paroxysmal atrial fibrillation: Secondary | ICD-10-CM

## 2015-10-05 DIAGNOSIS — I471 Supraventricular tachycardia: Secondary | ICD-10-CM | POA: Diagnosis not present

## 2015-10-05 NOTE — Progress Notes (Signed)
Patient Name: Vincent [name] Date of Encounter: 10/05/2015  Primary Care Provider:  [name], MD Primary Cardiologist:  Judie Petit. Kirke Corin, MD   Chief Complaint  74 y/o male with a h/o palpitations and PAF who presents for f/u.  Past Medical History   Past Medical History  Diagnosis Date  . History of systolic CHF     a. Prev EF as low as 35-40%;  b. 03/2012 Echo: EF 50-55%, Gr 1 DD, mild MR;  c. 06/2014 Echo: EF 50-55%, gr 1 DD, mild MR, mildly dil LA.  . Dizziness and giddiness   . History of NICM     a. Prev EF as low as 35-40%;  b. 03/2012 Echo: EF 50-55%, Gr 1 DD, mild MR;  c. 06/2014 Echo: EF 50-55%, gr 1 DD, mild MR, mildly dil LA.  . Paroxysmal supraventricular tachycardia (HCC)     a. SVT/PAT  . Non-obstructive CAD     a. 2009 nonobs dzs, LAD 20, EF 40-45%;  b. 07/2012 Ex MV: EF 55%, freq pvc's, no ischemia/infarct.  [name] PVC's (premature ventricular contractions)     a. Asymptomatic - managed with Toprol and Mg.  [name] PAF (paroxysmal atrial fibrillation) (HCC)     a. 11/2013 - occurred 3 wks post-op Appe in setting of pelvic abscess-->amio-->converted but amio later d/c'd 2/2 hypothyroidism.  [name] PSVT (paroxysmal supraventricular tachycardia) (HCC)     a. 11/2013.   Past Surgical History  Procedure Laterality Date  . Hand surgery  1999  . Total knee arthroplasty    . Appendectomy    . Abscess drainage      Allergies  No Known Allergies  HPI  74 y/o male with a h/o palpitations and PVC's.  In 11/2014, he underwent an appendectomy in the setting of gangrenous appendicitis.  Three weeks later he was readmitted with c/o abdominal pain and was found to be in SVT and subsequently AF.  He was readmitted for mgmt of a pelvic abscess and was placed on amio with conversion to sinus rhythm.  Amio was later discontinued secondary to development of hypothyroidism and the absence of any recurrent AF.  He has never been on OAC.  Since his last office visit, he has done well.  He  underwent back surgery over the summer without complication.  He has been taking PT and has developed right sided sciatic nerve pain for which he is currently being evaluated by ortho.  He has not had any chest pain, dyspnea, palpitations, pnd, orthopnea, dizziness, syncope, or edema.  Home Medications  Prior to Admission medications   Medication Sig Start Date End Date Taking? Authorizing Provider  aspirin 81 MG EC tablet Take 81 mg by mouth daily.     Yes Historical Provider, MD  diclofenac (VOLTAREN) 75 MG EC tablet TAKE 1 TABLET(S) TWICE A DAY BY ORAL ROUTE. 08/04/15  Yes Historical Provider, MD  dutasteride (AVODART) 0.5 MG capsule Take 0.5 mg by mouth daily.    Yes Historical Provider, MD  levothyroxine (SYNTHROID, LEVOTHROID) 25 MCG tablet Take 25 mcg by mouth daily before breakfast.   Yes Historical Provider, MD  Magnesium Oxide 400 (240 MG) MG TABS Take 1 tablet by mouth 2 (two) times daily. 06/20/12  Yes Iran Ouch, MD  metoprolol succinate (TOPROL-XL) 25 MG 24 hr tablet TAKE 1 TABLET DAILY 03/05/15  Yes Antonieta Iba, MD  Multiple Vitamin (MULTIVITAMIN) tablet Take 1 tablet by mouth daily.     Yes Historical Provider, MD  omeprazole (PRILOSEC)  20 MG capsule Take 20 mg by mouth daily.     Yes Historical Provider, MD    Review of Systems  Doing well.  Sciatic left lower extremity pain, which is being worked up.  He denies chest pain, palpitations, dyspnea, pnd, orthopnea, n, v, dizziness, syncope, edema, weight gain, or early satiety.  All other systems reviewed and are otherwise negative except as noted above.  Physical Exam  VS:  BP 110/78 mmHg  Pulse 69  Ht 5' 10.5" (1.791 m)  Wt 222 lb 4 oz (100.812 kg)  BMI 31.43 kg/m2 , BMI Body mass index is 31.43 kg/(m^2). GEN: Well nourished, well developed, in no acute distress. HEENT: normal. Neck: Supple, no JVD, carotid bruits, or masses. Cardiac: RRR, no murmurs, rubs, or gallops. No clubbing, cyanosis, edema.  Radials/DP/PT  2+ and equal bilaterally.  Respiratory:  Respirations regular and unlabored, clear to auscultation bilaterally. GI: Soft, nontender, nondistended, BS + x 4. MS: no deformity or atrophy. Skin: warm and dry, no rash. Neuro:  Strength and sensation are intact. Psych: Normal affect.  Accessory Clinical Findings  ECG - RSR, 69, PVC, no acute st/t changes.  Assessment & Plan  1.  PAF:  No recent palpitations.  In sinus on bb, which he tolerates.  He has never been on anticoagulation as PAF was limited to post-op setting (appe followed by pelvic abscess).  2.  PSVT:  As above, no recent palpitations.  Cont bb.  3.  H/O NICM:  Recovered LV fxn - last EF 50-55% by echo in 06/2014.  Cont bb.  4.  Disposition:  F/u Dr. Kirke Corin in 1 year or sooner if necessary.   Nicolasa Ducking, NP 10/05/2015, 4:12 PM

## 2015-10-05 NOTE — Patient Instructions (Signed)
Medication Instructions:  Your physician recommends that you continue on your current medications as directed. Please refer to the Current Medication list given to you today.   Labwork: none  Testing/Procedures: none  Follow-Up: Your physician wants you to follow-up in: one year with Dr. Arida.  You will receive a reminder letter in the mail two months in advance. If you don't receive a letter, please call our office to schedule the follow-up appointment.   Any Other Special Instructions Will Be Listed Below (If Applicable).     If you need a refill on your cardiac medications before your next appointment, please call your pharmacy.   

## 2016-02-06 IMAGING — CT CT GUIDED ABCESS DRAINAGE WITH CATHETER
3 of 9 series · 3 of 16 positions shown, 4 images · non-contrast
Comparison: CT scan of November 23, 2014.

INDICATION: Right lower quadrant abscess status post appendectomy.

EXAM:
CT GUIDED ABCESS DRAINAGE WITH CATHETER
TECHNIQUE: Informed written consent was obtained from MOOLMAN, KLPIGBB after a
discussion of the risks, benefits and alternatives to treatment. The
patient was placed supine on the CT gantry and a pre procedural CT
was performed re-demonstrating the known abscess/fluid collection
within the right lower quadrant of the abdomen. The procedure was
planned. A timeout was performed prior to the initiation of the
procedure.

[Series 3: routine abd pel with · axial · 0.81mm/px · z∈[+770,+770]mm · 1 of 52 slices shown, 2 images]
[im 1/52  soft-tissue]
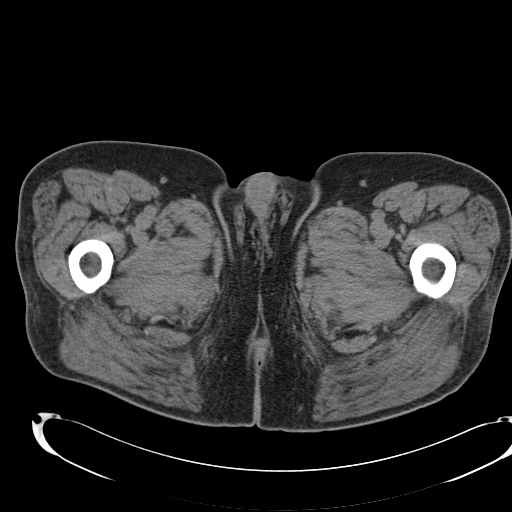
[im 1/52  bone]
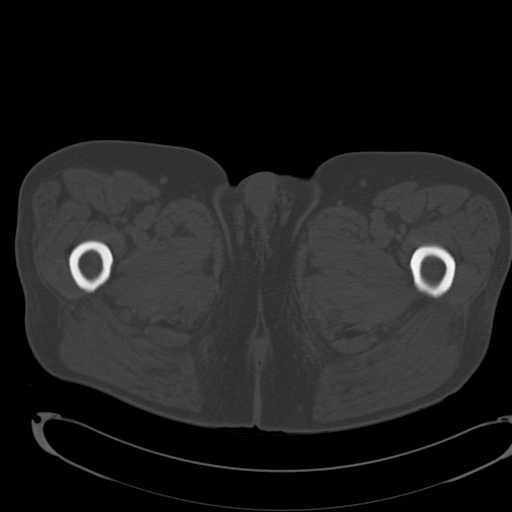

[Series 5: cor routine abd pel with · coronal · 0.59mm/px · 1 of 151 slices shown]
[im 76/151  soft-tissue]
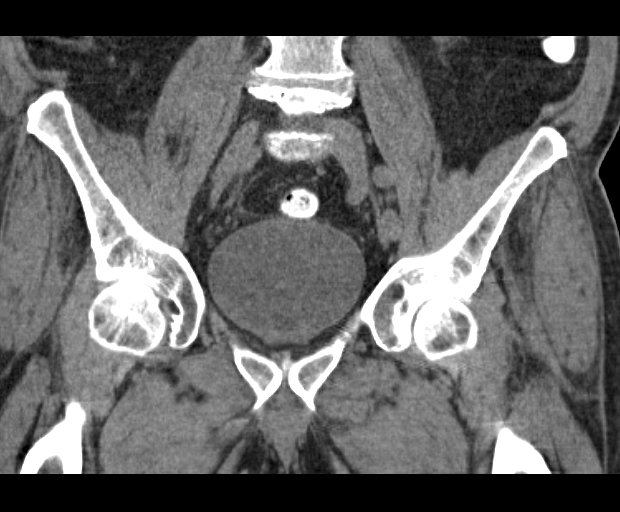

[Series 6: sag routine abd pel with · sagittal · 0.68mm/px · 1 of 192 slices shown]
[im 77/192  soft-tissue]
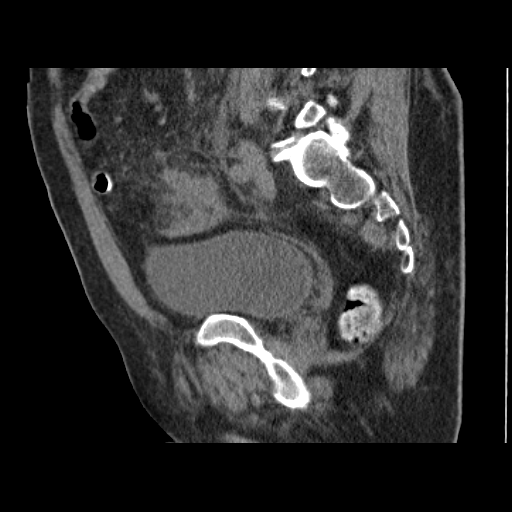

[3 of 16 positions shown; findings below may reference images not displayed]

MEDICATIONS:
Fentanyl 100 mcg IV; Versed 2.0 mg IV

ANESTHESIA/SEDATION:
Total Moderate Sedation Time

35 minutes

CONTRAST:  Oral contrast was given.

COMPLICATIONS:
None immediate.
The right lower quadrant of the abdomen was prepped and draped in
the usual sterile fashion. The overlying soft tissues were
anesthetized with 1% lidocaine with epinephrine. Appropriate
trajectory was planned with the use of a 22 gauge spinal needle. An
18 gauge trocar needle was advanced into the abscess/fluid
collection and a short Amplatz super stiff wire was coiled within
the collection. Appropriate positioning was confirmed with a limited
CT scan. The tract was serially dilated allowing placement of a {8}
French all-purpose drainage catheter. Appropriate positioning was
confirmed with a limited postprocedural CT scan.

100 ml of {purulent} fluid was aspirated. The tube was connected to
a {BIRSHET suction bulb} and sutured in place. A dressing was placed. The
patient tolerated the procedure well without immediate post
procedural complication.
IMPRESSION: Successful CT guided placement of a 8 French all purpose drain
catheter into the {right lower quadrant abscess} with aspiration of
100 mL of purulent fluid. Samples were sent to the laboratory as
requested by the ordering clinical team.

## 2016-03-18 ENCOUNTER — Other Ambulatory Visit: Payer: Self-pay | Admitting: Cardiovascular Disease

## 2016-04-19 ENCOUNTER — Telehealth: Payer: Self-pay | Admitting: Cardiovascular Disease

## 2016-04-19 NOTE — Telephone Encounter (Signed)
Notified Tina at [facility] that I have faxed all requested information except cardiac clearance as Dr. Kirke Corin is not in [place] today. Pt has appt at [facility] tomorrow at 3pm.

## 2016-04-19 NOTE — Telephone Encounter (Signed)
Received Pre-Operative Assessment for Surgery from [facility]. Surgery date June 12, pre-op date June 1. Faxed last OV notes and EKG as requested to [facility], [phone]. Clearance placed in MD basket for review and signature.

## 2016-04-20 NOTE — Telephone Encounter (Signed)
Faxed cardiac clearance to [place] at [facility], [phone]

## 2016-04-21 ENCOUNTER — Telehealth: Payer: Self-pay | Admitting: Cardiovascular Disease

## 2016-04-21 NOTE — Telephone Encounter (Signed)
S/w Vincent [name] and reviewed Dr. Jari Sportsman instructions on cardiac clearance form regarding aspirin. Vincent [name] verbalized understanding with no further questions.

## 2016-04-21 NOTE — Telephone Encounter (Signed)
Pt is having back surgery on 6/12 . Please advise when he should stop his aspirin.

## 2016-10-10 ENCOUNTER — Ambulatory Visit (INDEPENDENT_AMBULATORY_CARE_PROVIDER_SITE_OTHER): Payer: Medicare Other | Admitting: Cardiovascular Disease

## 2016-10-10 ENCOUNTER — Encounter: Payer: Self-pay | Admitting: Cardiovascular Disease

## 2016-10-10 VITALS — BP 110/62 | HR 61 | Ht 70.0 in | Wt 222.2 lb

## 2016-10-10 DIAGNOSIS — I493 Ventricular premature depolarization: Secondary | ICD-10-CM

## 2016-10-10 DIAGNOSIS — I480 Paroxysmal atrial fibrillation: Secondary | ICD-10-CM

## 2016-10-10 DIAGNOSIS — I5022 Chronic systolic (congestive) heart failure: Secondary | ICD-10-CM | POA: Diagnosis not present

## 2016-10-10 NOTE — Patient Instructions (Signed)
Medication Instructions: Continue same medications.   Labwork: None.   Procedures/Testing: None.   Follow-Up: 1 year with Dr. Arida.   Any Additional Special Instructions Will Be Listed Below (If Applicable).     If you need a refill on your cardiac medications before your next appointment, please call your pharmacy.   

## 2016-10-10 NOTE — Progress Notes (Signed)
Cardiology Office Note   Date:  10/10/2016   ID:  Thomes, Corpe 12-20-1940, MRN [id number]  PCP:  [name], MD  Cardiologist:   [name], MD   Chief Complaint  Patient presents with  . other    12 month follow up. Meds reviewed by the pt. verbally. "doing well."      History of Present Illness: Vincent [name] is a 74 y.o. male who presents for a follow up regarding supraventricular tachycardia, isolated episode of postoperative atrial fibrillation and ventricular bigeminy. He also has a history of cardiomyopathy with a previous ejection fraction of 35-40%. Most recently it was 50-55% on echocardiogram in May of 2015. Catheterization from 2009 showed an ejection fraction of 40-45% range with a mild nonobstructive 20% plaque in his LAD.  PVCs have been treated with metoprolol and magnesium. Most recent nuclear stress test in 2013 was normal. He had postoperative atrial fibrillation in December 2015 after a ruptured appendix. He was treated with amiodarone and converted to sinus rhythm. Amiodarone was subsequently discontinued after he developed abnormal thyroid function. He has been doing extremely well with no recurrent arrhythmia. He denies any chest pain or shortness of breath. He underwent back surgery this summer but reports no significant improvement in back pain.  Past Medical History:  Diagnosis Date  . Dizziness and giddiness   . History of NICM    a. Prev EF as low as 35-40%;  b. 03/2012 Echo: EF 50-55%, Gr 1 DD, mild MR;  c. 06/2014 Echo: EF 50-55%, gr 1 DD, mild MR, mildly dil LA.  [name] History of systolic CHF    a. Prev EF as low as 35-40%;  b. 03/2012 Echo: EF 50-55%, Gr 1 DD, mild MR;  c. 06/2014 Echo: EF 50-55%, gr 1 DD, mild MR, mildly dil LA.  . Non-obstructive CAD    a. 2009 nonobs dzs, LAD 20, EF 40-45%;  b. 07/2012 Ex MV: EF 55%, freq pvc's, no ischemia/infarct.  [name] PAF (paroxysmal atrial fibrillation) (HCC)    a. 11/2013 - occurred 3 wks  post-op Appe in setting of pelvic abscess-->amio-->converted but amio later d/c'd 2/2 hypothyroidism.  . Paroxysmal supraventricular tachycardia (HCC)    a. SVT/PAT  . PSVT (paroxysmal supraventricular tachycardia) (HCC)    a. 11/2013.  [name] PVC's (premature ventricular contractions)    a. Asymptomatic - managed with Toprol and Mg.    Past Surgical History:  Procedure Laterality Date  . ABSCESS DRAINAGE    . APPENDECTOMY    . HAND SURGERY  1999  . TOTAL KNEE ARTHROPLASTY       Current Outpatient Prescriptions  Medication Sig Dispense Refill  . aspirin 81 MG EC tablet Take 81 mg by mouth daily.      . diclofenac (VOLTAREN) 75 MG EC tablet TAKE 1 TABLET(S) TWICE A DAY BY ORAL ROUTE.    [name] dutasteride (AVODART) 0.5 MG capsule Take 0.5 mg by mouth every other day.    . gabapentin (NEURONTIN) 300 MG capsule Take 300 mg by mouth 4 (four) times daily.    [name] levothyroxine (SYNTHROID, LEVOTHROID) 25 MCG tablet Take 25 mcg by mouth daily before breakfast.    . Magnesium Oxide 400 (240 MG) MG TABS Take 1 tablet by mouth 2 (two) times daily. 60 tablet 6  . metoprolol succinate (TOPROL-XL) 25 MG 24 hr tablet TAKE 1 TABLET DAILY 90 tablet 3  . Multiple Vitamin (MULTIVITAMIN) tablet Take 1 tablet by mouth daily.      [name]  omeprazole (PRILOSEC) 20 MG capsule Take 20 mg by mouth daily.       No current facility-administered medications for this visit.     Allergies:   Patient has no known allergies.    Social History:  The patient  reports that he has never smoked. He has never used smokeless tobacco. He reports that he does not drink alcohol or use drugs.   Family History:  The patient's family history includes Heart attack (age of onset: 3276) in his father.    ROS:  Please see the history of present illness.   Otherwise, review of systems are positive for none.   All other systems are reviewed and negative.    PHYSICAL EXAM: VS:  BP 110/62 (BP Location: Left Arm, Patient Position: Sitting, Cuff  Size: Normal)   Pulse 61   Ht 5\' 10"  (1.778 m)   Wt 222 lb 4 oz (100.8 kg)   BMI 31.89 kg/m  , BMI Body mass index is 31.89 kg/m. GEN: Well nourished, well developed, in no acute distress  HEENT: normal  Neck: no JVD, carotid bruits, or masses Cardiac: RRR; no murmurs, rubs, or gallops,no edema  Respiratory:  clear to auscultation bilaterally, normal work of breathing GI: soft, nontender, nondistended, + BS MS: no deformity or atrophy  Skin: warm and dry, no rash Neuro:  Strength and sensation are intact Psych: euthymic mood, full affect   EKG:  EKG is ordered today. The ekg ordered today demonstrates normal sinus rhythm with nonspecific ST changes.   Recent Labs: No results found for requested labs within last 8760 hours.    Lipid Panel    Component Value Date/Time   CHOL 218 10/25/2009   TRIG 189 10/25/2009   HDL 37.3 10/25/2009   LDLCALC 142.9 10/25/2009      Wt Readings from Last 3 Encounters:  10/10/16 222 lb 4 oz (100.8 kg)  10/05/15 222 lb 4 oz (100.8 kg)  02/25/15 220 lb (99.8 kg)      No flowsheet data found.    ASSESSMENT AND PLAN:  1.  Paroxysmal supraventricular tachycardia: No evidence of recurrent arrhythmia.  2. Frequent PVCs: Well controlled with metoprolol and magnesium.  3. History of nonischemic cardiomyopathy: Most recent ejection fraction was low normal in 2015.  4. One isolated episode of postoperative atrial fibrillation with no evidence of recurrent arrhythmia. No anticoagulation is recommended unless he has recurrent episodes.    Disposition:   FU with me in 1 year  Signed,  Lorine BearsMuhammad Franke Menter, MD  10/10/2016 2:04 PM    [facility]

## 2017-03-10 ENCOUNTER — Other Ambulatory Visit: Payer: Self-pay | Admitting: Cardiovascular Disease

## 2017-05-08 ENCOUNTER — Telehealth: Payer: Self-pay | Admitting: *Deleted

## 2017-05-08 NOTE — Telephone Encounter (Signed)
Clearance received from [facility] for Left partial knee replacement at [facility] on 05/18/17 by Dr Cassell Smiles.  Type of Anesthesia: choice. Clearance form placed in physician's box for review.

## 2017-05-17 NOTE — Telephone Encounter (Signed)
Pt came to the office inquiring of cardiac clearance for 6/29 left partial knee replacement with Dr. Cassell Smiles.  Clearance signed by Dr. Kirke Corin. S/w Lurena Joiner at [employer], [phone] who asks I fax it to [phone]. Clearance faxed.

## 2017-05-17 NOTE — Telephone Encounter (Signed)
Pt calling asking if we could please have the forms done He is to have surgery in the morning  Please advise

## 2017-06-17 HISTORY — PX: TOTAL KNEE ARTHROPLASTY: SHX125

## 2017-08-13 ENCOUNTER — Other Ambulatory Visit: Payer: Self-pay | Admitting: Ophthalmology

## 2017-08-13 DIAGNOSIS — G453 Amaurosis fugax: Secondary | ICD-10-CM

## 2017-08-15 ENCOUNTER — Ambulatory Visit
Admission: RE | Admit: 2017-08-15 | Discharge: 2017-08-15 | Disposition: A | Discharge status: Home / Self Care | Payer: Medicare Other | Source: Ambulatory Visit | Attending: Ophthalmology | Admitting: Ophthalmology

## 2017-08-15 DIAGNOSIS — G453 Amaurosis fugax: Secondary | ICD-10-CM | POA: Diagnosis present

## 2017-10-03 ENCOUNTER — Ambulatory Visit (INDEPENDENT_AMBULATORY_CARE_PROVIDER_SITE_OTHER): Payer: Medicare Other | Admitting: Urology

## 2017-10-03 ENCOUNTER — Encounter: Payer: Self-pay | Admitting: Urology

## 2017-10-03 VITALS — BP 116/74 | HR 110 | Ht 70.0 in | Wt 211.0 lb

## 2017-10-03 DIAGNOSIS — R350 Frequency of micturition: Secondary | ICD-10-CM | POA: Diagnosis not present

## 2017-10-03 DIAGNOSIS — R972 Elevated prostate specific antigen [PSA]: Secondary | ICD-10-CM | POA: Diagnosis not present

## 2017-10-03 DIAGNOSIS — N401 Enlarged prostate with lower urinary tract symptoms: Secondary | ICD-10-CM | POA: Diagnosis not present

## 2017-10-03 MED ORDER — TAMSULOSIN HCL 0.4 MG PO CAPS
0.4000 mg | ORAL_CAPSULE | Freq: Every day | ORAL | 0 refills | Status: DC
Start: 2017-10-03 — End: 2017-11-15

## 2017-10-03 NOTE — Progress Notes (Signed)
10/03/2017 1:50 PM   Vincent [name] Vincent [name] [birth year] [id number]  Referring provider: [name], MD [address] [name] [name], [place] [address]  Chief Complaint  Patient presents with  . Benign Prostatic Hypertrophy    1year    HPI: 76 year old male presents for annual follow-up of BPH and an elevated PSA. Prostate biopsy was performed in early 2000 for a PSA of 6.1 with benign pathology.  Repeat biopsy performed in 2006 for a rise in his PSA to 6.6 which was again benign.  He had increased prostate volume and is currently taking Avodart every other day.  His uncorrected PSA has been stable in the low-mid 2 range.  His last PSA was performed by his PCP in September 2018 and was stable at 2.53.  Since his visit last year he has noted some increased urinary frequency, urgency with occasional episodes of urge incontinence.   PMH: Past Medical History:  Diagnosis Date  . Dizziness and giddiness   . History of NICM    a. Prev EF as low as 35-40%;  b. 03/2012 Echo: EF 50-55%, Gr 1 DD, mild MR;  c. 06/2014 Echo: EF 50-55%, gr 1 DD, mild MR, mildly dil LA.  [name] History of systolic CHF    a. Prev EF as low as 35-40%;  b. 03/2012 Echo: EF 50-55%, Gr 1 DD, mild MR;  c. 06/2014 Echo: EF 50-55%, gr 1 DD, mild MR, mildly dil LA.  . Non-obstructive CAD    a. 2009 nonobs dzs, LAD 20, EF 40-45%;  b. 07/2012 Ex MV: EF 55%, freq pvc's, no ischemia/infarct.  [name] PAF (paroxysmal atrial fibrillation) (HCC)    a. 11/2013 - occurred 3 wks post-op Appe in setting of pelvic abscess-->amio-->converted but amio later d/c'd 2/2 hypothyroidism.  . Paroxysmal supraventricular tachycardia (HCC)    a. SVT/PAT  . PSVT (paroxysmal supraventricular tachycardia) (HCC)    a. 11/2013.  [name] PVC's (premature ventricular contractions)    a. Asymptomatic - managed with Toprol and Mg.    Surgical History: Past Surgical History:  Procedure Laterality Date  . ABSCESS DRAINAGE    . APPENDECTOMY    .  HAND SURGERY  1999  . TOTAL KNEE ARTHROPLASTY      Home Medications:  Allergies as of 10/03/2017   No Known Allergies     Medication List        Accurate as of 10/03/17  1:50 PM. Always use your most recent med list.          aspirin 81 MG EC tablet Take 81 mg by mouth daily.   diclofenac 75 MG EC tablet Commonly known as:  VOLTAREN TAKE 1 TABLET(S) TWICE A DAY BY ORAL ROUTE.   dutasteride 0.5 MG capsule Commonly known as:  AVODART Take 0.5 mg by mouth every other day.   FISH OIL PO Take by mouth.   gabapentin 300 MG capsule Commonly known as:  NEURONTIN Take 300 mg by mouth 4 (four) times daily.   levothyroxine 25 MCG tablet Commonly known as:  SYNTHROID, LEVOTHROID Take 25 mcg by mouth daily before breakfast.   Magnesium Oxide 400 (240 Mg) MG Tabs Take 1 tablet by mouth 2 (two) times daily.   metoprolol succinate 25 MG 24 hr tablet Commonly known as:  TOPROL-XL TAKE 1 TABLET DAILY   multivitamin tablet Take 1 tablet by mouth daily.   omeprazole 20 MG capsule Commonly known as:  PRILOSEC Take 20 mg by mouth daily.  tamsulosin 0.4 MG Caps capsule Commonly known as:  FLOMAX Take 1 capsule (0.4 mg total) daily by mouth.       Allergies: No Known Allergies  Family History: Family History  Problem Relation Age of Onset  . Heart attack Father 2676  . Coronary artery disease Unknown        family hx  . Heart failure Unknown        family hx - CHF    Social History:  reports that  has never smoked. he has never used smokeless tobacco. He reports that he does not drink alcohol or use drugs.  ROS: UROLOGY Frequent Urination?: Yes Hard to postpone urination?: Yes Burning/pain with urination?: No Get up at night to urinate?: No Leakage of urine?: No Urine stream starts and stops?: No Trouble starting stream?: No Do you have to strain to urinate?: No Blood in urine?: No Urinary tract infection?: No Sexually transmitted disease?: No Injury to  kidneys or bladder?: No Painful intercourse?: No Weak stream?: No Erection problems?: No Penile pain?: No  Gastrointestinal Nausea?: No Vomiting?: No Indigestion/heartburn?: No Diarrhea?: No Constipation?: No  Constitutional Fever: No Night sweats?: No Weight loss?: No Fatigue?: No  Skin Skin rash/lesions?: No Itching?: No  Eyes Blurred vision?: No Double vision?: No  Ears/Nose/Throat Sore throat?: No Sinus problems?: No  Hematologic/Lymphatic Swollen glands?: No Easy bruising?: No  Cardiovascular Leg swelling?: No Chest pain?: No  Respiratory Cough?: No Shortness of breath?: No  Endocrine Excessive thirst?: No  Musculoskeletal Back pain?: No Joint pain?: No  Neurological Headaches?: No Dizziness?: No  Psychologic Depression?: No Anxiety?: No  Physical Exam: BP 116/74   Pulse (!) 110   Ht 5\' 10"  (1.778 m)   Wt 211 lb (95.7 kg)   BMI 30.28 kg/m   Constitutional:  Alert and oriented, No acute distress. HEENT: [place] AT, moist mucus membranes.  Trachea midline, no masses. Cardiovascular: No clubbing, cyanosis, or edema. Respiratory: Normal respiratory effort, no increased work of breathing. GI: Abdomen is soft, nontender, nondistended, no abdominal masses GU: No CVA tenderness.  Prostate 40 g, smooth without nodules Skin: No rashes, bruises or suspicious lesions. Lymph: No cervical or inguinal adenopathy. Neurologic: Grossly intact, no focal deficits, moving all 4 extremities. Psychiatric: Normal mood and affect.  Laboratory Data: Lab Results  Component Value Date   WBC 8.7 11/25/2014   HGB 10.4 (L) 11/25/2014   HCT 30.9 (L) 11/25/2014   MCV 88 11/25/2014   PLT 251 11/25/2014    Lab Results  Component Value Date   CREATININE 1.06 11/26/2014     Assessment & Plan:    1. Benign prostatic hyperplasia with urinary frequency He has noted some increased frequency and urgency.  He is interested in additional medical management and will  give a trial of tamsulosin.  He will call back in 1 month regarding efficacy.  Continue Avodart  2. Elevated prostate specific antigen (PSA) Stable PSA/benign DRE.  Continue annual follow-up.   Return in about 1 year (around 10/03/2018) for Recheck.  Riki AltesScott C Ladesha Pacini, MD  [facility] [address], [address] [place], [place] [address] [phone]

## 2017-10-04 DIAGNOSIS — M720 Palmar fascial fibromatosis [Dupuytren]: Secondary | ICD-10-CM | POA: Insufficient documentation

## 2017-10-05 ENCOUNTER — Encounter: Payer: Self-pay | Admitting: Urology

## 2017-10-05 ENCOUNTER — Telehealth: Payer: Self-pay | Admitting: Cardiovascular Disease

## 2017-10-05 NOTE — Telephone Encounter (Signed)
Clearance request for dupuytren's contracture release with [facility] on Jasmine December, Charity fundraiser, desk. Pt has 11/27 OV scheduled with Dr. Kirke Corin.

## 2017-10-08 ENCOUNTER — Encounter: Payer: Self-pay | Admitting: *Deleted

## 2017-10-09 NOTE — Discharge Instructions (Signed)

## 2017-10-16 ENCOUNTER — Encounter: Payer: Self-pay | Admitting: Cardiovascular Disease

## 2017-10-16 ENCOUNTER — Ambulatory Visit (INDEPENDENT_AMBULATORY_CARE_PROVIDER_SITE_OTHER): Payer: Medicare Other | Admitting: Cardiovascular Disease

## 2017-10-16 VITALS — BP 100/70 | HR 64 | Ht 70.5 in | Wt 216.0 lb

## 2017-10-16 DIAGNOSIS — I493 Ventricular premature depolarization: Secondary | ICD-10-CM | POA: Diagnosis not present

## 2017-10-16 DIAGNOSIS — I5022 Chronic systolic (congestive) heart failure: Secondary | ICD-10-CM | POA: Diagnosis not present

## 2017-10-16 DIAGNOSIS — I480 Paroxysmal atrial fibrillation: Secondary | ICD-10-CM

## 2017-10-16 MED ORDER — RIVAROXABAN 20 MG PO TABS: 20.0000 mg | ORAL_TABLET | Freq: Every day | ORAL | 3 refills | Status: DC

## 2017-10-16 NOTE — Telephone Encounter (Signed)
Faxed clearance to [facility], [phone]

## 2017-10-16 NOTE — Progress Notes (Signed)
Cardiology Office Note   Date:  10/16/2017   ID:  Vincent [name], DOB Jul 06, 1941, MRN [id number]  PCP:  Barbette ReichmannHande, Vishwanath, MD  Cardiologist:   Lorine BearsMuhammad Leonel Mccollum, MD   Chief Complaint  Patient presents with  . other    12 month f/u c/o afib needs cardiac clearance. Meds reviewed verbally with pt.      History of Present Illness: Vincent [name] is a 76 y.o. male who presents for a follow up regarding supraventricular tachycardia, mild cardiomyopathy, paroxysmal atrial fibrillation and ventricular bigeminy.  Most recently it was 50-55% on echocardiogram in May of 2015. Catheterization from 2009 showed an ejection fraction of 40-45% range with a mild nonobstructive 20% plaque in his LAD.  PVCs have been treated with metoprolol and magnesium. Most recent nuclear stress test in 2013 was normal. He had postoperative atrial fibrillation in December 2015 after a ruptured appendix.  The plan was not to start anticoagulation unless he develops recurrent atrial fibrillation. He has been doing well overall with no chest pain or shortness of breath.  However, he has noted worsening palpitations over the last 6 months.  He had a recent routine office visit with his urologist and he was noted to be tachycardic at that time with heart rate ranging from 110 bpm to more than 150 bpm.  The episode did not last for long.  He is currently in sinus rhythm.   Past Medical History:  Diagnosis Date  . Dizziness and giddiness   . GERD (gastroesophageal reflux disease)   . History of NICM    a. Prev EF as low as 35-40%;  b. 03/2012 Echo: EF 50-55%, Gr 1 DD, mild MR;  c. 06/2014 Echo: EF 50-55%, gr 1 DD, mild MR, mildly dil LA.  [name]. History of systolic CHF    a. Prev EF as low as 35-40%;  b. 03/2012 Echo: EF 50-55%, Gr 1 DD, mild MR;  c. 06/2014 Echo: EF 50-55%, gr 1 DD, mild MR, mildly dil LA.  . Non-obstructive CAD    a. 2009 nonobs dzs, LAD 20, EF 40-45%;  b. 07/2012 Ex MV: EF 55%, freq pvc's, no  ischemia/infarct.  [name]. PAF (paroxysmal atrial fibrillation) (HCC)    a. 11/2013 - occurred 3 wks post-op Appe in setting of pelvic abscess-->amio-->converted but amio later d/c'd 2/2 hypothyroidism.  . Paroxysmal supraventricular tachycardia (HCC)    a. SVT/PAT  . PSVT (paroxysmal supraventricular tachycardia) (HCC)    a. 11/2013.  [name]. PVC's (premature ventricular contractions)    a. Asymptomatic - managed with Toprol and Mg.  . Sciatica of right side     Past Surgical History:  Procedure Laterality Date  . ABSCESS DRAINAGE    . APPENDECTOMY  2015  . CARDIAC CATHETERIZATION  2009  . HAND SURGERY  1999  . TOTAL KNEE ARTHROPLASTY Right 06/17/2017     Current Outpatient Medications  Medication Sig Dispense Refill  . amoxicillin (AMOXIL) 500 MG tablet Take 2,000 mg as needed by mouth (before dental procedures).    [name]. aspirin 81 MG EC tablet Take 81 mg by mouth daily.      . diclofenac (VOLTAREN) 75 MG EC tablet TAKE 1 TABLET(S) TWICE A DAY BY ORAL ROUTE.    [name]. dutasteride (AVODART) 0.5 MG capsule Take 0.5 mg by mouth every other day.    . gabapentin (NEURONTIN) 300 MG capsule Take 300 mg by mouth 4 (four) times daily.    [name]. levothyroxine (SYNTHROID, LEVOTHROID) 25 MCG tablet Take  25 mcg by mouth daily before breakfast.    . Magnesium Oxide 400 (240 MG) MG TABS Take 1 tablet by mouth 2 (two) times daily. 60 tablet 6  . metoprolol succinate (TOPROL-XL) 25 MG 24 hr tablet TAKE 1 TABLET DAILY 90 tablet 3  . Multiple Vitamin (MULTIVITAMIN) tablet Take 1 tablet by mouth daily.      . Omega-3 Fatty Acids (FISH OIL PO) Take by mouth.    [name] omeprazole (PRILOSEC) 20 MG capsule Take 20 mg by mouth daily.      . tamsulosin (FLOMAX) 0.4 MG CAPS capsule Take 1 capsule (0.4 mg total) daily by mouth. 30 capsule 0   No current facility-administered medications for this visit.     Allergies:   Patient has no known allergies.    Social History:  The patient  reports that  has never smoked. he has never  used smokeless tobacco. He reports that he does not drink alcohol or use drugs.   Family History:  The patient's family history includes Coronary artery disease in his unknown relative; Heart attack (age of onset: 96) in his father; Heart failure in his unknown relative.    ROS:  Please see the history of present illness.   Otherwise, review of systems are positive for none.   All other systems are reviewed and negative.    PHYSICAL EXAM: VS:  BP 100/70 (BP Location: Left Arm, Patient Position: Sitting, Cuff Size: Normal)   Pulse 64   Ht 5' 10.5" (1.791 m)   Wt 216 lb (98 kg)   BMI 30.55 kg/m  , BMI Body mass index is 30.55 kg/m. GEN: Well nourished, well developed, in no acute distress  HEENT: normal  Neck: no JVD, carotid bruits, or masses Cardiac: RRR; no murmurs, rubs, or gallops,no edema  Respiratory:  clear to auscultation bilaterally, normal work of breathing GI: soft, nontender, nondistended, + BS MS: no deformity or atrophy  Skin: warm and dry, no rash Neuro:  Strength and sensation are intact Psych: euthymic mood, full affect   EKG:  EKG is ordered today. The ekg ordered today demonstrates normal sinus rhythm with nonspecific ST changes.   Recent Labs: No results found for requested labs within last 8760 hours.    Lipid Panel    Component Value Date/Time   CHOL 218 10/25/2009   TRIG 189 10/25/2009   HDL 37.3 10/25/2009   LDLCALC 142.9 10/25/2009      Wt Readings from Last 3 Encounters:  10/16/17 216 lb (98 kg)  10/03/17 211 lb (95.7 kg)  10/10/16 222 lb 4 oz (100.8 kg)      No flowsheet data found.    ASSESSMENT AND PLAN:  1.  Paroxysmal atrial fibrillation: It appears that the patient is having more episodes of atrial fibrillation and thus anticoagulation is warranted given that his CHADS VASc score is at least 2.  I reviewed his labs in September which showed normal CBC and renal function.  I elected to start him on Xarelto 20 mg once daily and  stop aspirin.  This change will take effect in 2 weeks.  He is having cataract surgery tomorrow. Check CBC and basic metabolic profile in 1 month.  I discussed side effects with anticoagulation.  2.  Paroxysmal supraventricular tachycardia: No evidence of recurrent episodes.  3. Frequent PVCs: Well controlled with metoprolol and magnesium.  4. History of nonischemic cardiomyopathy: Most recent ejection fraction was low normal in 2015.  I requested a follow-up echocardiogram given increased  burden of atrial fibrillation.  5.  Preoperative cardiovascular evaluation for hand surgery: The patient is at low risk overall from a cardiac standpoint given lack of symptoms and good functional capacity.     Disposition:   FU with me in 6 months  Signed,  [name], MD  10/16/2017 1:58 PM    [facility]

## 2017-10-16 NOTE — Patient Instructions (Addendum)
Medication Instructions:  Your physician has recommended you make the following change in your medication:  In two weeks, STOP taking aspirin and START taking xarelto 20mg  once daily with supper. Hold xarelto two days before your surgery   Labwork: BMET and CBC in 2 months at the [facility]. No appt needed  Testing/Procedures: Your physician has requested that you have an echocardiogram. Echocardiography is a painless test that uses sound waves to create images of your heart. It provides your doctor with information about the size and shape of your heart and how well your heart's chambers and valves are working. This procedure takes approximately one hour. There are no restrictions for this procedure.    Follow-Up: Your physician wants you to follow-up in: 6 months with Dr. Kirke Corin.  You will receive a reminder letter in the mail two months in advance. If you don't receive a letter, please call our office to schedule the follow-up appointment.    Any Other Special Instructions Will Be Listed Below (If Applicable).     If you need a refill on your cardiac medications before your next appointment, please call your pharmacy.  Echocardiogram An echocardiogram, or echocardiography, uses sound waves (ultrasound) to produce an image of your heart. The echocardiogram is simple, painless, obtained within a short period of time, and offers valuable information to your health care provider. The images from an echocardiogram can provide information such as:  Evidence of coronary artery disease (CAD).  Heart size.  Heart muscle function.  Heart valve function.  Aneurysm detection.  Evidence of a past heart attack.  Fluid buildup around the heart.  Heart muscle thickening.  Assess heart valve function.  Tell a health care provider about:  Any allergies you have.  All medicines you are taking, including vitamins, herbs, eye drops, creams, and over-the-counter  medicines.  Any problems you or family members have had with anesthetic medicines.  Any blood disorders you have.  Any surgeries you have had.  Any medical conditions you have.  Whether you are pregnant or may be pregnant. What happens before the procedure? No special preparation is needed. Eat and drink normally. What happens during the procedure?  In order to produce an image of your heart, gel will be applied to your chest and a wand-like tool (transducer) will be moved over your chest. The gel will help transmit the sound waves from the transducer. The sound waves will harmlessly bounce off your heart to allow the heart images to be captured in real-time motion. These images will then be recorded.  You may need an IV to receive a medicine that improves the quality of the pictures. What happens after the procedure? You may return to your normal schedule including diet, activities, and medicines, unless your health care provider tells you otherwise. This information is not intended to replace advice given to you by your health care provider. Make sure you discuss any questions you have with your health care provider. Document Released: 11/03/2000 Document Revised: 06/24/2016 Document Reviewed: 07/14/2013 Elsevier Interactive Patient Education  2017 [employer].

## 2017-10-17 ENCOUNTER — Ambulatory Visit: Payer: Medicare Other | Admitting: Anesthesiology

## 2017-10-17 ENCOUNTER — Ambulatory Visit
Admission: RE | Admit: 2017-10-17 | Discharge: 2017-10-17 | Disposition: A | Discharge status: Home / Self Care | Payer: Medicare Other | Source: Ambulatory Visit | Attending: Ophthalmology | Admitting: Ophthalmology

## 2017-10-17 ENCOUNTER — Encounter
Admission: RE | Disposition: A | Discharge status: Home / Self Care | Payer: Self-pay | Source: Ambulatory Visit | Attending: Ophthalmology

## 2017-10-17 ENCOUNTER — Telehealth: Payer: Self-pay | Admitting: *Deleted

## 2017-10-17 DIAGNOSIS — I2510 Atherosclerotic heart disease of native coronary artery without angina pectoris: Secondary | ICD-10-CM | POA: Insufficient documentation

## 2017-10-17 DIAGNOSIS — E7800 Pure hypercholesterolemia, unspecified: Secondary | ICD-10-CM | POA: Diagnosis not present

## 2017-10-17 DIAGNOSIS — H2511 Age-related nuclear cataract, right eye: Secondary | ICD-10-CM | POA: Insufficient documentation

## 2017-10-17 DIAGNOSIS — M1990 Unspecified osteoarthritis, unspecified site: Secondary | ICD-10-CM | POA: Diagnosis not present

## 2017-10-17 DIAGNOSIS — I4891 Unspecified atrial fibrillation: Secondary | ICD-10-CM | POA: Insufficient documentation

## 2017-10-17 DIAGNOSIS — Z79899 Other long term (current) drug therapy: Secondary | ICD-10-CM | POA: Diagnosis not present

## 2017-10-17 DIAGNOSIS — K219 Gastro-esophageal reflux disease without esophagitis: Secondary | ICD-10-CM | POA: Insufficient documentation

## 2017-10-17 DIAGNOSIS — Z7982 Long term (current) use of aspirin: Secondary | ICD-10-CM | POA: Diagnosis not present

## 2017-10-17 HISTORY — PX: CATARACT EXTRACTION W/PHACO: SHX586

## 2017-10-17 HISTORY — DX: Gastro-esophageal reflux disease without esophagitis: K21.9

## 2017-10-17 HISTORY — DX: Sciatica, right side: M54.31

## 2017-10-17 SURGERY — PHACOEMULSIFICATION, CATARACT, WITH IOL INSERTION
Anesthesia: General | Laterality: Right | Wound class: Clean

## 2017-10-17 MED ORDER — CEFUROXIME OPHTHALMIC INJECTION 1 MG/0.1 ML
INJECTION | OPHTHALMIC | Status: DC | PRN
  Administered 2017-10-17: 0.1 mL via INTRACAMERAL

## 2017-10-17 MED ORDER — NA HYALUR & NA CHOND-NA HYALUR 0.4-0.35 ML IO KIT
PACK | INTRAOCULAR | Status: DC | PRN
  Administered 2017-10-17: 1 mL via INTRAOCULAR

## 2017-10-17 MED ORDER — ACETAMINOPHEN 325 MG PO TABS: 325.0000 mg | ORAL_TABLET | ORAL | Status: DC | PRN

## 2017-10-17 MED ORDER — ACETAMINOPHEN 160 MG/5ML PO SOLN: 325.0000 mg | ORAL | Status: DC | PRN

## 2017-10-17 MED ORDER — ONDANSETRON HCL 4 MG/2ML IJ SOLN: 4.0000 mg | Freq: Once | INTRAMUSCULAR | Status: DC | PRN

## 2017-10-17 MED ORDER — ARMC OPHTHALMIC DILATING DROPS
1.0000 "application " | OPHTHALMIC | Status: DC | PRN
  Administered 2017-10-17 (×2): 1 via OPHTHALMIC

## 2017-10-17 MED ORDER — BSS IO SOLN
INTRAOCULAR | Status: DC | PRN
  Administered 2017-10-17: 70 mL via OPHTHALMIC

## 2017-10-17 MED ORDER — LIDOCAINE HCL (PF) 2 % IJ SOLN
INTRAOCULAR | Status: DC | PRN
  Administered 2017-10-17: 1 mL via INTRAMUSCULAR

## 2017-10-17 MED ORDER — FENTANYL CITRATE (PF) 100 MCG/2ML IJ SOLN
INTRAMUSCULAR | Status: DC | PRN
  Administered 2017-10-17: 100 ug via INTRAVENOUS

## 2017-10-17 MED ORDER — BRIMONIDINE TARTRATE-TIMOLOL 0.2-0.5 % OP SOLN
OPHTHALMIC | Status: DC | PRN
  Administered 2017-10-17: 1 [drp] via OPHTHALMIC

## 2017-10-17 MED ORDER — LACTATED RINGERS IV SOLN: 10.0000 mL/h | INTRAVENOUS | Status: DC

## 2017-10-17 MED ORDER — MOXIFLOXACIN HCL 0.5 % OP SOLN
1.0000 [drp] | OPHTHALMIC | Status: DC | PRN
  Administered 2017-10-17 (×3): 1 [drp] via OPHTHALMIC

## 2017-10-17 MED ORDER — MIDAZOLAM HCL 2 MG/2ML IJ SOLN
INTRAMUSCULAR | Status: DC | PRN
  Administered 2017-10-17: 2 mg via INTRAVENOUS

## 2017-10-17 SURGICAL SUPPLY — 25 items
CANNULA ANT/CHMB 27GA (MISCELLANEOUS) ×2 IMPLANT
CARTRIDGE ABBOTT (MISCELLANEOUS) IMPLANT
GLOVE SURG LX 7.5 STRW (GLOVE) ×1
GLOVE SURG LX STRL 7.5 STRW (GLOVE) ×1 IMPLANT
GLOVE SURG TRIUMPH 8.0 PF LTX (GLOVE) ×2 IMPLANT
GOWN STRL REUS W/ TWL LRG LVL3 (GOWN DISPOSABLE) ×2 IMPLANT
GOWN STRL REUS W/TWL LRG LVL3 (GOWN DISPOSABLE) ×2
LENS IOL TECNIS ITEC 14.0 (Intraocular Lens) ×2 IMPLANT
MARKER SKIN DUAL TIP RULER LAB (MISCELLANEOUS) ×2 IMPLANT
NDL RETROBULBAR .5 NSTRL (NEEDLE) IMPLANT
NEEDLE FILTER BLUNT 18X 1/2SAF (NEEDLE) ×1
NEEDLE FILTER BLUNT 18X1 1/2 (NEEDLE) ×1 IMPLANT
PACK CATARACT BRASINGTON (MISCELLANEOUS) ×2 IMPLANT
PACK EYE AFTER SURG (MISCELLANEOUS) ×2 IMPLANT
PACK OPTHALMIC (MISCELLANEOUS) ×2 IMPLANT
RING MALYGIN 7.0 (MISCELLANEOUS) IMPLANT
SUT ETHILON 10-0 CS-B-6CS-B-6 (SUTURE)
SUT VICRYL  9 0 (SUTURE)
SUT VICRYL 9 0 (SUTURE) IMPLANT
SUTURE EHLN 10-0 CS-B-6CS-B-6 (SUTURE) IMPLANT
SYR 3ML LL SCALE MARK (SYRINGE) ×2 IMPLANT
SYR 5ML LL (SYRINGE) ×2 IMPLANT
SYR TB 1ML LUER SLIP (SYRINGE) ×2 IMPLANT
WATER STERILE IRR 250ML POUR (IV SOLUTION) ×2 IMPLANT
WIPE NON LINTING 3.25X3.25 (MISCELLANEOUS) ×2 IMPLANT

## 2017-10-17 NOTE — Op Note (Signed)
LOCATION:  [facility]   PREOPERATIVE DIAGNOSIS:    Nuclear sclerotic cataract right eye. H25.11   POSTOPERATIVE DIAGNOSIS:  Nuclear sclerotic cataract right eye.     PROCEDURE:  Phacoemusification with posterior chamber intraocular lens placement of the right eye   LENS:   Implant Name Type Inv. Item Serial No. Manufacturer Lot No. LRB No. Used  LENS IOL DIOP 14.0 - [id number] Intraocular Lens LENS IOL DIOP 14.0 [id number] AMO  Right 1        ULTRASOUND TIME: 16 % of 1 minutes, 15 seconds.  CDE 11.9   SURGEON:  Deirdre Evener, MD   ANESTHESIA:  Topical with tetracaine drops and 2% Xylocaine jelly, augmented with 1% preservative-free intracameral lidocaine.    COMPLICATIONS:  None.   DESCRIPTION OF PROCEDURE:  The patient was identified in the holding room and transported to the operating room and placed in the supine position under the operating microscope.  The right eye was identified as the operative eye and it was prepped and draped in the usual sterile ophthalmic fashion.   A 1 millimeter clear-corneal paracentesis was made at the 12:00 position.  0.5 ml of preservative-free 1% lidocaine was injected into the anterior chamber. The anterior chamber was filled with Viscoat viscoelastic.  A 2.4 millimeter keratome was used to make a near-clear corneal incision at the 9:00 position.  A curvilinear capsulorrhexis was made with a cystotome and capsulorrhexis forceps.  Balanced salt solution was used to hydrodissect and hydrodelineate the nucleus.   Phacoemulsification was then used in stop and chop fashion to remove the lens nucleus and epinucleus.  The remaining cortex was then removed using the irrigation and aspiration handpiece. Provisc was then placed into the capsular bag to distend it for lens placement.  A lens was then injected into the capsular bag.  The remaining viscoelastic was aspirated.   Wounds were hydrated with balanced salt solution.  The anterior  chamber was inflated to a physiologic pressure with balanced salt solution.  No wound leaks were noted. Cefuroxime 0.1 ml of a 10mg /ml solution was injected into the anterior chamber for a dose of 1 mg of intracameral antibiotic at the completion of the case.   Timolol and Brimonidine drops were applied to the eye.  The patient was taken to the recovery room in stable condition without complications of anesthesia or surgery.   Vincent [name] 10/17/2017, 11:32 AM

## 2017-10-17 NOTE — Anesthesia Procedure Notes (Signed)
Procedure Name: MAC Performed by: Carlton Buskey, CRNA Pre-anesthesia Checklist: Patient identified, Emergency Drugs available, Suction available, Timeout performed and Patient being monitored Patient Re-evaluated:Patient Re-evaluated prior to induction Oxygen Delivery Method: Nasal cannula Placement Confirmation: positive ETCO2       

## 2017-10-17 NOTE — Telephone Encounter (Signed)
Pt has been approved for Xarelto 20 mg tablet . 09/17/17-10/17/2018.

## 2017-10-17 NOTE — Anesthesia Preprocedure Evaluation (Signed)
Anesthesia Evaluation  Patient identified by MRN, date of birth, ID band Patient awake    Reviewed: Allergy & Precautions, H&P , NPO status , Patient's Chart, lab work & pertinent test results, reviewed documented beta blocker date and time   Airway Mallampati: II  TM Distance: >3 FB Neck ROM: full    Dental no notable dental hx.    Pulmonary neg pulmonary ROS,    Pulmonary exam normal breath sounds clear to auscultation       Cardiovascular Exercise Tolerance: Good + CAD  + dysrhythmias Atrial Fibrillation  Rhythm:regular Rate:Normal     Neuro/Psych negative neurological ROS  negative psych ROS   GI/Hepatic Neg liver ROS, GERD  ,  Endo/Other  negative endocrine ROS  Renal/GU negative Renal ROS  negative genitourinary   Musculoskeletal   Abdominal   Peds  Hematology negative hematology ROS (+)   Anesthesia Other Findings   Reproductive/Obstetrics negative OB ROS                             Anesthesia Physical Anesthesia Plan  ASA: III  Anesthesia Plan: General   Post-op Pain Management:    Induction:   PONV Risk Score and Plan:   Airway Management Planned:   Additional Equipment:   Intra-op Plan:   Post-operative Plan:   Informed Consent: I have reviewed the patients History and Physical, chart, labs and discussed the procedure including the risks, benefits and alternatives for the proposed anesthesia with the patient or authorized representative who has indicated his/her understanding and acceptance.   Dental Advisory Given  Plan Discussed with: CRNA  Anesthesia Plan Comments:         Anesthesia Quick Evaluation

## 2017-10-17 NOTE — Transfer of Care (Signed)
Immediate Anesthesia Transfer of Care Note  Patient: Vincent [name]  Procedure(s) Performed: CATARACT EXTRACTION PHACO AND INTRAOCULAR LENS PLACEMENT (IOC) RIGHT (Right )  Patient Location: PACU  Anesthesia Type: General  Level of Consciousness: awake, alert  and patient cooperative  Airway and Oxygen Therapy: Patient Spontanous Breathing and Patient connected to supplemental oxygen  Post-op Assessment: Post-op Vital signs reviewed, Patient's Cardiovascular Status Stable, Respiratory Function Stable, Patent Airway and No signs of Nausea or vomiting  Post-op Vital Signs: Reviewed and stable  Complications: No apparent anesthesia complications

## 2017-10-17 NOTE — Anesthesia Postprocedure Evaluation (Signed)
Anesthesia Post Note  Patient: Vincent [name]  Procedure(s) Performed: CATARACT EXTRACTION PHACO AND INTRAOCULAR LENS PLACEMENT (IOC) RIGHT (Right )  Patient location during evaluation: PACU Anesthesia Type: General Level of consciousness: awake and alert Pain management: pain level controlled Vital Signs Assessment: post-procedure vital signs reviewed and stable Respiratory status: spontaneous breathing, nonlabored ventilation, respiratory function stable and patient connected to nasal cannula oxygen Cardiovascular status: stable and blood pressure returned to baseline Postop Assessment: no apparent nausea or vomiting Anesthetic complications: no    Caster Fayette ELAINE

## 2017-10-17 NOTE — H&P (Signed)
The History and Physical notes are on paper, have been signed, and are to be scanned. The patient remains stable and unchanged from the H&P.   Previous H&P reviewed, patient examined, and there are no changes.  Vincent [name] 10/17/2017 10:04 AM

## 2017-10-19 ENCOUNTER — Encounter: Payer: Self-pay | Admitting: Ophthalmology

## 2017-10-24 ENCOUNTER — Other Ambulatory Visit: Payer: Self-pay

## 2017-10-24 ENCOUNTER — Ambulatory Visit (INDEPENDENT_AMBULATORY_CARE_PROVIDER_SITE_OTHER): Payer: Medicare Other

## 2017-10-24 DIAGNOSIS — I480 Paroxysmal atrial fibrillation: Secondary | ICD-10-CM

## 2017-11-01 ENCOUNTER — Other Ambulatory Visit: Payer: Self-pay | Admitting: *Deleted

## 2017-11-01 DIAGNOSIS — I480 Paroxysmal atrial fibrillation: Secondary | ICD-10-CM

## 2017-11-08 ENCOUNTER — Ambulatory Visit (INDEPENDENT_AMBULATORY_CARE_PROVIDER_SITE_OTHER): Payer: Medicare Other

## 2017-11-08 DIAGNOSIS — I480 Paroxysmal atrial fibrillation: Secondary | ICD-10-CM

## 2017-11-14 ENCOUNTER — Telehealth: Payer: Self-pay | Admitting: Urology

## 2017-11-14 NOTE — Telephone Encounter (Signed)
Pt needs a refill and they called on 12/17 and 12/18 with no response.  He needs a refill on Tamsulosin. Please give pt a call at [phone]  He is completely OUT of meds.

## 2017-11-15 MED ORDER — TAMSULOSIN HCL 0.4 MG PO CAPS: 0.4000 mg | ORAL_CAPSULE | Freq: Every day | ORAL | 0 refills | Status: DC

## 2017-11-15 NOTE — Telephone Encounter (Signed)
Spoke with pt in reference to tamsulosin. Pt stated that he is not real sure if the medication was actually helping therefore he was wanting another 30 day supply sent to local pharmacy before sending to mail order pharmacy. Pt stated that he would call back in 31mo. 30 tabs were sent to [facility] in [place].

## 2017-11-19 ENCOUNTER — Other Ambulatory Visit: Payer: Self-pay | Admitting: Specialist

## 2017-11-20 ENCOUNTER — Inpatient Hospital Stay: Admission: RE | Admit: 2017-11-20 | Payer: Medicare Other | Source: Ambulatory Visit

## 2017-11-22 ENCOUNTER — Other Ambulatory Visit: Payer: Self-pay

## 2017-11-22 ENCOUNTER — Emergency Department
Admission: EM | Admit: 2017-11-22 | Discharge: 2017-11-22 | Disposition: A | Discharge status: Home / Self Care | Payer: Medicare Other | Attending: Emergency Medicine | Admitting: Emergency Medicine

## 2017-11-22 ENCOUNTER — Telehealth: Payer: Self-pay | Admitting: Cardiovascular Disease

## 2017-11-22 ENCOUNTER — Inpatient Hospital Stay: Admission: RE | Admit: 2017-11-22 | Payer: Medicare Other | Source: Ambulatory Visit

## 2017-11-22 ENCOUNTER — Emergency Department: Payer: Medicare Other

## 2017-11-22 ENCOUNTER — Ambulatory Visit
Admission: RE | Admit: 2017-11-22 | Discharge: 2017-11-22 | Disposition: A | Discharge status: Home / Self Care | Payer: Medicare Other | Source: Ambulatory Visit | Attending: Cardiovascular Disease | Admitting: Cardiovascular Disease

## 2017-11-22 ENCOUNTER — Encounter
Admission: RE | Admit: 2017-11-22 | Discharge: 2017-11-22 | Disposition: A | Discharge status: Home / Self Care | Payer: Medicare Other | Source: Ambulatory Visit | Attending: Specialist | Admitting: Specialist

## 2017-11-22 DIAGNOSIS — E039 Hypothyroidism, unspecified: Secondary | ICD-10-CM | POA: Insufficient documentation

## 2017-11-22 DIAGNOSIS — I4891 Unspecified atrial fibrillation: Secondary | ICD-10-CM

## 2017-11-22 DIAGNOSIS — Z01812 Encounter for preprocedural laboratory examination: Secondary | ICD-10-CM | POA: Insufficient documentation

## 2017-11-22 DIAGNOSIS — Z96652 Presence of left artificial knee joint: Secondary | ICD-10-CM | POA: Insufficient documentation

## 2017-11-22 DIAGNOSIS — Z7901 Long term (current) use of anticoagulants: Secondary | ICD-10-CM | POA: Diagnosis not present

## 2017-11-22 DIAGNOSIS — Z01810 Encounter for preprocedural cardiovascular examination: Secondary | ICD-10-CM

## 2017-11-22 DIAGNOSIS — I471 Supraventricular tachycardia: Secondary | ICD-10-CM | POA: Diagnosis present

## 2017-11-22 DIAGNOSIS — R9431 Abnormal electrocardiogram [ECG] [EKG]: Secondary | ICD-10-CM | POA: Insufficient documentation

## 2017-11-22 DIAGNOSIS — I5022 Chronic systolic (congestive) heart failure: Secondary | ICD-10-CM | POA: Insufficient documentation

## 2017-11-22 DIAGNOSIS — Z79899 Other long term (current) drug therapy: Secondary | ICD-10-CM | POA: Insufficient documentation

## 2017-11-22 DIAGNOSIS — Z96651 Presence of right artificial knee joint: Secondary | ICD-10-CM | POA: Insufficient documentation

## 2017-11-22 HISTORY — DX: Cardiac arrhythmia, unspecified: I49.9

## 2017-11-22 HISTORY — DX: Unspecified osteoarthritis, unspecified site: M19.90

## 2017-11-22 HISTORY — DX: Hypothyroidism, unspecified: E03.9

## 2017-11-22 HISTORY — DX: Benign prostatic hyperplasia without lower urinary tract symptoms: N40.0

## 2017-11-22 LAB — CBC
HEMATOCRIT: 40.8 % (ref 40.0–52.0)
HEMATOCRIT: 43 % (ref 40.0–52.0)
HEMOGLOBIN: 14.4 g/dL (ref 13.0–18.0)
Hemoglobin: 13.7 g/dL (ref 13.0–18.0)
MCH: 30.7 pg (ref 26.0–34.0)
MCH: 30.7 pg (ref 26.0–34.0)
MCHC: 33.5 g/dL (ref 32.0–36.0)
MCHC: 33.4 g/dL (ref 32.0–36.0)
MCV: 91.6 fL (ref 80.0–100.0)
MCV: 92 fL (ref 80.0–100.0)
PLATELETS: 208 10*3/uL (ref 150–440)
Platelets: 207 10*3/uL (ref 150–440)
RBC: 4.45 MIL/uL (ref 4.40–5.90)
RBC: 4.68 MIL/uL (ref 4.40–5.90)
RDW: 14.3 % (ref 11.5–14.5)
RDW: 14.2 % (ref 11.5–14.5)
WBC: 5 10*3/uL (ref 3.8–10.6)
WBC: 5.3 10*3/uL (ref 3.8–10.6)

## 2017-11-22 LAB — BASIC METABOLIC PANEL
ANION GAP: 6 (ref 5–15)
ANION GAP: 7 (ref 5–15)
BUN: 20 mg/dL (ref 6–20)
BUN: 19 mg/dL (ref 6–20)
CALCIUM: 9.2 mg/dL (ref 8.9–10.3)
CALCIUM: 9.2 mg/dL (ref 8.9–10.3)
CO2: 27 mmol/L (ref 22–32)
CO2: 25 mmol/L (ref 22–32)
Chloride: 104 mmol/L (ref 101–111)
Chloride: 104 mmol/L (ref 101–111)
Creatinine, Ser: 0.99 mg/dL (ref 0.61–1.24)
Creatinine, Ser: 1.06 mg/dL (ref 0.61–1.24)
Glucose, Bld: 125 mg/dL — ABNORMAL HIGH (ref 65–99)
Glucose, Bld: 107 mg/dL — ABNORMAL HIGH (ref 65–99)
POTASSIUM: 4.3 mmol/L (ref 3.5–5.1)
POTASSIUM: 4.3 mmol/L (ref 3.5–5.1)
SODIUM: 136 mmol/L (ref 135–145)
Sodium: 137 mmol/L (ref 135–145)

## 2017-11-22 LAB — MAGNESIUM: Magnesium: 2 mg/dL (ref 1.7–2.4)

## 2017-11-22 LAB — T4, FREE: FREE T4: 1.32 ng/dL — AB (ref 0.61–1.12)

## 2017-11-22 LAB — TSH: TSH: 2.082 u[IU]/mL (ref 0.350–4.500)

## 2017-11-22 MED ORDER — MAGNESIUM SULFATE 2 GM/50ML IV SOLN
2.0000 g | Freq: Once | INTRAVENOUS | Status: AC
  Administered 2017-11-22: 2 g via INTRAVENOUS
  Filled 2017-11-22: qty 50

## 2017-11-22 MED ORDER — CHLORHEXIDINE GLUCONATE CLOTH 2 % EX PADS
6.0000 | MEDICATED_PAD | Freq: Once | CUTANEOUS | Status: DC
  Filled 2017-11-22: qty 6

## 2017-11-22 NOTE — Patient Instructions (Signed)
Your procedure is scheduled on: 11/28/17 Wed Report to Same Day Surgery 2nd floor medical mall [facility] Entrance-take elevator on left to 2nd floor.  Check in with surgery information desk.) To find out your arrival time please call [phone] between 1PM - 3PM on 11/27/17 Tues  Remember: Instructions that are not followed completely may result in serious medical risk, up to and including death, or upon the discretion of your surgeon and anesthesiologist your surgery may need to be rescheduled.    _x___ 1. Do not eat food after midnight the night before your procedure. You may drink clear liquids up to 2 hours before you are scheduled to arrive at the hospital for your procedure.  Do not drink clear liquids within 2 hours of your scheduled arrival to the hospital.  Clear liquids include  --Water or Apple juice without pulp  --Clear carbohydrate beverage such as ClearFast or Gatorade  --Black Coffee or Clear Tea (No milk, no creamers, do not add anything to                  the coffee or Tea Type 1 and type 2 diabetics should only drink water.  No gum chewing or hard candies.     __x__ 2. No Alcohol for 24 hours before or after surgery.   __x__3. No Smoking for 24 prior to surgery.   ____  4. Bring all medications with you on the day of surgery if instructed.    __x__ 5. Notify your doctor if there is any change in your medical condition     (cold, fever, infections).     Do not wear jewelry, make-up, hairpins, clips or nail polish.  Do not wear lotions, powders, or perfumes. You may wear deodorant.  Do not shave 48 hours prior to surgery. Men may shave face and neck.  Do not bring valuables to the hospital.    [facility] is not responsible for any belongings or valuables.               Contacts, dentures or bridgework may not be worn into surgery.  Leave your suitcase in the car. After surgery it may be brought to your room.  For patients admitted to the hospital, discharge  time is determined by your                       treatment team.   Patients discharged the day of surgery will not be allowed to drive home.  You will need someone to drive you home and stay with you the night of your procedure.    Please read over the following fact sheets that you were given:   [facility] Preparing for Surgery and or MRSA Information   _x___ Take anti-hypertensive listed below, cardiac, seizure, asthma,     anti-reflux and psychiatric medicines. These include:  1. gabapentin (NEURONTIN) 300 MG capsule  2.levothyroxine (SYNTHROID, LEVOTHROID) 25 MCG tablet  3.metoprolol succinate (TOPROL-XL) 25 MG 24 hr tablet take the night before surgery  4.omeprazole (PRILOSEC) 20 MG capsule  5.  6.  ____Fleets enema or Magnesium Citrate as directed.   _x___ Use CHG Soap or sage wipes as directed on instruction sheet   ____ Use inhalers on the day of surgery and bring to hospital day of surgery  ____ Stop Metformin and Janumet 2 days prior to surgery.    ____ Take 1/2 of usual insulin dose the night before surgery and none on the  morning     surgery.   _x___ Follow recommendations from Cardiologist, Pulmonologist or PCP regarding          stopping Aspirin, Coumadin, Plavix ,Eliquis, Effient, or Pradaxa, and Pletal. Per Dr Kirke Corin hold Xarelto 2 days before surgery.  X____Stop Anti-inflammatories such as Advil, Aleve, Ibuprofen, Motrin, Naproxen, Naprosyn, Goodies powders or aspirin products. OK to take Tylenol and                          Celebrex.   _x___ Stop supplements until after surgery.  But may continue Vitamin D, Vitamin B,       and multivitamin.   ____ Bring C-Pap to the hospital.

## 2017-11-22 NOTE — ED Triage Notes (Signed)
Pt presents from Dr. Eston Esters office after going for a pre-op appointment. They sent him over with an EKG stating he had SVT. Pt is alert & oriented with no sob or distress.

## 2017-11-22 NOTE — Pre-Procedure Instructions (Signed)
Called Dr Pernell Dupre regarding abnormal EKG (SVT). "Take patient to the ED for treatment and a cardiac clearance needed." per Dr Pernell Dupre

## 2017-11-22 NOTE — Telephone Encounter (Signed)
° °  [facility] Pre-operative Risk Assessment    Request for surgical clearance:  1. What type of surgery is being performed? Dupuytren Contracture Release   2. When is this surgery scheduled? 11/28/17  3. Are there any medications that need to be held prior to surgery and how long? Not Listed  4. Practice name and name of physician performing surgery? Dr Sabra Heck  5. What is your office phone and fax number? Pre Admit Testing phone [phone] Fax: [phone]  6. Anesthesia type (None, local, MAC, general) ? Not Listed   Placed in nurses bin _________________________________________________________________   (provider comments below)

## 2017-11-22 NOTE — Pre-Procedure Instructions (Signed)
CALLED DOWN TO THE ED AND SPOKE WITH STEPHANIE RN WHO IS CHARGE NURSE.  INFORMED HER THAT PT WAS IN PAT TO PREOP FOR UPCOMING SURGERY NEXT WEEK.  EKG WAS DONE AND PT IS IN SVT WITH A RATE OF 137. KAREN DICKSON RN SPOKE WITH DR ADAMS AND DR ADAMS WANTS PT TO BE TAKEN TO ED.  ALL THIS INFO WAS RELAYED TO STEPHANIE.  PT TAKEN TO ED VIA WHEELCHAIR.

## 2017-11-22 NOTE — ED Notes (Addendum)
Pt reports he was at the hospital for preop of surgery on left hand.  Pt had increased heart rate and was sent to er for eval.  No chest pain or sob. Iv in place  Lab sent.  Pt in sinus rhythm now.  Pt alert.  Skin warm and dry.  Pt on xarelto for 3 weeks.

## 2017-11-22 NOTE — Telephone Encounter (Signed)
Patient seen in the ER today after going through Pre-Admit testing- was in SVT- holter placed.  Reviewed with Ward Givens, NP for recommendations regarding clearance- per Thayer Ohm, forward to Dr. Kirke Corin to review pending results of his holter.

## 2017-11-22 NOTE — ED Provider Notes (Signed)
[facility] Emergency Department Provider Note  ____________________________________________  Time seen: Approximately 4:02 PM  I have reviewed the triage vital signs and the nursing notes.   HISTORY  Chief Complaint SVT   HPI Vincent [name] is a 77 y.o. 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 Patient was here for preop appointment when he was found to be tachycardic with an EKG showing SVT. Patient is scheduled to undergo surgery in his hand in a week. Patient denies palpitations, chest pain, dizziness, shortness of breath. He endorses compliance with his beta blocker and Xarelto and took both doses this am. Normal appetite, no recent illness, no fever, chills, nausea, vomiting, diarrhea or URI symptoms. Patient is completely asymptomatic from his SVT.  Chief Complaint: SVT Quality: with RVR Severity: moderate Duration: unknown  Context: found at preop appointment this am Modifying factors: nothing makes it better or worse Associated signs/symptoms: no CP, SOB, or palpitations    Past Medical History:  Diagnosis Date  . Arthritis   . Dizziness and giddiness   . Dysrhythmia   . GERD (gastroesophageal reflux disease)   . History of NICM    a. Prev EF as low as 35-40%;  b. 03/2012 Echo: EF 50-55%, Gr 1 DD, mild MR;  c. 06/2014 Echo: EF 50-55%, gr 1 DD, mild MR, mildly dil LA.  [name] History of systolic CHF    a. Prev EF as low as 35-40%;  b. 03/2012 Echo: EF 50-55%, Gr 1 DD, mild MR;  c. 06/2014 Echo: EF 50-55%, gr 1 DD, mild MR, mildly dil LA.  [name] Hypothyroidism   . Non-obstructive CAD    a. 2009 nonobs dzs, LAD 20, EF 40-45%;  b. 07/2012 Ex MV: EF 55%, freq pvc's, no ischemia/infarct.  [name] PAF (paroxysmal atrial fibrillation) (HCC)    a. 11/2013 - occurred 3 wks post-op Appe in setting of pelvic abscess-->amio-->converted  but amio later d/c'd 2/2 hypothyroidism.  . Paroxysmal supraventricular tachycardia (HCC)    a. SVT/PAT  . Prostate enlargement   . PSVT (paroxysmal supraventricular tachycardia) (HCC)    a. 11/2013.  [name] PVC's (premature ventricular contractions)    a. Asymptomatic - managed with Toprol and Mg.  . Sciatica of right side     Patient Active Problem List   Diagnosis Date Noted  . PAF (paroxysmal atrial fibrillation) (HCC)   . Postoperative atrial fibrillation (HCC) 12/10/2014  . Chronic systolic heart failure (HCC) 12/10/2014  . Palpitations 06/23/2014  . Urinary urgency 09/03/2012  . Hyperlipidemia 09/03/2012  . Enlarged prostate with lower urinary tract symptoms (LUTS) 09/03/2012  . Elevated prostate specific antigen (PSA) 09/03/2012  . Preop cardiovascular exam 08/15/2012  . Left arm weakness 07/25/2012  . 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 Arthritis of knee 03/20/2012  . Pain in joint involving lower leg 02/08/2012  . Joint effusion of the lower leg 02/08/2012  . Hypotension 08/10/2011  . PVC's (premature ventricular contractions) 08/10/2011  . CARDIOMYOPATHY, SECONDARY 05/13/2009  . SVT/ PSVT/ PAT 05/13/2009  . CONGESTIVE HEART FAILURE, UNSPECIFIED 05/13/2009  . DIZZINESS 05/13/2009    Past Surgical History:  Procedure Laterality Date  . ABSCESS DRAINAGE    . APPENDECTOMY  2015  . CARDIAC CATHETERIZATION  2009  . CATARACT EXTRACTION W/PHACO Right 10/17/2017   Procedure: CATARACT EXTRACTION PHACO AND INTRAOCULAR LENS PLACEMENT (IOC) RIGHT;  Surgeon: [name], MD;  Location: [facility];  Service: Ophthalmology;  Laterality: Right;  . HAND SURGERY  1999  . PARTIAL KNEE ARTHROPLASTY Left   . TOTAL KNEE ARTHROPLASTY Right 06/17/2017    Prior to Admission medications   Medication Sig Start Date End Date Taking? Authorizing Provider  acetaminophen (TYLENOL) 500 MG tablet Take 1,000 mg by mouth daily as needed for moderate pain or headache.    [provider]  amoxicillin (AMOXIL) 500 MG tablet Take 2,000 mg as needed by mouth (before dental procedures).    [provider]  BIOTIN PO Take 1 tablet by mouth daily.    [provider]  Carboxymethylcellul-Glycerin (LUBRICATING EYE DROPS OP) Apply 1 drop to eye 2 (two) times daily as needed (dry eyes).    [provider]  diclofenac (VOLTAREN) 75 MG EC tablet TAKE 1 TABLET(S) TWICE A DAY BY ORAL ROUTE. 08/04/15   [provider]  dutasteride (AVODART) 0.5 MG capsule Take 0.5 mg by mouth every other day.    [provider]  gabapentin (NEURONTIN) 300 MG capsule Take 300 mg by mouth 4 (four) times daily.    [provider]  levothyroxine (SYNTHROID, LEVOTHROID) 25 MCG tablet Take 25 mcg by mouth daily before breakfast.    [provider]  Magnesium Oxide 400 (240 MG) MG TABS Take 1 tablet by mouth 2 (two) times daily. 06/20/12   Iran Ouch, MD  metoprolol succinate (TOPROL-XL) 25 MG 24 hr tablet TAKE 1 TABLET DAILY Patient taking differently: qhs 03/12/17   Iran Ouch, MD  Omega-3 Fatty Acids (FISH OIL) 1200 MG CAPS Take 1,200 mg by mouth 2 (two) times daily.    [provider]  omeprazole (PRILOSEC) 20 MG capsule Take 20 mg by mouth daily.      [provider]  rivaroxaban (XARELTO) 20 MG TABS tablet Take 1 tablet (20 mg total) by mouth daily with supper. 10/30/17   Iran Ouch, MD  tamsulosin (FLOMAX) 0.4 MG CAPS capsule Take 1 capsule (0.4 mg total) by mouth daily. 11/15/17   Stoioff, Verna Czech, MD    Allergies Patient has no known allergies.  Family History  Problem Relation Age of Onset  . Heart attack Father 6  . Coronary artery disease Unknown        family hx  . Heart failure Unknown        family hx - CHF    Social History Social History   Tobacco Use  . Smoking status: Never Smoker  . Smokeless tobacco: Never Used  . Tobacco comment: tobacco use - no  Substance Use Topics    . Alcohol use: No  . Drug use: No    Review of Systems  Constitutional: Negative for fever. Eyes: Negative for visual changes. ENT: Negative for sore throat. Neck: No neck pain  Cardiovascular: Negative for chest pain. Respiratory: Negative for shortness of breath. Gastrointestinal: Negative for abdominal pain, vomiting or diarrhea. Genitourinary: Negative for dysuria. Musculoskeletal: Negative for back pain. Skin: Negative for rash. Neurological: Negative for headaches, weakness or numbness. Psych: No SI or HI  ____________________________________________   PHYSICAL EXAM:  VITAL SIGNS: Vitals:   11/22/17 1700 11/22/17 1720  BP: (!) 131/98 130/88  Pulse: 61 60  Resp: 17 14  SpO2: 97% 96%   Constitutional: Alert and oriented. Well appearing and in no apparent distress. HEENT:      Head: Normocephalic and atraumatic.         Eyes: Conjunctivae are normal. Sclera is non-icteric.  Mouth/Throat: Mucous membranes are moist.       Neck: Supple with no signs of meningismus. Cardiovascular: Regular rate and rhythm. No murmurs, gallops, or rubs. 2+ symmetrical distal pulses are present in all extremities. No JVD. Respiratory: Normal respiratory effort. Lungs are clear to auscultation bilaterally. No wheezes, crackles, or rhonchi.  Gastrointestinal: Soft, non tender, and non distended with positive bowel sounds. No rebound or guarding. Musculoskeletal: Trace pitting edema b/l Neurologic: Normal speech and language. Face is symmetric. Moving all extremities. No gross focal neurologic deficits are appreciated. Skin: Skin is warm, dry and intact. No rash noted. Psychiatric: Mood and affect are normal. Speech and behavior are normal.  ____________________________________________   LABS (all labs ordered are listed, but only abnormal results are displayed)  Labs Reviewed  BASIC METABOLIC PANEL - Abnormal; Notable for the following components:      Result Value   Glucose,  Bld 107 (*)    All other components within normal limits  T4, FREE - Abnormal; Notable for the following components:   Free T4 1.32 (*)    All other components within normal limits  CBC  MAGNESIUM  TSH   ____________________________________________  EKG  ED ECG REPORT I, Nita Sickle, the attending physician, personally viewed and interpreted this ECG.  SVT, rate of 149, left axis deviation, normal QTC, no ST elevation or depression. Normal sinus rhythm has been replaced by SVT when compared to prior from November 2018  16:18 - NSR, rate 66, normal intervals, normal axis, no ST elevations or depressions, diffuse T-wave flattening. Unchanged from prior from 09/2017 ____________________________________________  RADIOLOGY  CXR: No active cardiopulmonary disease.  ____________________________________________   PROCEDURES  Procedure(s) performed: None Procedures Critical Care performed:  None ____________________________________________   INITIAL IMPRESSION / ASSESSMENT AND PLAN / ED COURSE   77 y.o. 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 Patient converted to NSR prior to my eval. Patient completely asymptomatic from this episode of SVT, remains asymptomatic now in NSR. Will check labs to rule out dehydration, electrolyte abnormalities, thyroid abnormalities, anemia. Will monitor on telemetry. Will give IV magnesium for prevention. If patient remains in NSR and asymptomatic with normal labs anticipate dc home.    _________________________ 5:49 PM on 11/22/2017 -----------------------------------------  Patient monitored in the ED for several hours with no further episode of SVT. Remains extremely well appearing. Labs with no acute findings. Patient stable for DC at this time, recommended close  follow-up with his cardiologist Dr. Kirke Corin. Discussed return precautions with patient and his wife.   As part of my medical decision making, I reviewed the following data within the electronic MEDICAL RECORD NUMBER Nursing notes reviewed and incorporated, Labs reviewed , EKG interpreted , Old EKG reviewed, Old chart reviewed, Radiograph reviewed , Notes from prior ED visits and [place] Controlled Substance Database    Pertinent labs & imaging results that were available during my care of the patient were reviewed by me and considered in my medical decision making (see chart for details).    ____________________________________________   FINAL CLINICAL IMPRESSION(S) / ED DIAGNOSES  Final diagnoses:  SVT (supraventricular tachycardia) (HCC)      NEW MEDICATIONS STARTED DURING THIS VISIT:  ED Discharge Orders    None       Note:  This document was prepared using Dragon voice recognition software and may include unintentional dictation errors.  Don Perking, [place], MD 11/22/17 1750

## 2017-11-22 NOTE — Telephone Encounter (Signed)
   [facility] Pre-operative Risk Assessment    Request for surgical clearance:  1. What type of surgery is being performed? dupytren contracture release  2. When is this surgery scheduled? 11/28/2017  3. Are there any medications that need to be held prior to surgery and how long?not listed  4. Practice name and name of physician performing surgery? [facility] pre admit testing  What is your office phone and fax number? [phone] fax [phone] Anesthesia type (None, local, MAC, general) ? Not listed  Vincent [name] 11/22/2017, 4:40 PM  _________________________________________________________________   (provider comments below)

## 2017-11-22 NOTE — ED Notes (Signed)
Patient transported to X-ray 

## 2017-11-23 NOTE — Telephone Encounter (Signed)
Paperwork received yesterday for surgical clearance- was given to Jasmine December to review with Dr. Kirke Corin next week.

## 2017-11-23 NOTE — Telephone Encounter (Signed)
[facility] calling to confirm that we are aware of the new Cardiac Clearance that was sent after his visit from the ER on 1/3

## 2017-11-27 ENCOUNTER — Encounter: Payer: Self-pay | Admitting: Cardiovascular Disease

## 2017-11-27 ENCOUNTER — Ambulatory Visit (INDEPENDENT_AMBULATORY_CARE_PROVIDER_SITE_OTHER): Payer: Medicare Other | Admitting: Cardiovascular Disease

## 2017-11-27 VITALS — BP 98/64 | HR 70 | Ht 64.5 in | Wt 223.5 lb

## 2017-11-27 DIAGNOSIS — I493 Ventricular premature depolarization: Secondary | ICD-10-CM

## 2017-11-27 DIAGNOSIS — I471 Supraventricular tachycardia: Secondary | ICD-10-CM

## 2017-11-27 DIAGNOSIS — I428 Other cardiomyopathies: Secondary | ICD-10-CM | POA: Diagnosis not present

## 2017-11-27 DIAGNOSIS — I480 Paroxysmal atrial fibrillation: Secondary | ICD-10-CM

## 2017-11-27 MED ORDER — CLINDAMYCIN PHOSPHATE 600 MG/50ML IV SOLN
600.0000 mg | INTRAVENOUS | Status: AC
  Administered 2017-11-28: 600 mg via INTRAVENOUS

## 2017-11-27 MED ORDER — CEFAZOLIN SODIUM-DEXTROSE 2-4 GM/100ML-% IV SOLN
2.0000 g | INTRAVENOUS | Status: AC
  Administered 2017-11-28: 2 g via INTRAVENOUS

## 2017-11-27 NOTE — Telephone Encounter (Signed)
Faxed to [facility], [phone] and [facility], [phone] Left message for Cheri.

## 2017-11-27 NOTE — Telephone Encounter (Signed)
Please call Cheri at [facility] [phone] ext [phone] after ov today to let her know if patient is clear or not clear for Procedure tomorrow   If not clear sx for tomorrow has to be cancelled   Direct fax to cheri is [phone]

## 2017-11-27 NOTE — Telephone Encounter (Signed)
To Dr. Jari Sportsman nurse.  Patient in clinic for an office visit now.

## 2017-11-27 NOTE — Patient Instructions (Signed)
Medication Instructions:  Your physician recommends that you continue on your current medications as directed. Please refer to the Current Medication list given to you today.   Labwork: none  Testing/Procedures: none  Follow-Up: Your physician recommends that you schedule a follow-up appointment with Dr. Kirke Corin based on your appointment with Dr. [name].    Any Other Special Instructions Will Be Listed Below (If Applicable). Referral to Dr. [name], EP     If you need a refill on your cardiac medications before your next appointment, please call your pharmacy.

## 2017-11-27 NOTE — Progress Notes (Signed)
Cardiology Office Note   Date:  11/27/2017   ID:  Vincent [name], DOB 04/26/1941, MRN [id number]  PCP:  [name], MD  Cardiologist:   [name], MD   Chief Complaint  Patient presents with  . other    SVT and cardiac clearance . Meds reviewed verbally with pt.      History of Present Illness: Vincent [name] is a 77 y.o. male who presents for a follow up regarding supraventricular tachycardia, mild cardiomyopathy, paroxysmal atrial fibrillation and ventricular bigeminy.  Catheterization from 2009 showed an ejection fraction of 40-45% range with a mild nonobstructive 20% plaque in his LAD.  PVCs have been treated with metoprolol and magnesium. Most recent nuclear stress test in 2013 was normal. He had postoperative atrial fibrillation in December 2015 after a ruptured appendix.  He was initially not anticoagulated but was seen recently with worsening palpitations over the last 6 months.  During 1 of the routine office visit with his urologist he was noted to be tachycardic with heart rate ranging from 110-150 bpm.  Due to suspected atrial fibrillation and his previous episode in 2015, I elected to start him on anticoagulation. A Holter monitor was done which showed frequent PVCs (6% burden) and intermittent runs of supraventricular tachycardia. Echocardiogram last month showed an EF of 45-50%, mild biatrial enlargement. The patient needs to have hand surgery tomorrow.  He went for preop on the third and was noted to be tachycardic.  He was sent to the emergency room and he was noted to be in supraventricular tachycardia.  He converted to sinus rhythm without intervention.  His labs were unremarkable except for slightly elevated T4 but his TSH was normal. Overall, he does not seem to be symptomatic during episodes of tachycardia.  No chest pain or shortness of breath.  Past Medical History:  Diagnosis Date  . Arthritis   . Dizziness and giddiness   . Dysrhythmia    . GERD (gastroesophageal reflux disease)   . History of NICM    a. Prev EF as low as 35-40%;  b. 03/2012 Echo: EF 50-55%, Gr 1 DD, mild MR;  c. 06/2014 Echo: EF 50-55%, gr 1 DD, mild MR, mildly dil LA.  [name] History of systolic CHF    a. Prev EF as low as 35-40%;  b. 03/2012 Echo: EF 50-55%, Gr 1 DD, mild MR;  c. 06/2014 Echo: EF 50-55%, gr 1 DD, mild MR, mildly dil LA.  [name] Hypothyroidism   . Non-obstructive CAD    a. 2009 nonobs dzs, LAD 20, EF 40-45%;  b. 07/2012 Ex MV: EF 55%, freq pvc's, no ischemia/infarct.  [name] PAF (paroxysmal atrial fibrillation) (HCC)    a. 11/2013 - occurred 3 wks post-op Appe in setting of pelvic abscess-->amio-->converted but amio later d/c'd 2/2 hypothyroidism.  . Paroxysmal supraventricular tachycardia (HCC)    a. SVT/PAT  . Prostate enlargement   . PSVT (paroxysmal supraventricular tachycardia) (HCC)    a. 11/2013.  [name] PVC's (premature ventricular contractions)    a. Asymptomatic - managed with Toprol and Mg.  . Sciatica of right side     Past Surgical History:  Procedure Laterality Date  . ABSCESS DRAINAGE    . APPENDECTOMY  2015  . CARDIAC CATHETERIZATION  2009  . CATARACT EXTRACTION W/PHACO Right 10/17/2017   Procedure: CATARACT EXTRACTION PHACO AND INTRAOCULAR LENS PLACEMENT ([facility]) RIGHT;  Surgeon: [name], MD;  Location: [facility];  Service: Ophthalmology;  Laterality: Right;  . HAND SURGERY  1999  . PARTIAL KNEE ARTHROPLASTY Left   . TOTAL KNEE ARTHROPLASTY Right 06/17/2017     Current Outpatient Medications  Medication Sig Dispense Refill  . acetaminophen (TYLENOL) 500 MG tablet Take 1,000 mg by mouth daily as needed for moderate pain or headache.    [name] amoxicillin (AMOXIL) 500 MG tablet Take 2,000 mg as needed by mouth (before dental procedures).    [name] BIOTIN PO Take 1 tablet by mouth daily.    . Carboxymethylcellul-Glycerin (LUBRICATING EYE DROPS OP) Apply 1 drop to eye 2 (two) times daily as needed (dry eyes).    [name] diclofenac  (VOLTAREN) 75 MG EC tablet TAKE 1 TABLET(S) TWICE A DAY BY ORAL ROUTE.    [name] dutasteride (AVODART) 0.5 MG capsule Take 0.5 mg by mouth every other day.    . gabapentin (NEURONTIN) 300 MG capsule Take 300 mg by mouth 4 (four) times daily.    [name] levothyroxine (SYNTHROID, LEVOTHROID) 25 MCG tablet Take 25 mcg by mouth daily before breakfast.    . Magnesium Oxide 400 (240 MG) MG TABS Take 1 tablet by mouth 2 (two) times daily. 60 tablet 6  . metoprolol succinate (TOPROL-XL) 25 MG 24 hr tablet TAKE 1 TABLET DAILY (Patient taking differently: qhs) 90 tablet 3  . Omega-3 Fatty Acids (FISH OIL) 1200 MG CAPS Take 1,200 mg by mouth 2 (two) times daily.    [name] omeprazole (PRILOSEC) 20 MG capsule Take 20 mg by mouth daily.      . rivaroxaban (XARELTO) 20 MG TABS tablet Take 1 tablet (20 mg total) by mouth daily with supper. 90 tablet 3  . tamsulosin (FLOMAX) 0.4 MG CAPS capsule Take 1 capsule (0.4 mg total) by mouth daily. 30 capsule 0   No current facility-administered medications for this visit.     Allergies:   Patient has no known allergies.    Social History:  The patient  reports that  has never smoked. he has never used smokeless tobacco. He reports that he does not drink alcohol or use drugs.   Family History:  The patient's family history includes Coronary artery disease in his unknown relative; Heart attack (age of onset: 55) in his father; Heart failure in his unknown relative.    ROS:  Please see the history of present illness.   Otherwise, review of systems are positive for none.   All other systems are reviewed and negative.    PHYSICAL EXAM: VS:  BP 98/64 (BP Location: Left Arm, Patient Position: Sitting, Cuff Size: Normal)   Pulse 70   Ht 5' 4.5" (1.638 m)   Wt 223 lb 8 oz (101.4 kg)   BMI 37.77 kg/m  , BMI Body mass index is 37.77 kg/m. GEN: Well nourished, well developed, in no acute distress  HEENT: normal  Neck: no JVD, carotid bruits, or masses Cardiac: RRR; no murmurs, rubs,  or gallops,no edema  Respiratory:  clear to auscultation bilaterally, normal work of breathing GI: soft, nontender, nondistended, + BS MS: no deformity or atrophy  Skin: warm and dry, no rash Neuro:  Strength and sensation are intact Psych: euthymic mood, full affect   EKG:  EKG is ordered today. The ekg ordered today demonstrates normal sinus rhythm with nonspecific ST changes.  Low voltage   Recent Labs: 11/22/2017: BUN 19; Creatinine, Ser 1.06; Hemoglobin 14.4; Magnesium 2.0; Platelets 207; Potassium 4.3; Sodium 136; TSH 2.082    Lipid Panel    Component Value Date/Time   CHOL 218 10/25/2009   TRIG  189 10/25/2009   HDL 37.3 10/25/2009   LDLCALC 142.9 10/25/2009      Wt Readings from Last 3 Encounters:  11/27/17 223 lb 8 oz (101.4 kg)  11/22/17 212 lb (96.2 kg)  11/22/17 212 lb (96.2 kg)      No flowsheet data found.    ASSESSMENT AND PLAN:  1.  Paroxysmal atrial fibrillation: Recent episodes of palpitations and tachycardia might have been related to SVT and not atrial fibrillation.  He did have prior documented postoperative atrial fibrillation and given that I could not exclude the possibility of recurrent atrial fibrillation, I elected to anticoagulate him.  Continue Xarelto for now.  2.  Paroxysmal supraventricular tachycardia: He is clearly having more episodes of this.  Although he is asymptomatic, I worry about underlying cardiomyopathy and possible worsening of left untreated.  I am referring him back to Dr. [name] to consider ablation or an antiarrhythmic.   3. Frequent PVCs: These were thought to be controlled.  However, recent Holter monitor showed a 6% burden.  This might need to be treated as well to prevent further decline in ejection fraction.  4. History of nonischemic cardiomyopathy: Most recent echo showed mildly reduced EF.  Continue Toprol.  Blood pressure is low and does not allow the addition of an ACE inhibitor or ARB.  5.  Preoperative  cardiovascular evaluation for hand surgery: The patient is at low risk overall from a cardiac standpoint given lack of symptoms and good functional capacity.     Disposition:   I referred him back to Dr. [name].  Patient can proceed with his hand surgery tomorrow.  Signed,  [name], MD  11/27/2017 2:22 PM    [facility]

## 2017-11-28 ENCOUNTER — Ambulatory Visit: Payer: Medicare Other | Admitting: Anesthesiology

## 2017-11-28 ENCOUNTER — Ambulatory Visit
Admission: RE | Admit: 2017-11-28 | Discharge: 2017-11-28 | Disposition: A | Discharge status: Home / Self Care | Payer: Medicare Other | Source: Ambulatory Visit | Attending: Specialist | Admitting: Specialist

## 2017-11-28 ENCOUNTER — Encounter
Admission: RE | Disposition: A | Discharge status: Home / Self Care | Payer: Self-pay | Source: Ambulatory Visit | Attending: Specialist

## 2017-11-28 DIAGNOSIS — I2510 Atherosclerotic heart disease of native coronary artery without angina pectoris: Secondary | ICD-10-CM | POA: Diagnosis not present

## 2017-11-28 DIAGNOSIS — I10 Essential (primary) hypertension: Secondary | ICD-10-CM | POA: Diagnosis not present

## 2017-11-28 DIAGNOSIS — I471 Supraventricular tachycardia: Secondary | ICD-10-CM | POA: Insufficient documentation

## 2017-11-28 DIAGNOSIS — K219 Gastro-esophageal reflux disease without esophagitis: Secondary | ICD-10-CM | POA: Diagnosis not present

## 2017-11-28 DIAGNOSIS — M720 Palmar fascial fibromatosis [Dupuytren]: Secondary | ICD-10-CM | POA: Diagnosis present

## 2017-11-28 DIAGNOSIS — Z79899 Other long term (current) drug therapy: Secondary | ICD-10-CM | POA: Insufficient documentation

## 2017-11-28 DIAGNOSIS — I4891 Unspecified atrial fibrillation: Secondary | ICD-10-CM | POA: Insufficient documentation

## 2017-11-28 DIAGNOSIS — E039 Hypothyroidism, unspecified: Secondary | ICD-10-CM | POA: Insufficient documentation

## 2017-11-28 DIAGNOSIS — Z7902 Long term (current) use of antithrombotics/antiplatelets: Secondary | ICD-10-CM | POA: Diagnosis not present

## 2017-11-28 HISTORY — PX: DUPUYTREN CONTRACTURE RELEASE: SHX1478

## 2017-11-28 SURGERY — RELEASE, DUPUYTREN CONTRACTURE
Anesthesia: General | Laterality: Left

## 2017-11-28 MED ORDER — PROPOFOL 10 MG/ML IV BOLUS
INTRAVENOUS | Status: AC
  Filled 2017-11-28: qty 20

## 2017-11-28 MED ORDER — FENTANYL CITRATE (PF) 100 MCG/2ML IJ SOLN: 25.0000 ug | INTRAMUSCULAR | Status: DC | PRN

## 2017-11-28 MED ORDER — HYDROCODONE-ACETAMINOPHEN 7.5-325 MG PO TABS: 1.0000 | ORAL_TABLET | Freq: Four times a day (QID) | ORAL | 0 refills | Status: DC | PRN

## 2017-11-28 MED ORDER — PROPOFOL 10 MG/ML IV BOLUS
INTRAVENOUS | Status: DC | PRN
  Administered 2017-11-28: 160 mg via INTRAVENOUS

## 2017-11-28 MED ORDER — DEXAMETHASONE SODIUM PHOSPHATE 10 MG/ML IJ SOLN
INTRAMUSCULAR | Status: DC | PRN
  Administered 2017-11-28: 10 mg via INTRAVENOUS

## 2017-11-28 MED ORDER — CEFAZOLIN SODIUM-DEXTROSE 2-4 GM/100ML-% IV SOLN
INTRAVENOUS | Status: AC
  Filled 2017-11-28: qty 100

## 2017-11-28 MED ORDER — CLINDAMYCIN PHOSPHATE 600 MG/50ML IV SOLN
INTRAVENOUS | Status: AC
  Filled 2017-11-28: qty 50

## 2017-11-28 MED ORDER — FENTANYL CITRATE (PF) 100 MCG/2ML IJ SOLN
INTRAMUSCULAR | Status: AC
  Filled 2017-11-28: qty 2

## 2017-11-28 MED ORDER — LIDOCAINE HCL (CARDIAC) 20 MG/ML IV SOLN
INTRAVENOUS | Status: DC | PRN
  Administered 2017-11-28: 100 mg via INTRAVENOUS

## 2017-11-28 MED ORDER — MELOXICAM 7.5 MG PO TABS
15.0000 mg | ORAL_TABLET | ORAL | Status: AC
  Administered 2017-11-28: 15 mg via ORAL

## 2017-11-28 MED ORDER — BUPIVACAINE HCL (PF) 0.5 % IJ SOLN
INTRAMUSCULAR | Status: AC
  Filled 2017-11-28: qty 30

## 2017-11-28 MED ORDER — ONDANSETRON HCL 4 MG/2ML IJ SOLN
INTRAMUSCULAR | Status: DC | PRN
  Administered 2017-11-28: 4 mg via INTRAVENOUS

## 2017-11-28 MED ORDER — MELOXICAM 7.5 MG PO TABS
ORAL_TABLET | ORAL | Status: AC
  Administered 2017-11-28: 15 mg via ORAL
  Filled 2017-11-28: qty 2

## 2017-11-28 MED ORDER — BUPIVACAINE HCL (PF) 0.5 % IJ SOLN
INTRAMUSCULAR | Status: DC | PRN
  Administered 2017-11-28: 30 mL

## 2017-11-28 MED ORDER — GABAPENTIN 400 MG PO CAPS: 400.0000 mg | ORAL_CAPSULE | ORAL | Status: DC

## 2017-11-28 MED ORDER — FENTANYL CITRATE (PF) 100 MCG/2ML IJ SOLN
INTRAMUSCULAR | Status: DC | PRN
  Administered 2017-11-28 (×2): 25 ug via INTRAVENOUS
  Administered 2017-11-28: 50 ug via INTRAVENOUS

## 2017-11-28 MED ORDER — ACETAMINOPHEN 10 MG/ML IV SOLN
INTRAVENOUS | Status: AC
  Filled 2017-11-28: qty 100

## 2017-11-28 MED ORDER — GLYCOPYRROLATE 0.2 MG/ML IJ SOLN
INTRAMUSCULAR | Status: DC | PRN
  Administered 2017-11-28: 0.2 mg via INTRAVENOUS

## 2017-11-28 MED ORDER — LACTATED RINGERS IV SOLN
INTRAVENOUS | Status: DC
  Administered 2017-11-28 (×2): via INTRAVENOUS

## 2017-11-28 MED ORDER — SEVOFLURANE IN SOLN
RESPIRATORY_TRACT | Status: AC
  Filled 2017-11-28: qty 250

## 2017-11-28 MED ORDER — ONDANSETRON HCL 4 MG/2ML IJ SOLN: 4.0000 mg | Freq: Once | INTRAMUSCULAR | Status: DC | PRN

## 2017-11-28 MED ORDER — GABAPENTIN 400 MG PO CAPS
ORAL_CAPSULE | ORAL | Status: AC
  Filled 2017-11-28: qty 1

## 2017-11-28 MED ORDER — ACETAMINOPHEN 10 MG/ML IV SOLN
INTRAVENOUS | Status: DC | PRN
  Administered 2017-11-28: 1000 mg via INTRAVENOUS

## 2017-11-28 SURGICAL SUPPLY — 33 items
BLADE SURG MINI STRL (BLADE) ×4 IMPLANT
BNDG ESMARK 4X12 TAN STRL LF (GAUZE/BANDAGES/DRESSINGS) ×2 IMPLANT
CANISTER SUCT 1200ML W/VALVE (MISCELLANEOUS) ×2 IMPLANT
CHLORAPREP W/TINT 26ML (MISCELLANEOUS) ×2 IMPLANT
CUFF TOURN 18 STER (MISCELLANEOUS) ×2 IMPLANT
DECANTER SPIKE VIAL GLASS SM (MISCELLANEOUS) IMPLANT
ELECT REM PT RETURN 9FT ADLT (ELECTROSURGICAL) ×2
ELECTRODE REM PT RTRN 9FT ADLT (ELECTROSURGICAL) ×1 IMPLANT
GAUZE FLUFF 18X24 1PLY STRL (GAUZE/BANDAGES/DRESSINGS) ×2 IMPLANT
GAUZE PETRO XEROFOAM 1X8 (MISCELLANEOUS) ×4 IMPLANT
GAUZE SPONGE 4X4 12PLY STRL (GAUZE/BANDAGES/DRESSINGS) ×2 IMPLANT
GLOVE SURG ORTHO 8.0 STRL STRW (GLOVE) ×4 IMPLANT
GOWN STRL REUS W/ TWL LRG LVL3 (GOWN DISPOSABLE) ×1 IMPLANT
GOWN STRL REUS W/TWL LRG LVL3 (GOWN DISPOSABLE) ×1
GOWN STRL REUS W/TWL LRG LVL4 (GOWN DISPOSABLE) ×4 IMPLANT
KIT RM TURNOVER STRD PROC AR (KITS) ×2 IMPLANT
NS IRRIG 500ML POUR BTL (IV SOLUTION) ×2 IMPLANT
PACK EXTREMITY ARMC (MISCELLANEOUS) ×2 IMPLANT
PAD CAST CTTN 4X4 STRL (SOFTGOODS) ×2 IMPLANT
PADDING CAST COTTON 4X4 STRL (SOFTGOODS) ×2
SPLINT CAST 1 STEP 3X12 (MISCELLANEOUS) IMPLANT
SPLINT CAST 1 STEP 4X15 (MISCELLANEOUS) ×2 IMPLANT
STOCKINETTE BIAS CUT 4 980044 (GAUZE/BANDAGES/DRESSINGS) ×2 IMPLANT
STOCKINETTE STRL 4IN 9604848 (GAUZE/BANDAGES/DRESSINGS) ×2 IMPLANT
STRAP SAFETY BODY (MISCELLANEOUS) ×2 IMPLANT
SUT ETHILON 4-0 (SUTURE) ×2
SUT ETHILON 4-0 FS2 18XMFL BLK (SUTURE) ×2
SUT ETHILON 5-0 (SUTURE) ×1
SUT ETHILON 5-0 C-3 18XMFL BLK (SUTURE) ×1
SUT ETHILON 5-0 FS-2 18 BLK (SUTURE) ×4 IMPLANT
SUT VIC AB 4-0 FS2 27 (SUTURE) ×2 IMPLANT
SUTURE ETHLN 4-0 FS2 18XMF BLK (SUTURE) ×2 IMPLANT
SUTURE ETHLN 5-0 C3 18XMF BLK (SUTURE) ×1 IMPLANT

## 2017-11-28 NOTE — Transfer of Care (Signed)
Immediate Anesthesia Transfer of Care Note  Patient: Vincent [name]  Procedure(s) Performed: DUPUYTREN CONTRACTURE RELEASE (Left )  Patient Location: PACU  Anesthesia Type:General  Level of Consciousness: sedated  Airway & Oxygen Therapy: Patient Spontanous Breathing  Post-op Assessment: Report given to RN and Post -op Vital signs reviewed and stable  Post vital signs: Reviewed and stable  Last Vitals:  Vitals:   11/28/17 0614  BP: (!) 80/66  Pulse: (!) 54  Resp: 15  Temp: (!) 35.9 C  SpO2: 100%    Last Pain:  Vitals:   11/28/17 0614  TempSrc: Tympanic         Complications: No apparent anesthesia complications

## 2017-11-28 NOTE — Anesthesia Postprocedure Evaluation (Signed)
Anesthesia Post Note  Patient: Vincent [name]  Procedure(s) Performed: DUPUYTREN CONTRACTURE RELEASE (Left )  Patient location during evaluation: PACU Anesthesia Type: General Level of consciousness: awake and alert Pain management: pain level controlled Vital Signs Assessment: post-procedure vital signs reviewed and stable Respiratory status: spontaneous breathing and respiratory function stable Cardiovascular status: stable Anesthetic complications: no     Last Vitals:  Vitals:   11/28/17 1030 11/28/17 1036  BP:  (!) 119/97  Pulse: 65 (!) 43  Resp: 13 12  Temp:    SpO2: 100% 94%    Last Pain:  Vitals:   11/28/17 1015  TempSrc:   PainSc: Asleep                 Camri Molloy K

## 2017-11-28 NOTE — Anesthesia Post-op Follow-up Note (Signed)
Anesthesia QCDR form completed.        

## 2017-11-28 NOTE — Discharge Instructions (Signed)

## 2017-11-28 NOTE — Anesthesia Preprocedure Evaluation (Signed)
Anesthesia Evaluation  Patient identified by MRN, date of birth, ID band Patient awake    Reviewed: Allergy & Precautions, NPO status , Patient's Chart, lab work & pertinent test results  History of Anesthesia Complications Negative for: history of anesthetic complications  Airway Mallampati: II       Dental   Pulmonary neg sleep apnea, neg COPD,           Cardiovascular (-) hypertension+ CAD  (-) Past MI + dysrhythmias Atrial Fibrillation and Supra Ventricular Tachycardia (-) Valvular Problems/Murmurs     Neuro/Psych neg Seizures    GI/Hepatic Neg liver ROS, GERD  Medicated and Controlled,  Endo/Other  neg diabetesHypothyroidism   Renal/GU negative Renal ROS     Musculoskeletal   Abdominal   Peds  Hematology   Anesthesia Other Findings   Reproductive/Obstetrics                             Anesthesia Physical Anesthesia Plan  ASA: III  Anesthesia Plan: General   Post-op Pain Management:    Induction: Intravenous  PONV Risk Score and Plan: 2 and Ondansetron and Dexamethasone  Airway Management Planned: LMA  Additional Equipment:   Intra-op Plan:   Post-operative Plan:   Informed Consent: I have reviewed the patients History and Physical, chart, labs and discussed the procedure including the risks, benefits and alternatives for the proposed anesthesia with the patient or authorized representative who has indicated his/her understanding and acceptance.     Plan Discussed with:   Anesthesia Plan Comments:         Anesthesia Quick Evaluation

## 2017-11-28 NOTE — Progress Notes (Signed)
Can wiggle fingers  Extremity warm and dry

## 2017-11-28 NOTE — Op Note (Signed)
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    The risks benefits and alternatives were discussed with the patient preoperatively including but not limited to the risks of infection, bleeding, nerve injury, cardiopulmonary complications, the need for revision surgery, among others, and the patient was willing to proceed.  OPERATIVE IMPLANTS: None  OPERATIVE FINDINGS: Severe contracture left ring finger PIP joint from ulnar band originating from the metacarpal head fascia.  Contracture left index finger PIP joint from radial band originating from the metacarpal head fascia  OPERATIVE PROCEDURE: The patient was brought to the operating room where he underwent satisfactory general LMA anesthesia in the supine position.  The  arm was prepped and draped in a sterile fashion.  Esmarch was applied and tourniquet inflated to 250 mmHg.   A Bruner zigzag incision was made starting at the distal portion of the ring finger crossing the MP level and extending down into the palm.  Careful, tedious dissection was carried out using loupe magnification.  The thick band ulnarly was identified coming off the fascia over the metacarpal head.  Dissection was then carried out distally, carefully protecting the radial and ulnar digital nerves and vessels to the ring finger.  The dissection was carried out onto the  finger and released from its attachments to the skin and tendon sheaths.  Both neurovascular bundles remained intact.  Similar procedure was then carried out on the index finger regarding over the middle phalanx and zigzagging down into the palm.  The radial band was released proximally and followed distally.  Neuro nerves to both sides of the finger were identified and protected.  Entire disease was released out past the PIP joint.  Good extension of the PIP joint was then possible.  The contracture was completely released.  Sponges were applied to the wound and the tourniquet was deflated and showed excellent return of blood flow to all fingers.  After bleeding was controlled, the wound was closed with 5-0 nylon sutures. Sponge and needle counts were correct.  Half percent Marcaine was infiltrated into the tissues.  Dry sterile hand dressing and volar splint were applied.  Patient was awakened and taken to recovery in good condition.   Valinda Hoar, M.D.

## 2017-11-28 NOTE — Anesthesia Procedure Notes (Signed)
Procedure Name: LMA Insertion Date/Time: 11/28/2017 7:46 AM Performed by: Junious Silk, CRNA Pre-anesthesia Checklist: Patient identified, Patient being monitored, Timeout performed, Emergency Drugs available and Suction available Patient Re-evaluated:Patient Re-evaluated prior to induction Oxygen Delivery Method: Circle system utilized Preoxygenation: Pre-oxygenation with 100% oxygen Induction Type: IV induction Ventilation: Mask ventilation without difficulty LMA: LMA inserted LMA Size: 4.0 Tube type: Oral Number of attempts: 1 Placement Confirmation: positive ETCO2 and breath sounds checked- equal and bilateral Tube secured with: Tape Dental Injury: Teeth and Oropharynx as per pre-operative assessment

## 2017-11-28 NOTE — Progress Notes (Signed)
Continuing to elevate left upper extremity

## 2017-11-28 NOTE — H&P (Signed)
THE PATIENT WAS SEEN PRIOR TO SURGERY TODAY.  HISTORY, ALLERGIES, HOME MEDICATIONS AND OPERATIVE PROCEDURE WERE REVIEWED. RISKS AND BENEFITS OF SURGERY DISCUSSED WITH PATIENT AGAIN.  NO CHANGES FROM INITIAL HISTORY AND PHYSICAL NOTED.    

## 2017-11-29 ENCOUNTER — Encounter: Payer: Self-pay | Admitting: Specialist

## 2017-11-29 LAB — SURGICAL PATHOLOGY

## 2017-12-07 ENCOUNTER — Telehealth: Payer: Self-pay | Admitting: Urology

## 2017-12-07 MED ORDER — TAMSULOSIN HCL 0.4 MG PO CAPS: 0.4000 mg | ORAL_CAPSULE | Freq: Every day | ORAL | 3 refills | Status: DC

## 2017-12-07 NOTE — Telephone Encounter (Signed)
Patient called and is asking for a refill on his Flomax. He states that he has seen some improvement since he started taking it and wants [place] to [facility] a 90 days supply. Please call him back with questions @ [phone]  [facility]

## 2017-12-07 NOTE — Telephone Encounter (Signed)
Done

## 2017-12-19 ENCOUNTER — Encounter: Payer: Self-pay | Admitting: *Deleted

## 2017-12-19 ENCOUNTER — Other Ambulatory Visit: Payer: Self-pay

## 2017-12-21 NOTE — Discharge Instructions (Signed)

## 2017-12-24 ENCOUNTER — Ambulatory Visit: Payer: Medicare Other | Admitting: Anesthesiology

## 2017-12-24 ENCOUNTER — Encounter
Admission: RE | Disposition: A | Discharge status: Home / Self Care | Payer: Self-pay | Source: Ambulatory Visit | Attending: Ophthalmology

## 2017-12-24 ENCOUNTER — Ambulatory Visit
Admission: RE | Admit: 2017-12-24 | Discharge: 2017-12-24 | Disposition: A | Discharge status: Home / Self Care | Payer: Medicare Other | Source: Ambulatory Visit | Attending: Ophthalmology | Admitting: Ophthalmology

## 2017-12-24 DIAGNOSIS — Z8546 Personal history of malignant neoplasm of prostate: Secondary | ICD-10-CM | POA: Insufficient documentation

## 2017-12-24 DIAGNOSIS — E039 Hypothyroidism, unspecified: Secondary | ICD-10-CM | POA: Insufficient documentation

## 2017-12-24 DIAGNOSIS — E7800 Pure hypercholesterolemia, unspecified: Secondary | ICD-10-CM | POA: Diagnosis not present

## 2017-12-24 DIAGNOSIS — Z79899 Other long term (current) drug therapy: Secondary | ICD-10-CM | POA: Diagnosis not present

## 2017-12-24 DIAGNOSIS — I2510 Atherosclerotic heart disease of native coronary artery without angina pectoris: Secondary | ICD-10-CM | POA: Insufficient documentation

## 2017-12-24 DIAGNOSIS — H2512 Age-related nuclear cataract, left eye: Secondary | ICD-10-CM | POA: Diagnosis present

## 2017-12-24 DIAGNOSIS — I4891 Unspecified atrial fibrillation: Secondary | ICD-10-CM | POA: Insufficient documentation

## 2017-12-24 DIAGNOSIS — Z7902 Long term (current) use of antithrombotics/antiplatelets: Secondary | ICD-10-CM | POA: Insufficient documentation

## 2017-12-24 DIAGNOSIS — K219 Gastro-esophageal reflux disease without esophagitis: Secondary | ICD-10-CM | POA: Diagnosis not present

## 2017-12-24 HISTORY — PX: CATARACT EXTRACTION W/PHACO: SHX586

## 2017-12-24 SURGERY — PHACOEMULSIFICATION, CATARACT, WITH IOL INSERTION
Anesthesia: Monitor Anesthesia Care | Site: Eye | Laterality: Left | Wound class: Clean

## 2017-12-24 MED ORDER — MOXIFLOXACIN HCL 0.5 % OP SOLN
1.0000 [drp] | OPHTHALMIC | Status: DC | PRN
  Administered 2017-12-24 (×3): 1 [drp] via OPHTHALMIC

## 2017-12-24 MED ORDER — BALANCED SALT IO SOLN
INTRAOCULAR | Status: DC | PRN
  Administered 2017-12-24: 1 mL

## 2017-12-24 MED ORDER — ARMC OPHTHALMIC DILATING DROPS
1.0000 "application " | OPHTHALMIC | Status: DC | PRN
  Administered 2017-12-24 (×3): 1 via OPHTHALMIC

## 2017-12-24 MED ORDER — EPINEPHRINE PF 1 MG/ML IJ SOLN
INTRAOCULAR | Status: DC | PRN
  Administered 2017-12-24: 64 mL via OPHTHALMIC

## 2017-12-24 MED ORDER — BRIMONIDINE TARTRATE-TIMOLOL 0.2-0.5 % OP SOLN
OPHTHALMIC | Status: DC | PRN
  Administered 2017-12-24: 1 [drp] via OPHTHALMIC

## 2017-12-24 MED ORDER — ERYTHROMYCIN 5 MG/GM OP OINT
TOPICAL_OINTMENT | OPHTHALMIC | Status: DC | PRN
  Administered 2017-12-24: 1 via OPHTHALMIC

## 2017-12-24 MED ORDER — FENTANYL CITRATE (PF) 100 MCG/2ML IJ SOLN
INTRAMUSCULAR | Status: DC | PRN
  Administered 2017-12-24: 50 ug via INTRAVENOUS

## 2017-12-24 MED ORDER — NA HYALUR & NA CHOND-NA HYALUR 0.4-0.35 ML IO KIT
PACK | INTRAOCULAR | Status: DC | PRN
  Administered 2017-12-24: 1 mL via INTRAOCULAR

## 2017-12-24 MED ORDER — CEFUROXIME OPHTHALMIC INJECTION 1 MG/0.1 ML
INJECTION | OPHTHALMIC | Status: DC | PRN
  Administered 2017-12-24: 0.1 mL via INTRACAMERAL

## 2017-12-24 MED ORDER — MIDAZOLAM HCL 2 MG/2ML IJ SOLN
INTRAMUSCULAR | Status: DC | PRN
  Administered 2017-12-24: 2 mg via INTRAVENOUS

## 2017-12-24 MED ORDER — LACTATED RINGERS IV SOLN: 1000.0000 mL | INTRAVENOUS | Status: DC

## 2017-12-24 SURGICAL SUPPLY — 18 items
CANNULA ANT/CHMB 27GA (MISCELLANEOUS) ×2 IMPLANT
GLOVE SURG LX 7.5 STRW (GLOVE) ×1
GLOVE SURG LX STRL 7.5 STRW (GLOVE) ×1 IMPLANT
GLOVE SURG TRIUMPH 8.0 PF LTX (GLOVE) ×2 IMPLANT
GOWN STRL REUS W/ TWL LRG LVL3 (GOWN DISPOSABLE) ×2 IMPLANT
GOWN STRL REUS W/TWL LRG LVL3 (GOWN DISPOSABLE) ×2
LENS IOL TECNIS ITEC 14.5 (Intraocular Lens) ×2 IMPLANT
MARKER SKIN DUAL TIP RULER LAB (MISCELLANEOUS) ×2 IMPLANT
NEEDLE FILTER BLUNT 18X 1/2SAF (NEEDLE) ×1
NEEDLE FILTER BLUNT 18X1 1/2 (NEEDLE) ×1 IMPLANT
PACK CATARACT BRASINGTON (MISCELLANEOUS) ×2 IMPLANT
PACK EYE AFTER SURG (MISCELLANEOUS) ×2 IMPLANT
PACK OPTHALMIC (MISCELLANEOUS) ×2 IMPLANT
SYR 3ML LL SCALE MARK (SYRINGE) ×2 IMPLANT
SYR 5ML LL (SYRINGE) ×2 IMPLANT
SYR TB 1ML LUER SLIP (SYRINGE) ×2 IMPLANT
WATER STERILE IRR 500ML POUR (IV SOLUTION) ×2 IMPLANT
WIPE NON LINTING 3.25X3.25 (MISCELLANEOUS) ×2 IMPLANT

## 2017-12-24 NOTE — H&P (Signed)
The History and Physical notes are on paper, have been signed, and are to be scanned. The patient remains stable and unchanged from the H&P.   Previous H&P reviewed, patient examined, and there are no changes.  Aashka Salomone 12/24/2017 10:49 AM

## 2017-12-24 NOTE — Op Note (Signed)
OPERATIVE NOTE  Vincent [name] [id number] 12/24/2017   PREOPERATIVE DIAGNOSIS:  Nuclear sclerotic cataract left eye. H25.12   POSTOPERATIVE DIAGNOSIS:    Nuclear sclerotic cataract left eye.     PROCEDURE:  Phacoemusification with posterior chamber intraocular lens placement of the left eye   LENS:   Implant Name Type Inv. Item Serial No. Manufacturer Lot No. LRB No. Used  LENS IOL DIOP 14.5 - [id number] Intraocular Lens LENS IOL DIOP 14.5 [id number]  Left 1        ULTRASOUND TIME: 15  % of 1 minutes 6 seconds, CDE 10.2  SURGEON:  Deirdre Evener, MD   ANESTHESIA:  Topical with tetracaine drops and 2% Xylocaine jelly, augmented with 1% preservative-free intracameral lidocaine.    COMPLICATIONS:  None.   DESCRIPTION OF PROCEDURE:  The patient was identified in the holding room and transported to the operating room and placed in the supine position under the operating microscope.  The left eye was identified as the operative eye and it was prepped and draped in the usual sterile ophthalmic fashion.   A 1 millimeter clear-corneal paracentesis was made at the 1:30 position.  0.5 ml of preservative-free 1% lidocaine was injected into the anterior chamber.  The anterior chamber was filled with Viscoat viscoelastic.  A 2.4 millimeter keratome was used to make a near-clear corneal incision at the 10:30 position.  .  A curvilinear capsulorrhexis was made with a cystotome and capsulorrhexis forceps.  Balanced salt solution was used to hydrodissect and hydrodelineate the nucleus.   Phacoemulsification was then used in stop and chop fashion to remove the lens nucleus and epinucleus.  The remaining cortex was then removed using the irrigation and aspiration handpiece. Provisc was then placed into the capsular bag to distend it for lens placement.  A lens was then injected into the capsular bag.  The remaining viscoelastic was aspirated.   Wounds were hydrated with balanced salt  solution.  The anterior chamber was inflated to a physiologic pressure with balanced salt solution.  No wound leaks were noted. Cefuroxime 0.1 ml of a 10mg /ml solution was injected into the anterior chamber for a dose of 1 mg of intracameral antibiotic at the completion of the case.   Timolol and Brimonidine drops and Erythromycin ointment were applied to the eye.  The patient was taken to the recovery room in stable condition without complications of anesthesia or surgery.  Vincent [name] 12/24/2017, 12:04 PM

## 2017-12-24 NOTE — Anesthesia Postprocedure Evaluation (Signed)
Anesthesia Post Note  Patient: Vincent [name]  Procedure(s) Performed: CATARACT EXTRACTION PHACO AND INTRAOCULAR LENS PLACEMENT ([place]) LEFT (Left Eye)  Patient location during evaluation: PACU Anesthesia Type: MAC Level of consciousness: awake Pain management: pain level controlled Vital Signs Assessment: post-procedure vital signs reviewed and stable Respiratory status: spontaneous breathing Cardiovascular status: blood pressure returned to baseline Postop Assessment: no headache Anesthetic complications: no    Lavonna Monarch

## 2017-12-24 NOTE — Anesthesia Procedure Notes (Signed)
Procedure Name: MAC Date/Time: 12/24/2017 11:45 AM Performed by: Janna Arch, CRNA Pre-anesthesia Checklist: Patient identified, Emergency Drugs available, Suction available and Patient being monitored Patient Re-evaluated:Patient Re-evaluated prior to induction Oxygen Delivery Method: Nasal cannula

## 2017-12-24 NOTE — Anesthesia Preprocedure Evaluation (Signed)
Anesthesia Evaluation  Patient identified by MRN, date of birth, ID band Patient awake    Reviewed: Allergy & Precautions, NPO status , Patient's Chart, lab work & pertinent test results, reviewed documented beta blocker date and time   Airway Mallampati: III  TM Distance: >3 FB Neck ROM: Full    Dental no notable dental hx.    Pulmonary neg pulmonary ROS,    Pulmonary exam normal breath sounds clear to auscultation       Cardiovascular Exercise Tolerance: Good Angina: Nonobstructive CAD 2009, negative stress test 2013. 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 Saw cardiology 11/27/17, Holter shows SVT, PVCs, some bigeminy. No afib. Cardiology deemed patient low risk for hand surgery. Has appointment with EP cardiology   Neuro/Psych negative neurological ROS  negative psych ROS   GI/Hepatic Neg liver ROS, GERD  ,  Endo/Other  Hypothyroidism   Renal/GU negative Renal ROS     Musculoskeletal  (+) Arthritis ,   Abdominal (+) + obese,  Abdomen: soft.    Peds  Hematology negative hematology ROS (+)   Anesthesia Other Findings SVT rate 134, broke into slow sinus with PACs  Reproductive/Obstetrics                             Anesthesia Physical Anesthesia Plan  ASA: III  Anesthesia Plan: MAC   Post-op Pain Management:    Induction:   PONV Risk Score and Plan:   Airway Management Planned: Natural Airway  Additional Equipment: None  Intra-op Plan:   Post-operative Plan:   Informed Consent: I have reviewed the patients History and Physical, chart, labs and discussed the procedure including the risks, benefits and alternatives for the proposed anesthesia with the patient or authorized representative who has indicated his/her understanding and acceptance.      Plan Discussed with: Anesthesiologist, Surgeon and CRNA  Anesthesia Plan Comments: (Because patient has already had his cardiac issues worked up with cardiology 11/27/17 and was deemed low risk for hand surgery, because he has >4 mets without limitations, he is asymptomatic, has no ischemic cardiac disease, and because he is hemodynamically stable, I deem the patient low risk for cataract surgery today. )        Anesthesia Quick Evaluation

## 2017-12-24 NOTE — Transfer of Care (Signed)
Immediate Anesthesia Transfer of Care Note  Patient: Vincent [name]  Procedure(s) Performed: CATARACT EXTRACTION PHACO AND INTRAOCULAR LENS PLACEMENT ([place]) LEFT (Left Eye)  Patient Location: PACU  Anesthesia Type: MAC  Level of Consciousness: awake, alert  and patient cooperative  Airway and Oxygen Therapy: Patient Spontanous Breathing and Patient connected to supplemental oxygen  Post-op Assessment: Post-op Vital signs reviewed, Patient's Cardiovascular Status Stable, Respiratory Function Stable, Patent Airway and No signs of Nausea or vomiting  Post-op Vital Signs: Reviewed and stable  Complications: No apparent anesthesia complications

## 2017-12-25 ENCOUNTER — Encounter: Payer: Self-pay | Admitting: Ophthalmology

## 2017-12-27 ENCOUNTER — Encounter: Payer: Self-pay | Admitting: Internal Medicine

## 2017-12-27 ENCOUNTER — Ambulatory Visit (INDEPENDENT_AMBULATORY_CARE_PROVIDER_SITE_OTHER): Payer: Medicare Other | Admitting: Internal Medicine

## 2017-12-27 VITALS — BP 104/80 | HR 57 | Ht 70.5 in | Wt 223.0 lb

## 2017-12-27 DIAGNOSIS — I480 Paroxysmal atrial fibrillation: Secondary | ICD-10-CM

## 2017-12-27 DIAGNOSIS — I471 Supraventricular tachycardia, unspecified: Secondary | ICD-10-CM

## 2017-12-27 NOTE — Progress Notes (Signed)
ELECTROPHYSIOLOGY CONSULT NOTE  Patient ID: Vincent [name], MRN: [id number], DOB/AGE: 1941/05/26 77 y.o. Admit date: (Not on file) Date of Consult: 12/27/2017  Primary Physician: [name], MD Primary Cardiologist: MA     Arek Jatavius [name] is a 77 y.o. male who is being seen today for the evaluation of AFib  at the request of MA.    HPI Vincent [name] is a 77 y.o. male  Seen with a history of atrial fibrillation.  This was noted post operative for a ruptured appendix 2015.  Current documented tachycardia prompted the initiation of anticoagulation held as the atrial fibrillation was thought to be secondary.  I had seen him prior to 2013 with a history of documented SVT as well as PVCs.  Neither was symptomatic or excessively burdensome in terms of volume so medical therapy was recommended and he has been following with Dr. Kennith Maes  He has had recurrent episodes of tachycardia; Holter monitoring and ECGs from the ER all demonstrate a narrow QRS tachycardia with an R prime in lead V1 consistent with AV nodal reentry.  These episodes are abrupt in onset and offset and are unassociated with any significant symptoms of lightheadedness chest pain breath fatigue.  He notes no specific triggers.  He plays golf regularly.  He denies chest pain, exercise tolerance.  He has had no bleeding on Xarelto    DATE TEST    9/13 Myoview   EF 60 % No ischemia  8/15 Echo    EF 50-55 %   12/18 Echo  EF 40-45%      Event recorder 2015 intermittently frequent PVCs Holter monitor 12/18 PVCs-6%; it also demonstrated narrow QRS regular tachycardia    Past Medical History:  Diagnosis Date  . Arthritis   . Dizziness and giddiness   . Dysrhythmia   . GERD (gastroesophageal reflux disease)   . History of NICM    a. Prev EF as low as 35-40%;  b. 03/2012 Echo: EF 50-55%, Gr 1 DD, mild MR;  c. 06/2014 Echo: EF 50-55%, gr 1 DD, mild MR, mildly dil LA.  Vincent [name] History of systolic CHF    a. Prev EF as low as 35-40%;  b. 03/2012 Echo: EF 50-55%, Gr 1 DD, mild MR;  c. 06/2014 Echo: EF 50-55%, gr 1 DD, mild MR, mildly dil LA.  Vincent [name] Hypothyroidism   . Non-obstructive CAD    a. 2009 nonobs dzs, LAD 20, EF 40-45%;  b. 07/2012 Ex MV: EF 55%, freq pvc's, no ischemia/infarct.  Vincent [name] PAF (paroxysmal atrial fibrillation) (HCC)    a. 11/2013 - occurred 3 wks post-op Appe in setting of pelvic abscess-->amio-->converted but amio later d/c'd 2/2 hypothyroidism.  . Paroxysmal supraventricular tachycardia (HCC)    a. SVT/PAT  . Prostate enlargement   . PSVT (paroxysmal supraventricular tachycardia) (HCC)    a. 11/2013.  Vincent [name] PVC's (premature ventricular contractions)    a. Asymptomatic - managed with Toprol and Mg.  . Sciatica of right side       Surgical History:  Past Surgical History:  Procedure Laterality Date  . ABSCESS DRAINAGE    . APPENDECTOMY  2015  . CARDIAC CATHETERIZATION  2009  . CATARACT EXTRACTION Right   . CATARACT EXTRACTION W/PHACO Right 10/17/2017   Procedure: CATARACT EXTRACTION PHACO AND INTRAOCULAR LENS PLACEMENT ([facility]) RIGHT;  Surgeon: [name], MD;  Location: [facility];  Service: Ophthalmology;  Laterality: Right;  . CATARACT EXTRACTION W/PHACO Left 12/24/2017   Procedure: CATARACT EXTRACTION  PHACO AND INTRAOCULAR LENS PLACEMENT ([facility]) LEFT;  Surgeon: [name], MD;  Location: [facility];  Service: Ophthalmology;  Laterality: Left;  . 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 HAND SURGERY  1999  . PARTIAL KNEE ARTHROPLASTY Left   . TOTAL KNEE ARTHROPLASTY Right 06/17/2017     Home Meds: Prior to Admission medications   Medication Sig Start Date End Date Taking? Authorizing Provider  acetaminophen (TYLENOL) 500 MG tablet Take 1,000 mg by mouth daily as needed for  moderate pain or headache.   Yes [provider]  amoxicillin (AMOXIL) 500 MG tablet Take 2,000 mg as needed by mouth (before dental procedures).   Yes [provider]  BIOTIN PO Take 1 tablet by mouth daily.   Yes [provider]  Carboxymethylcellul-Glycerin (LUBRICATING EYE DROPS OP) Apply 1 drop to eye 2 (two) times daily as needed (dry eyes).   Yes [provider]  diclofenac (VOLTAREN) 75 MG EC tablet TAKE 1 TABLET(S) TWICE A DAY BY ORAL ROUTE. 08/04/15  Yes [provider]  dutasteride (AVODART) 0.5 MG capsule Take 0.5 mg by mouth every other day.   Yes [provider]  gabapentin (NEURONTIN) 300 MG capsule Take 300 mg by mouth 4 (four) times daily.   Yes [provider]  HYDROcodone-acetaminophen (NORCO) 7.5-325 MG tablet Take 1 tablet by mouth every 6 (six) hours as needed for moderate pain. 11/28/17  Yes [name], MD  levothyroxine (SYNTHROID, LEVOTHROID) 25 MCG tablet Take 25 mcg by mouth daily before breakfast.   Yes [provider]  Magnesium Oxide 400 (240 MG) MG TABS Take 1 tablet by mouth 2 (two) times daily. 06/20/12  Yes Iran Ouch, MD  metoprolol succinate (TOPROL-XL) 25 MG 24 hr tablet TAKE 1 TABLET DAILY Patient taking differently: qhs 03/12/17  Yes Iran Ouch, MD  Omega-3 Fatty Acids (FISH OIL) 1200 MG CAPS Take 1,200 mg by mouth 2 (two) times daily.   Yes [provider]  omeprazole (PRILOSEC) 20 MG capsule Take 20 mg by mouth daily.     Yes [provider]  rivaroxaban (XARELTO) 20 MG TABS tablet Take 1 tablet (20 mg total) by mouth daily with supper. 10/30/17  Yes Iran Ouch, MD  tamsulosin (FLOMAX) 0.4 MG CAPS capsule Take 1 capsule (0.4 mg total) by mouth daily. 12/07/17  Yes Stoioff, Verna Czech, MD    Allergies: No Known Allergies  Social History   Socioeconomic History  . Marital status: Married    Spouse name: Not on file  . Number of children: Not on file    . Years of education: Not on file  . Highest education level: Not on file  Social Needs  . Financial resource strain: Not on file  . Food insecurity - worry: Not on file  . Food insecurity - inability: Not on file  . Transportation needs - medical: Not on file  . Transportation needs - non-medical: Not on file  Occupational History  . Not on file  Tobacco Use  . Smoking status: Never Smoker  . Smokeless tobacco: Never Used  . Tobacco comment: tobacco use - no  Substance and Sexual Activity  . Alcohol use: No  . Drug use: No  . Sexual activity: Not on file  Other Topics Concern  . Not on file  Social  History Narrative   Retired, married, gets regular exercise.     Family History  Problem Relation Age of Onset  . Heart attack Father 19  . Coronary artery disease Unknown        family hx  . Heart failure Unknown        family hx - CHF     ROS:  Please see the history of present illness.     All other systems reviewed and negative.    Physical Exam: Blood pressure 104/80, pulse (!) 57, height 5' 10.5" (1.791 m), weight 223 lb (101.2 kg). General: Well developed, well nourished male in no acute distress. Head: Normocephalic, atraumatic, sclera non-icteric, no xanthomas, nares are without discharge. EENT: normal  Lymph Nodes:  none Neck: Negative for carotid bruits. JVD not elevated. Back:without scoliosis kyphosis Lungs: Clear bilaterally to auscultation without wheezes, rales, or rhonchi. Breathing is unlabored. Heart: RRR with S1 S2. No  murmur . No rubs, or gallops appreciated. Abdomen: Soft, non-tender, non-distended with normoactive bowel sounds. No hepatomegaly. No rebound/guarding. No obvious abdominal masses. Msk:  Strength and tone appear normal for age. Extremities: No clubbing or cyanosis. No  edema.  Distal pedal pulses are 2+ and equal bilaterally. Skin: Warm and Dry Neuro: Alert and oriented X 3. CN III-XII intact Grossly normal sensory and motor function  . Psych:  Responds to questions appropriately with a normal affect.      Labs: Cardiac Enzymes No results for input(s): CKTOTAL, CKMB, TROPONINI in the last 72 hours. CBC Lab Results  Component Value Date   WBC 5.3 11/22/2017   HGB 14.4 11/22/2017   HCT 43.0 11/22/2017   MCV 92.0 11/22/2017   PLT 207 11/22/2017   PROTIME: No results for input(s): LABPROT, INR in the last 72 hours. Chemistry No results for input(s): NA, K, CL, CO2, BUN, CREATININE, CALCIUM, PROT, BILITOT, ALKPHOS, ALT, AST, GLUCOSE in the last 168 hours.  Invalid input(s): LABALBU Lipids Lab Results  Component Value Date   CHOL 218 10/25/2009   HDL 37.3 10/25/2009   LDLCALC 142.9 10/25/2009   TRIG 189 10/25/2009   BNP No results found for: PROBNP Thyroid Function Tests: No results for input(s): TSH, T4TOTAL, T3FREE, THYROIDAB in the last 72 hours.  Invalid input(s): FREET3 Miscellaneous No results found for: DDIMER  Radiology/Studies:  No results found.  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  Recent data has corroborated the concerns related to its distinction from typical atrial fibrillation both in terms of bleeding risks as well as stroke risks.  Anticoagulation I do not think is indicated.  As all of his subsequent tachycardia that has been documented his SVT and not atrial fibrillation I have suggested that he discontinue his Xarelto A.  He asked about going back on aspirin; I have suggested that he not do that either.   As relates to his SVT, it is associated with few symptoms and hence, he is not interested in pursuing definitive ablation therapy at this time.  I think that that is reasonable.  In the event that symptoms worsen or becomes increasingly problematic based on the frequency of the events albeit a  associated with few symptoms could be reconsidered.  He would like to follow-up with Dr. Kennith Maes and I am glad to see him as needed   Sherryl Manges

## 2017-12-27 NOTE — Patient Instructions (Addendum)
Medication Instructions:  Your physician has recommended you make the following change in your medication:   Stop Xarelto   Labwork: None ordered.  Testing/Procedures: None ordered.  Follow-Up: Your physician recommends that you schedule a follow-up appointment with Dr Kirke Corin in 3-4 months. Follow up with Dr [name] as needed.   Any Other Special Instructions Will Be Listed Below (If Applicable).     If you need a refill on your cardiac medications before your next appointment, please call your pharmacy.

## 2018-01-01 ENCOUNTER — Telehealth: Payer: Self-pay | Admitting: Cardiovascular Disease

## 2018-01-01 NOTE — Telephone Encounter (Signed)
Received EMSI request .  Patient aware sent forms to [employer] (interoffice mail) and an information packet with consent mailed to patient

## 2018-03-01 ENCOUNTER — Ambulatory Visit (INDEPENDENT_AMBULATORY_CARE_PROVIDER_SITE_OTHER): Payer: Medicare Other | Admitting: Cardiovascular Disease

## 2018-03-01 ENCOUNTER — Encounter: Payer: Self-pay | Admitting: Cardiovascular Disease

## 2018-03-01 VITALS — BP 110/78 | HR 67 | Ht 70.5 in | Wt 222.2 lb

## 2018-03-01 DIAGNOSIS — I428 Other cardiomyopathies: Secondary | ICD-10-CM | POA: Diagnosis not present

## 2018-03-01 DIAGNOSIS — I471 Supraventricular tachycardia: Secondary | ICD-10-CM | POA: Diagnosis not present

## 2018-03-01 DIAGNOSIS — I493 Ventricular premature depolarization: Secondary | ICD-10-CM | POA: Diagnosis not present

## 2018-03-01 MED ORDER — METOPROLOL SUCCINATE ER 50 MG PO TB24: 50.0000 mg | ORAL_TABLET | Freq: Every day | ORAL | 3 refills | Status: DC

## 2018-03-01 NOTE — Progress Notes (Signed)
Cardiology Office Note   Date:  03/01/2018   ID:  Vincent [name], DOB Apr 27, 1941, MRN [id number]  PCP:  [name], MD  Cardiologist:   [name], MD   Chief Complaint  Patient presents with  . OTHER    3 month f/u c/o rapid heart beat. Meds reviewed verbally with pt.      History of Present Illness: Vincent [name] is a 77 y.o. male who presents for a follow up regarding supraventricular tachycardia, mild cardiomyopathy, postoperative atrial fibrillation and ventricular bigeminy.  Catheterization from 2009 showed an ejection fraction of 40-45% range with a mild nonobstructive 20% plaque in his LAD.  PVCs have been treated with metoprolol and magnesium. Most recent nuclear stress test in 2013 was normal.  Echocardiogram in December showed an EF of 45-50%, mild biatrial enlargement. The patient had worsening tachycardia recently and I referred him to Dr. [name].  He felt that the episodes were due to SVT and unlikely to be due to A. fib.  Xarelto was discontinued.  Ablation was discussed but it was felt that his symptoms were overall mild.  He is doing reasonably well although he continues to have intermittent tachycardia most notably at night. No chest pain or shortness of breath.  Past Medical History:  Diagnosis Date  . Arthritis   . Dizziness and giddiness   . Dysrhythmia   . GERD (gastroesophageal reflux disease)   . History of NICM    a. Prev EF as low as 35-40%;  b. 03/2012 Echo: EF 50-55%, Gr 1 DD, mild MR;  c. 06/2014 Echo: EF 50-55%, gr 1 DD, mild MR, mildly dil LA.  [name] History of systolic CHF    a. Prev EF as low as 35-40%;  b. 03/2012 Echo: EF 50-55%, Gr 1 DD, mild MR;  c. 06/2014 Echo: EF 50-55%, gr 1 DD, mild MR, mildly dil LA.  [name] Hypothyroidism   . Non-obstructive CAD    a. 2009 nonobs dzs, LAD 20, EF 40-45%;  b. 07/2012 Ex MV: EF 55%, freq pvc's, no ischemia/infarct.  [name] PAF (paroxysmal atrial fibrillation) (HCC)    a. 11/2013 - occurred 3 wks  post-op Appe in setting of pelvic abscess-->amio-->converted but amio later d/c'd 2/2 hypothyroidism.  . Paroxysmal supraventricular tachycardia (HCC)    a. SVT/PAT  . Prostate enlargement   . PSVT (paroxysmal supraventricular tachycardia) (HCC)    a. 11/2013.  [name] PVC's (premature ventricular contractions)    a. Asymptomatic - managed with Toprol and Mg.  . Sciatica of right side     Past Surgical History:  Procedure Laterality Date  . ABSCESS DRAINAGE    . APPENDECTOMY  2015  . CARDIAC CATHETERIZATION  2009  . CATARACT EXTRACTION Right   . CATARACT EXTRACTION W/PHACO Right 10/17/2017   Procedure: CATARACT EXTRACTION PHACO AND INTRAOCULAR LENS PLACEMENT ([facility]) RIGHT;  Surgeon: [name], MD;  Location: [facility];  Service: Ophthalmology;  Laterality: Right;  . CATARACT EXTRACTION W/PHACO Left 12/24/2017   Procedure: CATARACT EXTRACTION PHACO AND INTRAOCULAR LENS PLACEMENT ([facility]) LEFT;  Surgeon: [name], MD;  Location: [facility];  Service: Ophthalmology;  Laterality: Left;  . 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 HAND SURGERY  1999  . PARTIAL KNEE ARTHROPLASTY Left   . TOTAL KNEE ARTHROPLASTY Right 06/17/2017     Current  Outpatient Medications  Medication Sig Dispense Refill  . acetaminophen (TYLENOL) 500 MG tablet Take 1,000 mg by mouth daily as needed for moderate pain or headache.    [name] amoxicillin (AMOXIL) 500 MG tablet Take 2,000 mg as needed by mouth (before dental procedures).    [name] BIOTIN PO Take 1 tablet by mouth daily.    . Carboxymethylcellul-Glycerin (LUBRICATING EYE DROPS OP) Apply 1 drop to eye 2 (two) times daily as needed (dry eyes).    [name] diclofenac (VOLTAREN) 75 MG EC tablet TAKE 1 TABLET(S) TWICE A DAY BY ORAL ROUTE.    [name] dutasteride (AVODART) 0.5 MG  capsule Take 0.5 mg by mouth every other day.    . gabapentin (NEURONTIN) 300 MG capsule Take 300 mg by mouth 4 (four) times daily.    [name] HYDROcodone-acetaminophen (NORCO) 7.5-325 MG tablet Take 1 tablet by mouth every 6 (six) hours as needed for moderate pain. 50 tablet 0  . levothyroxine (SYNTHROID, LEVOTHROID) 25 MCG tablet Take 25 mcg by mouth daily before breakfast.    . Magnesium Oxide 400 (240 MG) MG TABS Take 1 tablet by mouth 2 (two) times daily. 60 tablet 6  . metoprolol succinate (TOPROL-XL) 25 MG 24 hr tablet TAKE 1 TABLET DAILY (Patient taking differently: qhs) 90 tablet 3  . Omega-3 Fatty Acids (FISH OIL) 1200 MG CAPS Take 1,200 mg by mouth 2 (two) times daily.    [name] omeprazole (PRILOSEC) 20 MG capsule Take 20 mg by mouth daily.      . tamsulosin (FLOMAX) 0.4 MG CAPS capsule Take 1 capsule (0.4 mg total) by mouth daily. 90 capsule 3   No current facility-administered medications for this visit.     Allergies:   Patient has no known allergies.    Social History:  The patient  reports that he has never smoked. He has never used smokeless tobacco. He reports that he does not drink alcohol or use drugs.   Family History:  The patient's family history includes Coronary artery disease in his unknown relative; Heart attack (age of onset: 71) in his father; Heart failure in his unknown relative.    ROS:  Please see the history of present illness.   Otherwise, review of systems are positive for none.   All other systems are reviewed and negative.    PHYSICAL EXAM: VS:  BP 110/78 (BP Location: Left Arm, Patient Position: Sitting, Cuff Size: Normal)   Pulse 67   Ht 5' 10.5" (1.791 m)   Wt 222 lb 4 oz (100.8 kg)   BMI 31.44 kg/m  , BMI Body mass index is 31.44 kg/m. GEN: Well nourished, well developed, in no acute distress  HEENT: normal  Neck: no JVD, carotid bruits, or masses Cardiac: RRR with frequent premature beats; no murmurs, rubs, or gallops,no edema  Respiratory:  clear  to auscultation bilaterally, normal work of breathing GI: soft, nontender, nondistended, + BS MS: no deformity or atrophy  Skin: warm and dry, no rash Neuro:  Strength and sensation are intact Psych: euthymic mood, full affect   EKG:  EKG is ordered today. The ekg ordered today demonstrates normal sinus rhythm with frequent PVCs   Recent Labs: 11/22/2017: BUN 19; Creatinine, Ser 1.06; Hemoglobin 14.4; Magnesium 2.0; Platelets 207; Potassium 4.3; Sodium 136; TSH 2.082    Lipid Panel    Component Value Date/Time   CHOL 218 10/25/2009   TRIG 189 10/25/2009   HDL 37.3 10/25/2009   LDLCALC 142.9 10/25/2009      Wt  Readings from Last 3 Encounters:  03/01/18 222 lb 4 oz (100.8 kg)  12/27/17 223 lb (101.2 kg)  12/24/17 220 lb (99.8 kg)      No flowsheet data found.    ASSESSMENT AND PLAN:  1.  Paroxysmal supraventricular tachycardia: Symptoms seem to be stable although he continues to have recurrent tachycardia mostly at night.  I am going to increase Toprol to 50 mg once daily to see if he can tolerate this dose. If symptoms persist, ablation is planned by Dr. [name].  2. Frequent PVCs: I increase Toprol as outlined above.  He continues to have frequent PVCs and has mild nonischemic cardiomyopathy.  This might have to be addressed as well at some point if no improvement with medications.  3. History of nonischemic cardiomyopathy: Most recent echo showed mildly reduced EF.  Continue Toprol.  Blood pressure is low and does not allow the addition of an ACE inhibitor or ARB.   Disposition: Follow-up with me in 3 months.  Signed,  [name], MD  03/01/2018 8:34 AM    [facility]

## 2018-03-01 NOTE — Patient Instructions (Signed)
Medication Instructions:  Your physician has recommended you make the following change in your medication:  INCREASE metoprolol to 50mg  once daily     Labwork: none  Testing/Procedures: none  Follow-Up: Your physician recommends that you schedule a follow-up appointment in: 3 months with Dr. Kirke Corin.    Any Other Special Instructions Will Be Listed Below (If Applicable).     If you need a refill on your cardiac medications before your next appointment, please call your pharmacy.

## 2018-03-29 ENCOUNTER — Ambulatory Visit: Payer: Medicare Other | Admitting: Cardiovascular Disease

## 2018-04-04 ENCOUNTER — Ambulatory Visit: Payer: Medicare Other | Admitting: Internal Medicine

## 2018-06-05 NOTE — Progress Notes (Signed)
Cardiology Office Note   Date:  06/06/2018   ID:  Vincent [name], DOB [birth year], MRN [id number]  PCP:  [name], MD  Cardiologist:   [name], MD   Chief Complaint  Patient presents with  . other    3 month f/u no complaints today. Meds reviewed verbally with pt.      History of Present Illness: Vincent [name] is a 77 y.o. male who presents for a follow up regarding supraventricular tachycardia, mild cardiomyopathy, postoperative atrial fibrillation and ventricular bigeminy.  Catheterization from 2009 showed an ejection fraction of 40-45% range with a mild nonobstructive 20% plaque in his LAD.  Most recent nuclear stress test in 2013 was normal.  Echocardiogram in December, 2018 showed an EF of 45-50%, mild biatrial enlargement. The patient had worsening tachycardia 6 months ago and I referred him to Dr. [name].  He felt that the episodes were due to SVT and unlikely to be due to A. fib.  Xarelto was discontinued.  Ablation was discussed but it was felt that his symptoms were overall mild.   During last visit, I increased Toprol to 50 mg once daily.  Tachycardia has improved although he continues to have intermittent episodes mostly at night.  Usually these episodes are self-limiting and he does not seem to be significantly symptomatic.  His blood pressure at home runs around 100 to 110 mmHg systolic.  He is mildly hypotensive today but overall is asymptomatic.  No dizziness.  Past Medical History:  Diagnosis Date  . Arthritis   . Dizziness and giddiness   . Dysrhythmia   . GERD (gastroesophageal reflux disease)   . History of NICM    a. Prev EF as low as 35-40%;  b. 03/2012 Echo: EF 50-55%, Gr 1 DD, mild MR;  c. 06/2014 Echo: EF 50-55%, gr 1 DD, mild MR, mildly dil LA.  [name] History of systolic CHF    a. Prev EF as low as 35-40%;  b. 03/2012 Echo: EF 50-55%, Gr 1 DD, mild MR;  c. 06/2014 Echo: EF 50-55%, gr 1 DD, mild MR, mildly dil LA.  [name] Hypothyroidism     . Non-obstructive CAD    a. 2009 nonobs dzs, LAD 20, EF 40-45%;  b. 07/2012 Ex MV: EF 55%, freq pvc's, no ischemia/infarct.  [name] PAF (paroxysmal atrial fibrillation) (HCC)    a. 11/2013 - occurred 3 wks post-op Appe in setting of pelvic abscess-->amio-->converted but amio later d/c'd 2/2 hypothyroidism.  . Paroxysmal supraventricular tachycardia (HCC)    a. SVT/PAT  . Prostate enlargement   . PSVT (paroxysmal supraventricular tachycardia) (HCC)    a. 11/2013.  [name] PVC's (premature ventricular contractions)    a. Asymptomatic - managed with Toprol and Mg.  . Sciatica of right side     Past Surgical History:  Procedure Laterality Date  . ABSCESS DRAINAGE    . APPENDECTOMY  2015  . CARDIAC CATHETERIZATION  2009  . CATARACT EXTRACTION Right   . CATARACT EXTRACTION W/PHACO Right 10/17/2017   Procedure: CATARACT EXTRACTION PHACO AND INTRAOCULAR LENS PLACEMENT (IOC) RIGHT;  Surgeon: [name], MD;  Location: [facility];  Service: Ophthalmology;  Laterality: Right;  . CATARACT EXTRACTION W/PHACO Left 12/24/2017   Procedure: CATARACT EXTRACTION PHACO AND INTRAOCULAR LENS PLACEMENT (IOC) LEFT;  Surgeon: [name], MD;  Location: [facility];  Service: Ophthalmology;  Laterality: Left;  . 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 HAND SURGERY  1999  . PARTIAL KNEE ARTHROPLASTY Left   . TOTAL KNEE ARTHROPLASTY Right 06/17/2017     Current Outpatient Medications  Medication Sig Dispense Refill  . acetaminophen (TYLENOL) 500 MG tablet Take 1,000 mg by mouth daily as needed for moderate pain or headache.    [name] amoxicillin (AMOXIL) 500 MG tablet Take 2,000 mg as needed by mouth (before dental procedures).    [name] BIOTIN PO Take 1 tablet by mouth daily.    . Carboxymethylcellul-Glycerin (LUBRICATING  EYE DROPS OP) Apply 1 drop to eye 2 (two) times daily as needed (dry eyes).    [name] diclofenac (VOLTAREN) 75 MG EC tablet TAKE 1 TABLET(S) TWICE A DAY BY ORAL ROUTE.    [name] dutasteride (AVODART) 0.5 MG capsule Take 0.5 mg by mouth every other day.    . gabapentin (NEURONTIN) 300 MG capsule Take 300 mg by mouth 4 (four) times daily.    [name] HYDROcodone-acetaminophen (NORCO) 7.5-325 MG tablet Take 1 tablet by mouth every 6 (six) hours as needed for moderate pain. 50 tablet 0  . levothyroxine (SYNTHROID, LEVOTHROID) 25 MCG tablet Take 25 mcg by mouth daily before breakfast.    . Magnesium Oxide 400 (240 MG) MG TABS Take 1 tablet by mouth 2 (two) times daily. 60 tablet 6  . metoprolol succinate (TOPROL-XL) 50 MG 24 hr tablet Take 1 tablet (50 mg total) by mouth daily. Take with or immediately following a meal. 90 tablet 3  . Omega-3 Fatty Acids (FISH OIL) 1200 MG CAPS Take 1,200 mg by mouth 2 (two) times daily.    [name] omeprazole (PRILOSEC) 20 MG capsule Take 20 mg by mouth daily.      . tamsulosin (FLOMAX) 0.4 MG CAPS capsule Take 1 capsule (0.4 mg total) by mouth daily. 90 capsule 3   No current facility-administered medications for this visit.     Allergies:   Patient has no known allergies.    Social History:  The patient  reports that he has never smoked. He has never used smokeless tobacco. He reports that he does not drink alcohol or use drugs.   Family History:  The patient's family history includes Coronary artery disease in his unknown relative; Heart attack (age of onset: 66) in his father; Heart failure in his unknown relative.    ROS:  Please see the history of present illness.   Otherwise, review of systems are positive for none.   All other systems are reviewed and negative.    PHYSICAL EXAM: VS:  BP (!) 84/58 (BP Location: Left Arm, Patient Position: Sitting, Cuff Size: Normal)   Pulse 69   Ht 5' 10.5" (1.791 m)   Wt 213 lb 12 oz (97 kg)   BMI 30.24 kg/m  , BMI Body mass index is  30.24 kg/m. GEN: Well nourished, well developed, in no acute distress  HEENT: normal  Neck: no JVD, carotid bruits, or masses Cardiac: RRR ; no murmurs, rubs, or gallops,no edema  Respiratory:  clear to auscultation bilaterally, normal work of breathing GI: soft, nontender, nondistended, + BS MS: no deformity or atrophy  Skin: warm and dry, no rash Neuro:  Strength and sensation are intact Psych: euthymic mood, full affect   EKG:  EKG is ordered today. The ekg ordered today demonstrates normal sinus rhythm with low voltage.   Recent Labs: 11/22/2017: BUN 19; Creatinine, Ser 1.06; Hemoglobin 14.4; Magnesium 2.0;  Platelets 207; Potassium 4.3; Sodium 136; TSH 2.082    Lipid Panel    Component Value Date/Time   CHOL 218 10/25/2009   TRIG 189 10/25/2009   HDL 37.3 10/25/2009   LDLCALC 142.9 10/25/2009      Wt Readings from Last 3 Encounters:  06/06/18 213 lb 12 oz (97 kg)  03/01/18 222 lb 4 oz (100.8 kg)  12/27/17 223 lb (101.2 kg)      No flowsheet data found.    ASSESSMENT AND PLAN:  1.  Paroxysmal supraventricular tachycardia: His symptoms improved after increasing the dose of Toprol to 50 mg once daily.  2. Frequent PVCs: No PVCs noted today on EKG and by physical exam.  3. History of nonischemic cardiomyopathy: Most recent echo showed mildly reduced EF.  Continue Toprol.  Blood pressure is low and does not allow the addition of an ACE inhibitor or ARB.  Blood pressure is 84/58 but he is asymptomatic and blood pressure runs better at home.  I made no changes today.   Disposition: Follow-up with me in 6 months.  Signed,  [name], MD  06/06/2018 8:13 AM    [facility]

## 2018-06-06 ENCOUNTER — Ambulatory Visit (INDEPENDENT_AMBULATORY_CARE_PROVIDER_SITE_OTHER): Payer: Medicare Other | Admitting: Cardiovascular Disease

## 2018-06-06 ENCOUNTER — Encounter: Payer: Self-pay | Admitting: Cardiovascular Disease

## 2018-06-06 VITALS — BP 84/58 | HR 69 | Ht 70.5 in | Wt 213.8 lb

## 2018-06-06 DIAGNOSIS — I493 Ventricular premature depolarization: Secondary | ICD-10-CM | POA: Diagnosis not present

## 2018-06-06 DIAGNOSIS — I428 Other cardiomyopathies: Secondary | ICD-10-CM

## 2018-06-06 DIAGNOSIS — I471 Supraventricular tachycardia: Secondary | ICD-10-CM

## 2018-06-06 NOTE — Patient Instructions (Signed)
Medication Instructions: Continue same medications.   Labwork: None.   Procedures/Testing: None.   Follow-Up: 6 months with Dr. Arida.   Any Additional Special Instructions Will Be Listed Below (If Applicable).     If you need a refill on your cardiac medications before your next appointment, please call your pharmacy.   

## 2018-07-01 ENCOUNTER — Other Ambulatory Visit: Payer: Self-pay | Admitting: Urology

## 2018-07-01 DIAGNOSIS — R350 Frequency of micturition: Principal | ICD-10-CM

## 2018-07-01 DIAGNOSIS — N401 Enlarged prostate with lower urinary tract symptoms: Secondary | ICD-10-CM

## 2018-07-01 DIAGNOSIS — R972 Elevated prostate specific antigen [PSA]: Secondary | ICD-10-CM

## 2018-07-01 MED ORDER — DUTASTERIDE 0.5 MG PO CAPS: 0.5000 mg | ORAL_CAPSULE | ORAL | 3 refills | Status: DC

## 2018-07-01 NOTE — Telephone Encounter (Signed)
Patient is calling requesting a prescription for dutasteride 0.5mg  capsule to be sent to [facility].  Please call him with any concerns.

## 2018-07-30 ENCOUNTER — Other Ambulatory Visit: Payer: Self-pay | Admitting: Family Medicine

## 2018-07-30 DIAGNOSIS — R972 Elevated prostate specific antigen [PSA]: Secondary | ICD-10-CM

## 2018-07-30 DIAGNOSIS — N401 Enlarged prostate with lower urinary tract symptoms: Secondary | ICD-10-CM

## 2018-07-30 DIAGNOSIS — R350 Frequency of micturition: Principal | ICD-10-CM

## 2018-07-30 MED ORDER — DUTASTERIDE 0.5 MG PO CAPS: 0.5000 mg | ORAL_CAPSULE | ORAL | 3 refills | Status: DC

## 2018-09-03 DIAGNOSIS — M5431 Sciatica, right side: Secondary | ICD-10-CM | POA: Insufficient documentation

## 2018-09-19 ENCOUNTER — Telehealth: Payer: Self-pay | Admitting: Cardiovascular Disease

## 2018-09-19 NOTE — Telephone Encounter (Signed)
   Primary Cardiologist:Muhammad Kirke Corin, MD  Chart reviewed as part of pre-operative protocol coverage. Left voice mail to call between pre-op hours.   [place], [place]  09/19/2018, 3:21 PM

## 2018-09-19 NOTE — Telephone Encounter (Signed)
° °  [facility] Pre-operative Risk Assessment    Request for surgical clearance:  1. What type of surgery is being performed? Colonoscopy   2. When is this surgery scheduled? 11/06/18   3. What type of clearance is required (medical clearance vs. Pharmacy clearance to hold med vs. Both)? Medical   4. Are there any medications that need to be held prior to surgery and how long?   5. Practice name and name of physician performing surgery? [facility] Gastro Dr Gustavo Lah    6. What is your office phone number    7.   What is your office fax number [phone]  8.   Anesthesia type (None, local, MAC, general) ? Monitored

## 2018-09-25 NOTE — Telephone Encounter (Signed)
   Primary Cardiologist: [name], MD  Chart reviewed as part of pre-operative protocol coverage. Patient is followed in [place] and our preop APP pool does not presently handle satellite clearances, only [address] and [place] patients. Will route back to [facility] triage to manage (and M. Broome as Lorain Childes). I will remove from pre-op APP pool.  Laurann Montana, PA-C 09/25/2018, 1:58 PM

## 2018-09-25 NOTE — Telephone Encounter (Signed)
Low risk

## 2018-09-25 NOTE — Telephone Encounter (Signed)
Routing to Dr Arida.  

## 2018-09-25 NOTE — Telephone Encounter (Signed)
Routed to number provided via EPIC fax.  

## 2018-10-03 ENCOUNTER — Other Ambulatory Visit: Payer: Self-pay

## 2018-10-03 ENCOUNTER — Encounter: Payer: Self-pay | Admitting: Urology

## 2018-10-03 ENCOUNTER — Ambulatory Visit (INDEPENDENT_AMBULATORY_CARE_PROVIDER_SITE_OTHER): Payer: Medicare Other | Admitting: Urology

## 2018-10-03 VITALS — BP 113/76 | HR 70 | Wt 221.0 lb

## 2018-10-03 DIAGNOSIS — N401 Enlarged prostate with lower urinary tract symptoms: Secondary | ICD-10-CM

## 2018-10-03 DIAGNOSIS — R972 Elevated prostate specific antigen [PSA]: Secondary | ICD-10-CM | POA: Diagnosis not present

## 2018-10-03 MED ORDER — MIRABEGRON ER 50 MG PO TB24: 50.0000 mg | ORAL_TABLET | Freq: Every day | ORAL | 11 refills | Status: DC

## 2018-10-03 NOTE — Progress Notes (Signed)
10/03/2018 1:52 PM   Vincent [name] [birth year] [id number]  Referring provider: [name], MD [address] [facility] [place], [place] [address]  Chief Complaint  Patient presents with  . Benign Prostatic Hypertrophy   Urologic history: 1.  BPH with lower urinary tract symptoms -On dutasteride several years taking every other day -Tamsulosin added last year for increased urgency/nocturia  2.  Elevated PSA -Prostate biopsy early 2000 PSA 6.1; benign -Rebiopsy 2006; PSA 6.6; benign -Started on dutasteride with baseline PSA low-mid 2 range.   HPI: 77 year old male presents for annual follow-up.  He was started on tamsulosin last year for urgency and nocturia.  He states his nocturia has improved but still has urgency.  Denies dysuria or gross hematuria.  Denies flank, abdominal, pelvic or scrotal pain.   PMH: Past Medical History:  Diagnosis Date  . Arthritis   . Dizziness and giddiness   . Dysrhythmia   . GERD (gastroesophageal reflux disease)   . History of NICM    a. Prev EF as low as 35-40%;  b. 03/2012 Echo: EF 50-55%, Gr 1 DD, mild MR;  c. 06/2014 Echo: EF 50-55%, gr 1 DD, mild MR, mildly dil LA.  [name] History of systolic CHF    a. Prev EF as low as 35-40%;  b. 03/2012 Echo: EF 50-55%, Gr 1 DD, mild MR;  c. 06/2014 Echo: EF 50-55%, gr 1 DD, mild MR, mildly dil LA.  [name] Hypothyroidism   . Non-obstructive CAD    a. 2009 nonobs dzs, LAD 20, EF 40-45%;  b. 07/2012 Ex MV: EF 55%, freq pvc's, no ischemia/infarct.  [name] PAF (paroxysmal atrial fibrillation) (HCC)    a. 11/2013 - occurred 3 wks post-op Appe in setting of pelvic abscess-->amio-->converted but amio later d/c'd 2/2 hypothyroidism.  . Paroxysmal supraventricular tachycardia (HCC)    a. SVT/PAT  . Prostate enlargement   . PSVT (paroxysmal supraventricular tachycardia) (HCC)    a. 11/2013.  [name] PVC's (premature ventricular contractions)    a. Asymptomatic - managed with Toprol and Mg.  . Sciatica  of right side     Surgical History: Past Surgical History:  Procedure Laterality Date  . ABSCESS DRAINAGE    . APPENDECTOMY  2015  . CARDIAC CATHETERIZATION  2009  . CATARACT EXTRACTION Right   . CATARACT EXTRACTION W/PHACO Right 10/17/2017   Procedure: CATARACT EXTRACTION PHACO AND INTRAOCULAR LENS PLACEMENT (IOC) RIGHT;  Surgeon: [name], MD;  Location: [facility];  Service: Ophthalmology;  Laterality: Right;  . CATARACT EXTRACTION W/PHACO Left 12/24/2017   Procedure: CATARACT EXTRACTION PHACO AND INTRAOCULAR LENS PLACEMENT (IOC) LEFT;  Surgeon: [name], MD;  Location: [facility];  Service: Ophthalmology;  Laterality: Left;  . 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 HAND SURGERY  1999  . PARTIAL KNEE ARTHROPLASTY Left   . TOTAL KNEE ARTHROPLASTY Right 06/17/2017    Home Medications:  Allergies as of 10/03/2018   No Known Allergies     Medication List        Accurate as of 10/03/18  1:52 PM. Always use your most recent med list.          acetaminophen 500 MG tablet Commonly known as:  TYLENOL Take 1,000 mg by mouth daily as needed for moderate pain or headache.   amoxicillin 500 MG tablet Commonly  known as:  AMOXIL Take 2,000 mg as needed by mouth (before dental procedures).   BIOTIN PO Take 1 tablet by mouth daily.   dutasteride 0.5 MG capsule Commonly known as:  AVODART Take 1 capsule (0.5 mg total) by mouth every other day.   Fish Oil 1200 MG Caps Take 1,200 mg by mouth 2 (two) times daily.   gabapentin 300 MG capsule Commonly known as:  NEURONTIN Take 300 mg by mouth 4 (four) times daily.   HYDROcodone-acetaminophen 7.5-325 MG tablet Commonly known as:  NORCO Take 1 tablet by mouth every 6 (six) hours as needed for moderate pain.     levothyroxine 25 MCG tablet Commonly known as:  SYNTHROID, LEVOTHROID Take 25 mcg by mouth daily before breakfast.   LUBRICATING EYE DROPS OP Apply 1 drop to eye 2 (two) times daily as needed (dry eyes).   Magnesium Oxide 400 (240 Mg) MG Tabs Take 1 tablet by mouth 2 (two) times daily.   metoprolol succinate 50 MG 24 hr tablet Commonly known as:  TOPROL-XL Take 1 tablet (50 mg total) by mouth daily. Take with or immediately following a meal.   omeprazole 20 MG capsule Commonly known as:  PRILOSEC Take 20 mg by mouth daily.   tamsulosin 0.4 MG Caps capsule Commonly known as:  FLOMAX Take 1 capsule (0.4 mg total) by mouth daily.       Allergies: No Known Allergies  Family History: Family History  Problem Relation Age of Onset  . Heart attack Father 24  . Coronary artery disease Unknown        family hx  . Heart failure Unknown        family hx - CHF    Social History:  reports that he has never smoked. He has never used smokeless tobacco. He reports that he does not drink alcohol or use drugs.  ROS: UROLOGY Frequent Urination?: No Hard to postpone urination?: Yes Burning/pain with urination?: No Get up at night to urinate?: No Leakage of urine?: No Urine stream starts and stops?: No Trouble starting stream?: No Do you have to strain to urinate?: No Blood in urine?: No Urinary tract infection?: No Sexually transmitted disease?: No Injury to kidneys or bladder?: No Painful intercourse?: No Weak stream?: No Erection problems?: No Penile pain?: No  Gastrointestinal Nausea?: No Vomiting?: No Indigestion/heartburn?: No Diarrhea?: No Constipation?: No  Constitutional Fever: No Night sweats?: Yes Weight loss?: No Fatigue?: No  Skin Skin rash/lesions?: No Itching?: No  Eyes Blurred vision?: No Double vision?: No  Ears/Nose/Throat Sore throat?: No Sinus problems?: No  Hematologic/Lymphatic Swollen glands?: No Easy bruising?:  Yes  Cardiovascular Leg swelling?: No Chest pain?: No  Respiratory Cough?: No Shortness of breath?: No  Endocrine Excessive thirst?: No  Musculoskeletal Back pain?: No Joint pain?: No  Neurological Headaches?: No Dizziness?: No  Psychologic Depression?: No Anxiety?: No  Physical Exam: BP 113/76   Pulse 70   Wt 221 lb (100.2 kg)   BMI 31.26 kg/m   Constitutional:  Alert and oriented, No acute distress. HEENT: [place] AT, moist mucus membranes.  Trachea midline, no masses. Cardiovascular: No clubbing, cyanosis, or edema. Respiratory: Normal respiratory effort, no increased work of breathing. Skin: No rashes, bruises or suspicious lesions. Neurologic: Grossly intact, no focal deficits, moving all 4 extremities. Psychiatric: Normal mood and affect.   Assessment & Plan:   He still has bothersome urgency and no significant benefit with tamsulosin.  Recommended he discontinue the tamsulosin.  He was given Myrbetriq  samples 50 mg.  Rx was sent to his pharmacy.  He will call back if he has persistent or worsening symptoms otherwise continue annual follow-up.  His uncorrected PSA has been stable for >9 years and based on his age have recommended discontinuing prostate cancer screening.  He was in agreement.   [name], MD  [facility] [address], [address] [place], [place] [address] [phone]

## 2018-11-05 ENCOUNTER — Encounter: Payer: Self-pay | Admitting: *Deleted

## 2018-11-06 ENCOUNTER — Encounter
Admission: RE | Disposition: A | Discharge status: Home / Self Care | Payer: Self-pay | Source: Home / Self Care | Attending: Internal Medicine

## 2018-11-06 ENCOUNTER — Other Ambulatory Visit: Payer: Self-pay

## 2018-11-06 ENCOUNTER — Ambulatory Visit: Payer: Medicare Other | Admitting: Anesthesiology

## 2018-11-06 ENCOUNTER — Ambulatory Visit
Admission: RE | Admit: 2018-11-06 | Discharge: 2018-11-06 | Disposition: A | Discharge status: Home / Self Care | Payer: Medicare Other | Attending: Internal Medicine | Admitting: Internal Medicine

## 2018-11-06 ENCOUNTER — Encounter: Payer: Self-pay | Admitting: *Deleted

## 2018-11-06 DIAGNOSIS — K219 Gastro-esophageal reflux disease without esophagitis: Secondary | ICD-10-CM | POA: Diagnosis not present

## 2018-11-06 DIAGNOSIS — I509 Heart failure, unspecified: Secondary | ICD-10-CM | POA: Diagnosis not present

## 2018-11-06 DIAGNOSIS — K5730 Diverticulosis of large intestine without perforation or abscess without bleeding: Secondary | ICD-10-CM | POA: Diagnosis not present

## 2018-11-06 DIAGNOSIS — I2510 Atherosclerotic heart disease of native coronary artery without angina pectoris: Secondary | ICD-10-CM | POA: Diagnosis not present

## 2018-11-06 DIAGNOSIS — K591 Functional diarrhea: Secondary | ICD-10-CM | POA: Diagnosis not present

## 2018-11-06 DIAGNOSIS — I080 Rheumatic disorders of both mitral and aortic valves: Secondary | ICD-10-CM | POA: Insufficient documentation

## 2018-11-06 DIAGNOSIS — E039 Hypothyroidism, unspecified: Secondary | ICD-10-CM | POA: Diagnosis not present

## 2018-11-06 DIAGNOSIS — I480 Paroxysmal atrial fibrillation: Secondary | ICD-10-CM | POA: Insufficient documentation

## 2018-11-06 DIAGNOSIS — Z79899 Other long term (current) drug therapy: Secondary | ICD-10-CM | POA: Diagnosis not present

## 2018-11-06 DIAGNOSIS — Z791 Long term (current) use of non-steroidal anti-inflammatories (NSAID): Secondary | ICD-10-CM | POA: Diagnosis not present

## 2018-11-06 DIAGNOSIS — M5431 Sciatica, right side: Secondary | ICD-10-CM | POA: Insufficient documentation

## 2018-11-06 DIAGNOSIS — Z86010 Personal history of colonic polyps: Secondary | ICD-10-CM | POA: Diagnosis not present

## 2018-11-06 DIAGNOSIS — M1990 Unspecified osteoarthritis, unspecified site: Secondary | ICD-10-CM | POA: Diagnosis not present

## 2018-11-06 DIAGNOSIS — I471 Supraventricular tachycardia: Secondary | ICD-10-CM | POA: Diagnosis not present

## 2018-11-06 DIAGNOSIS — M109 Gout, unspecified: Secondary | ICD-10-CM | POA: Insufficient documentation

## 2018-11-06 DIAGNOSIS — K641 Second degree hemorrhoids: Secondary | ICD-10-CM | POA: Insufficient documentation

## 2018-11-06 DIAGNOSIS — N400 Enlarged prostate without lower urinary tract symptoms: Secondary | ICD-10-CM | POA: Insufficient documentation

## 2018-11-06 HISTORY — PX: COLONOSCOPY WITH PROPOFOL: SHX5780

## 2018-11-06 HISTORY — DX: Gout, unspecified: M10.9

## 2018-11-06 SURGERY — COLONOSCOPY WITH PROPOFOL
Anesthesia: General

## 2018-11-06 MED ORDER — LIDOCAINE HCL (CARDIAC) PF 100 MG/5ML IV SOSY
PREFILLED_SYRINGE | INTRAVENOUS | Status: DC | PRN
  Administered 2018-11-06: 50 mg via INTRAVENOUS

## 2018-11-06 MED ORDER — SODIUM CHLORIDE 0.9 % IV SOLN
INTRAVENOUS | Status: DC
  Administered 2018-11-06: 10:00:00 via INTRAVENOUS

## 2018-11-06 MED ORDER — METOPROLOL TARTRATE 5 MG/5ML IV SOLN
2.5000 mg | Freq: Once | INTRAVENOUS | Status: AC
  Administered 2018-11-06: 2.5 mg via INTRAVENOUS
  Filled 2018-11-06: qty 5

## 2018-11-06 MED ORDER — PHENYLEPHRINE HCL 10 MG/ML IJ SOLN
INTRAMUSCULAR | Status: DC | PRN
  Administered 2018-11-06: 100 ug via INTRAVENOUS
  Administered 2018-11-06: 50 ug via INTRAVENOUS
  Administered 2018-11-06: 100 ug via INTRAVENOUS
  Administered 2018-11-06: 50 ug via INTRAVENOUS
  Administered 2018-11-06 (×4): 100 ug via INTRAVENOUS

## 2018-11-06 MED ORDER — PROPOFOL 10 MG/ML IV BOLUS
INTRAVENOUS | Status: DC | PRN
  Administered 2018-11-06: 60 mg via INTRAVENOUS

## 2018-11-06 MED ORDER — PROPOFOL 500 MG/50ML IV EMUL
INTRAVENOUS | Status: DC | PRN
  Administered 2018-11-06: 200 ug/kg/min via INTRAVENOUS

## 2018-11-06 MED ORDER — METOPROLOL TARTRATE 5 MG/5ML IV SOLN
INTRAVENOUS | Status: AC
  Administered 2018-11-06: 2.5 mg via INTRAVENOUS
  Filled 2018-11-06: qty 5

## 2018-11-06 MED ORDER — PROPOFOL 500 MG/50ML IV EMUL
INTRAVENOUS | Status: AC
  Filled 2018-11-06: qty 50

## 2018-11-06 NOTE — Transfer of Care (Signed)
Immediate Anesthesia Transfer of Care Note  Patient: Vincent [name]  Procedure(s) Performed: COLONOSCOPY WITH PROPOFOL (N/A )  Patient Location: PACU  Anesthesia Type:MAC  Level of Consciousness: awake, alert  and oriented  Airway & Oxygen Therapy: Patient Spontanous Breathing and Patient connected to nasal cannula oxygen  Post-op Assessment: Report given to RN  Post vital signs: Reviewed  Last Vitals:  Vitals Value Taken Time  BP 85/70 11/06/2018 12:05 PM  Temp 36.1 C 11/06/2018 12:00 PM  Pulse 118 11/06/2018 12:05 PM  Resp 17 11/06/2018 12:05 PM  SpO2 100 % 11/06/2018 12:05 PM  Vitals shown include unvalidated device data.  Last Pain:  Vitals:   11/06/18 1205  TempSrc:   PainSc: 0-No pain         Complications: No apparent anesthesia complications

## 2018-11-06 NOTE — Anesthesia Postprocedure Evaluation (Signed)
Anesthesia Post Note  Patient: Vincent [name]  Procedure(s) Performed: COLONOSCOPY WITH PROPOFOL (N/A )  Patient location during evaluation: Endoscopy Anesthesia Type: General Level of consciousness: awake and alert and oriented Pain management: pain level controlled Vital Signs Assessment: post-procedure vital signs reviewed and stable Respiratory status: spontaneous breathing, nonlabored ventilation and respiratory function stable Cardiovascular status: blood pressure returned to baseline and stable Postop Assessment: no signs of nausea or vomiting Anesthetic complications: no     Last Vitals:  Vitals:   11/06/18 1220 11/06/18 1230  BP: 97/80 106/80  Pulse:    Temp:    SpO2:      Last Pain:  Vitals:   11/06/18 1205  TempSrc:   PainSc: 0-No pain                 Hancel Ion

## 2018-11-06 NOTE — Anesthesia Preprocedure Evaluation (Signed)
Anesthesia Evaluation  Patient identified by MRN, date of birth, ID band Patient awake    Reviewed: Allergy & Precautions, NPO status , Patient's Chart, lab work & pertinent test results  History of Anesthesia Complications Negative for: history of anesthetic complications  Airway Mallampati: II  TM Distance: >3 FB Neck ROM: Full    Dental no notable dental hx.    Pulmonary neg pulmonary ROS, neg sleep apnea, neg COPD,    breath sounds clear to auscultation- rhonchi (-) wheezing      Cardiovascular Exercise Tolerance: Good + CAD and +CHF  (-) Past MI, (-) Cardiac Stents and (-) CABG + dysrhythmias Atrial Fibrillation  Rhythm:Regular Rate:Normal - Systolic murmurs and - Diastolic murmurs Echo 10/24/17: - Left ventricle: The cavity size was mildly dilated. Wall   thickness was increased in a pattern of mild LVH. Systolic   function was mildly reduced. The estimated ejection fraction was   in the range of 45% to 50%. Diffuse hypokinesis. Regional wall   motion abnormalities cannot be excluded. Features are consistent   with a pseudonormal left ventricular filling pattern, with   concomitant abnormal relaxation and increased filling pressure   (grade 2 diastolic dysfunction). - Aortic valve: There was trivial regurgitation. - Ascending aorta: The ascending aorta was mildly dilated. - Mitral valve: Mildly thickened leaflets . There was mild to   moderate regurgitation. - Left atrium: The atrium was mildly dilated. - Right ventricle: The cavity size was mildly dilated. Wall   thickness was normal. Systolic function was normal. - Right atrium: The atrium was mildly dilated. - Pulmonary arteries: The main pulmonary artery was mildly dilated.   Systolic pressure could not be accurately estimated.   Neuro/Psych neg Seizures negative neurological ROS  negative psych ROS   GI/Hepatic Neg liver ROS, GERD  ,  Endo/Other  neg  diabetesHypothyroidism   Renal/GU negative Renal ROS     Musculoskeletal  (+) Arthritis ,   Abdominal (+) - obese,   Peds  Hematology negative hematology ROS (+)   Anesthesia Other Findings Past Medical History: No date: Arthritis No date: Dizziness and giddiness No date: Dysrhythmia No date: GERD (gastroesophageal reflux disease) No date: Gout No date: History of NICM     Comment:  a. Prev EF as low as 35-40%;  b. 03/2012 Echo: EF 50-55%,              Gr 1 DD, mild MR;  c. 06/2014 Echo: EF 50-55%, gr 1 DD,               mild MR, mildly dil LA. No date: History of systolic CHF     Comment:  a. Prev EF as low as 35-40%;  b. 03/2012 Echo: EF 50-55%,              Gr 1 DD, mild MR;  c. 06/2014 Echo: EF 50-55%, gr 1 DD,               mild MR, mildly dil LA. No date: Hypothyroidism No date: Non-obstructive CAD     Comment:  a. 2009 nonobs dzs, LAD 20, EF 40-45%;  b. 07/2012 Ex MV:              EF 55%, freq pvc's, no ischemia/infarct. No date: PAF (paroxysmal atrial fibrillation) (HCC)     Comment:  a. 11/2013 - occurred 3 wks post-op Appe in setting of  pelvic abscess-->amio-->converted but amio later d/c'd               2/2 hypothyroidism. No date: Paroxysmal supraventricular tachycardia (HCC)     Comment:  a. SVT/PAT No date: Prostate enlargement No date: PSVT (paroxysmal supraventricular tachycardia) (HCC)     Comment:  a. 11/2013. No date: PVC's (premature ventricular contractions)     Comment:  a. Asymptomatic - managed with Toprol and Mg. No date: Sciatica of right side   Reproductive/Obstetrics                             Anesthesia Physical Anesthesia Plan  ASA: III  Anesthesia Plan: General   Post-op Pain Management:    Induction: Intravenous  PONV Risk Score and Plan: 1 and Propofol infusion  Airway Management Planned: Natural Airway  Additional Equipment:   Intra-op Plan:   Post-operative Plan:   Informed Consent:  I have reviewed the patients History and Physical, chart, labs and discussed the procedure including the risks, benefits and alternatives for the proposed anesthesia with the patient or authorized representative who has indicated his/her understanding and acceptance.   Dental advisory given  Plan Discussed with: CRNA and Anesthesiologist  Anesthesia Plan Comments:         Anesthesia Quick Evaluation

## 2018-11-06 NOTE — Interval H&P Note (Signed)
History and Physical Interval Note:  11/06/2018 9:46 AM  Vincent [name]  has presented today for surgery, with the diagnosis of CHANGE IN BOWEL HABITS,PERSONAL HX.COLON POLYPS  The various methods of treatment have been discussed with the patient and family. After consideration of risks, benefits and other options for treatment, the patient has consented to  Procedure(s): COLONOSCOPY WITH PROPOFOL (N/A) as a surgical intervention .  The patient's history has been reviewed, patient examined, no change in status, stable for surgery.  I have reviewed the patient's chart and labs.  Questions were answered to the patient's satisfaction.     [place], [place]

## 2018-11-06 NOTE — Anesthesia Procedure Notes (Signed)
Date/Time: 11/06/2018 11:39 AM Performed by: Riccardo Dubin, CRNA Pre-anesthesia Checklist: Patient identified, Emergency Drugs available, Suction available and Patient being monitored Oxygen Delivery Method: Nasal cannula

## 2018-11-06 NOTE — Op Note (Signed)
[facility] Gastroenterology Patient Name: Vincent [name] Procedure Date: 11/06/2018 11:21 AM MRN: [id number] Account #: [id number] Date of Birth: December 06, 1940 Admit Type: Outpatient Age: 77 Room: [facility] ENDO ROOM 3 Gender: Male Note Status: Finalized Procedure:            Colonoscopy Indications:          Functional diarrhea Providers:            Boykin Nearing. [name] MD, MD Referring MD:         [name], MD (Referring MD) Medicines:            Propofol per Anesthesia Complications:        No immediate complications. Procedure:            Pre-Anesthesia Assessment:                       - The risks and benefits of the procedure and the                        sedation options and risks were discussed with the                        patient. All questions were answered and informed                        consent was obtained.                       - Patient identification and proposed procedure were                        verified prior to the procedure by the nurse. The                        procedure was verified in the procedure room.                       - ASA Grade Assessment: III - A patient with severe                        systemic disease.                       - After reviewing the risks and benefits, the patient                        was deemed in satisfactory condition to undergo the                        procedure.                       After obtaining informed consent, the colonoscope was                        passed under direct vision. Throughout the procedure,                        the patient's blood pressure, pulse, and oxygen  saturations were monitored continuously. The                        Colonoscope was introduced through the anus and                        advanced to the the cecum, identified by appendiceal                        orifice and ileocecal valve. The colonoscopy was                         performed without difficulty. The patient tolerated the                        procedure well. The quality of the bowel preparation                        was excellent. The ileocecal valve, appendiceal                        orifice, and rectum were photographed. Findings:      The perianal and digital rectal examinations were normal. Pertinent       negatives include normal sphincter tone and no palpable rectal lesions.      Many small and large-mouthed diverticula were found in the sigmoid colon.      Non-bleeding internal hemorrhoids were found during retroflexion. The       hemorrhoids were Grade II (internal hemorrhoids that prolapse but reduce       spontaneously).      The exam was otherwise without abnormality.      Biopsies for histology were taken with a cold forceps from the random       colon for evaluation of microscopic colitis. Impression:           - Diverticulosis in the sigmoid colon.                       - Non-bleeding internal hemorrhoids.                       - The examination was otherwise normal.                       - Biopsies were taken with a cold forceps from the                        random colon for evaluation of microscopic colitis. Recommendation:       - Patient has a contact number available for                        emergencies. The signs and symptoms of potential                        delayed complications were discussed with the patient.                        Return to normal activities tomorrow. Written discharge                        instructions  were provided to the patient.                       - Resume previous diet.                       - Continue present medications.                       - Await pathology results.                       - No repeat colonoscopy due to current age (90 years or                        older) and the absence of advanced adenomas.                       - The findings and recommendations were discussed with                         the patient and their family.                       - Return to physician assistant in 3 months. Procedure Code(s):    --- Professional ---                       [phone], Colonoscopy, flexible; with biopsy, single or                        multiple Diagnosis Code(s):    --- Professional ---                       K57.30, Diverticulosis of large intestine without                        perforation or abscess without bleeding                       K59.1, Functional diarrhea                       K64.1, Second degree hemorrhoids CPT copyright 2018 American Medical Association. All rights reserved. The codes documented in this report are preliminary and upon coder review may  be revised to meet current compliance requirements. Stanton Kidney MD, MD 11/06/2018 11:57:25 AM This report has been signed electronically. Number of Addenda: 0 Note Initiated On: 11/06/2018 11:21 AM Scope Withdrawal Time: 0 hours 8 minutes 34 seconds  Total Procedure Duration: 0 hours 13 minutes 0 seconds       [facility]

## 2018-11-06 NOTE — Anesthesia Post-op Follow-up Note (Signed)
Anesthesia QCDR form completed.        

## 2018-11-06 NOTE — H&P (Signed)
Outpatient short stay form Pre-procedure 11/06/2018 9:45 AM Vincent [name] K. [name], M.D.  Primary Physician: [name], M.D.  Reason for visit:  Change in bowel habits, diarrhea, personal hx of colon polyps  History of present illness:  Patient with intermittent loose, watery stools for about 4 months with symptoms of urgency and abdominal cramping that are relieved by having a BM. No rectal bleeding. Has personal hx of colon polyps overdue since 2018.     Current Facility-Administered Medications:  .  0.9 %  sodium chloride infusion, , Intravenous, Continuous, Rileigh Kawashima, Boykin Nearing, MD  Medications Prior to Admission  Medication Sig Dispense Refill Last Dose  . ferrous sulfate 325 (65 FE) MG tablet Take 325 mg by mouth daily with breakfast.     . naproxen (NAPROSYN) 250 MG tablet Take 220 mg by mouth 2 (two) times daily with a meal.     . pantoprazole (PROTONIX) 40 MG tablet Take 40 mg by mouth daily.     [name] acetaminophen (TYLENOL) 500 MG tablet Take 1,000 mg by mouth daily as needed for moderate pain or headache.   Taking  . amoxicillin (AMOXIL) 500 MG tablet Take 2,000 mg as needed by mouth (before dental procedures).   Taking  . BIOTIN PO Take 1 tablet by mouth daily.   Taking  . Carboxymethylcellul-Glycerin (LUBRICATING EYE DROPS OP) Apply 1 drop to eye 2 (two) times daily as needed (dry eyes).   Taking  . dutasteride (AVODART) 0.5 MG capsule Take 1 capsule (0.5 mg total) by mouth every other day. 30 capsule 3 Taking  . gabapentin (NEURONTIN) 300 MG capsule Take 600 mg by mouth 3 (three) times daily.    Taking  . HYDROcodone-acetaminophen (NORCO) 7.5-325 MG tablet Take 1 tablet by mouth every 6 (six) hours as needed for moderate pain. 50 tablet 0 Taking  . levothyroxine (SYNTHROID, LEVOTHROID) 25 MCG tablet Take 25 mcg by mouth daily before breakfast.   Taking  . Magnesium Oxide 400 (240 MG) MG TABS Take 1 tablet by mouth 2 (two) times daily. 60 tablet 6 Taking  . metoprolol succinate  (TOPROL-XL) 50 MG 24 hr tablet Take 1 tablet (50 mg total) by mouth daily. Take with or immediately following a meal. 90 tablet 3   . mirabegron ER (MYRBETRIQ) 50 MG TB24 tablet Take 1 tablet (50 mg total) by mouth daily. 30 tablet 11   . Omega-3 Fatty Acids (FISH OIL) 1200 MG CAPS Take 1,200 mg by mouth 2 (two) times daily.   Taking  . omeprazole (PRILOSEC) 20 MG capsule Take 20 mg by mouth daily.     Taking  . tamsulosin (FLOMAX) 0.4 MG CAPS capsule Take 1 capsule (0.4 mg total) by mouth daily. 90 capsule 3 Taking     No Known Allergies   Past Medical History:  Diagnosis Date  . Arthritis   . Dizziness and giddiness   . Dysrhythmia   . GERD (gastroesophageal reflux disease)   . Gout   . History of NICM    a. Prev EF as low as 35-40%;  b. 03/2012 Echo: EF 50-55%, Gr 1 DD, mild MR;  c. 06/2014 Echo: EF 50-55%, gr 1 DD, mild MR, mildly dil LA.  [name] History of systolic CHF    a. Prev EF as low as 35-40%;  b. 03/2012 Echo: EF 50-55%, Gr 1 DD, mild MR;  c. 06/2014 Echo: EF 50-55%, gr 1 DD, mild MR, mildly dil LA.  [name] Hypothyroidism   . Non-obstructive CAD  a. 2009 nonobs dzs, LAD 20, EF 40-45%;  b. 07/2012 Ex MV: EF 55%, freq pvc's, no ischemia/infarct.  [name] PAF (paroxysmal atrial fibrillation) (HCC)    a. 11/2013 - occurred 3 wks post-op Appe in setting of pelvic abscess-->amio-->converted but amio later d/c'd 2/2 hypothyroidism.  . Paroxysmal supraventricular tachycardia (HCC)    a. SVT/PAT  . Prostate enlargement   . PSVT (paroxysmal supraventricular tachycardia) (HCC)    a. 11/2013.  [name] PVC's (premature ventricular contractions)    a. Asymptomatic - managed with Toprol and Mg.  . Sciatica of right side     Review of systems:  Otherwise negative.    Physical Exam  Gen: Alert, oriented. Appears stated age.  HEENT: [place]/AT. PERRLA. Lungs: CTA, no wheezes. CV: RR nl S1, S2. Abd: soft, benign, no masses. BS+ Ext: No edema. Pulses 2+    Planned procedures: Proceed with colonoscopy.  The patient understands the nature of the planned procedure, indications, risks, alternatives and potential complications including but not limited to bleeding, infection, perforation, damage to internal organs and possible oversedation/side effects from anesthesia. The patient agrees and gives consent to proceed.  Please refer to procedure notes for findings, recommendations and patient disposition/instructions.     Brycelyn Gambino K. [name], M.D. Gastroenterology 11/06/2018  9:45 AM

## 2018-11-07 LAB — SURGICAL PATHOLOGY

## 2018-11-25 ENCOUNTER — Other Ambulatory Visit: Payer: Self-pay | Admitting: Urology

## 2018-11-25 ENCOUNTER — Telehealth: Payer: Self-pay | Admitting: Family Medicine

## 2018-11-25 NOTE — Telephone Encounter (Signed)
[facility] called and states the Dutasteride is not covered by insurance and they are recommending Finasteride. I spoke to patient and he states he will take whatever you think is best. If you are encouraging him to stay on Dutasteride it will need a PA.

## 2018-11-25 NOTE — Telephone Encounter (Signed)
Patient left a voicemail asking about a refill request [facility] that we have not responded to? Did not say what the medication was for. Have we received anything for this patient?   Vincent [name]

## 2018-11-26 NOTE — Telephone Encounter (Signed)
I think we are fine to switch to finasteride however let him know this medication needs to be taken daily.  If he is okay with that we will send in an Rx.

## 2018-11-27 MED ORDER — FINASTERIDE 5 MG PO TABS: 5.0000 mg | ORAL_TABLET | Freq: Every day | ORAL | 3 refills | Status: DC

## 2019-01-20 ENCOUNTER — Other Ambulatory Visit: Payer: Self-pay | Admitting: Family Medicine

## 2019-01-20 MED ORDER — TAMSULOSIN HCL 0.4 MG PO CAPS: 0.4000 mg | ORAL_CAPSULE | Freq: Every day | ORAL | 3 refills | Status: DC

## 2019-01-22 ENCOUNTER — Telehealth: Payer: Self-pay | Admitting: Urology

## 2019-01-22 MED ORDER — TAMSULOSIN HCL 0.4 MG PO CAPS: 0.4000 mg | ORAL_CAPSULE | Freq: Every day | ORAL | 3 refills | Status: DC

## 2019-01-22 NOTE — Telephone Encounter (Signed)
Tamsulosin sent to [facility] per pt request rather than than [facility]

## 2019-01-22 NOTE — Telephone Encounter (Signed)
Pt wants Rx for Tamsulosin to be sent to [facility] instead of [facility]. Please advise.

## 2019-02-04 IMAGING — CR DG CHEST 2V
2 series · 2 of 2 positions shown · non-contrast
Comparison: CT chest 01/12/2008

CLINICAL DATA: Preop hand surgery.

EXAM:
CHEST  2 VIEW

[chest pa]
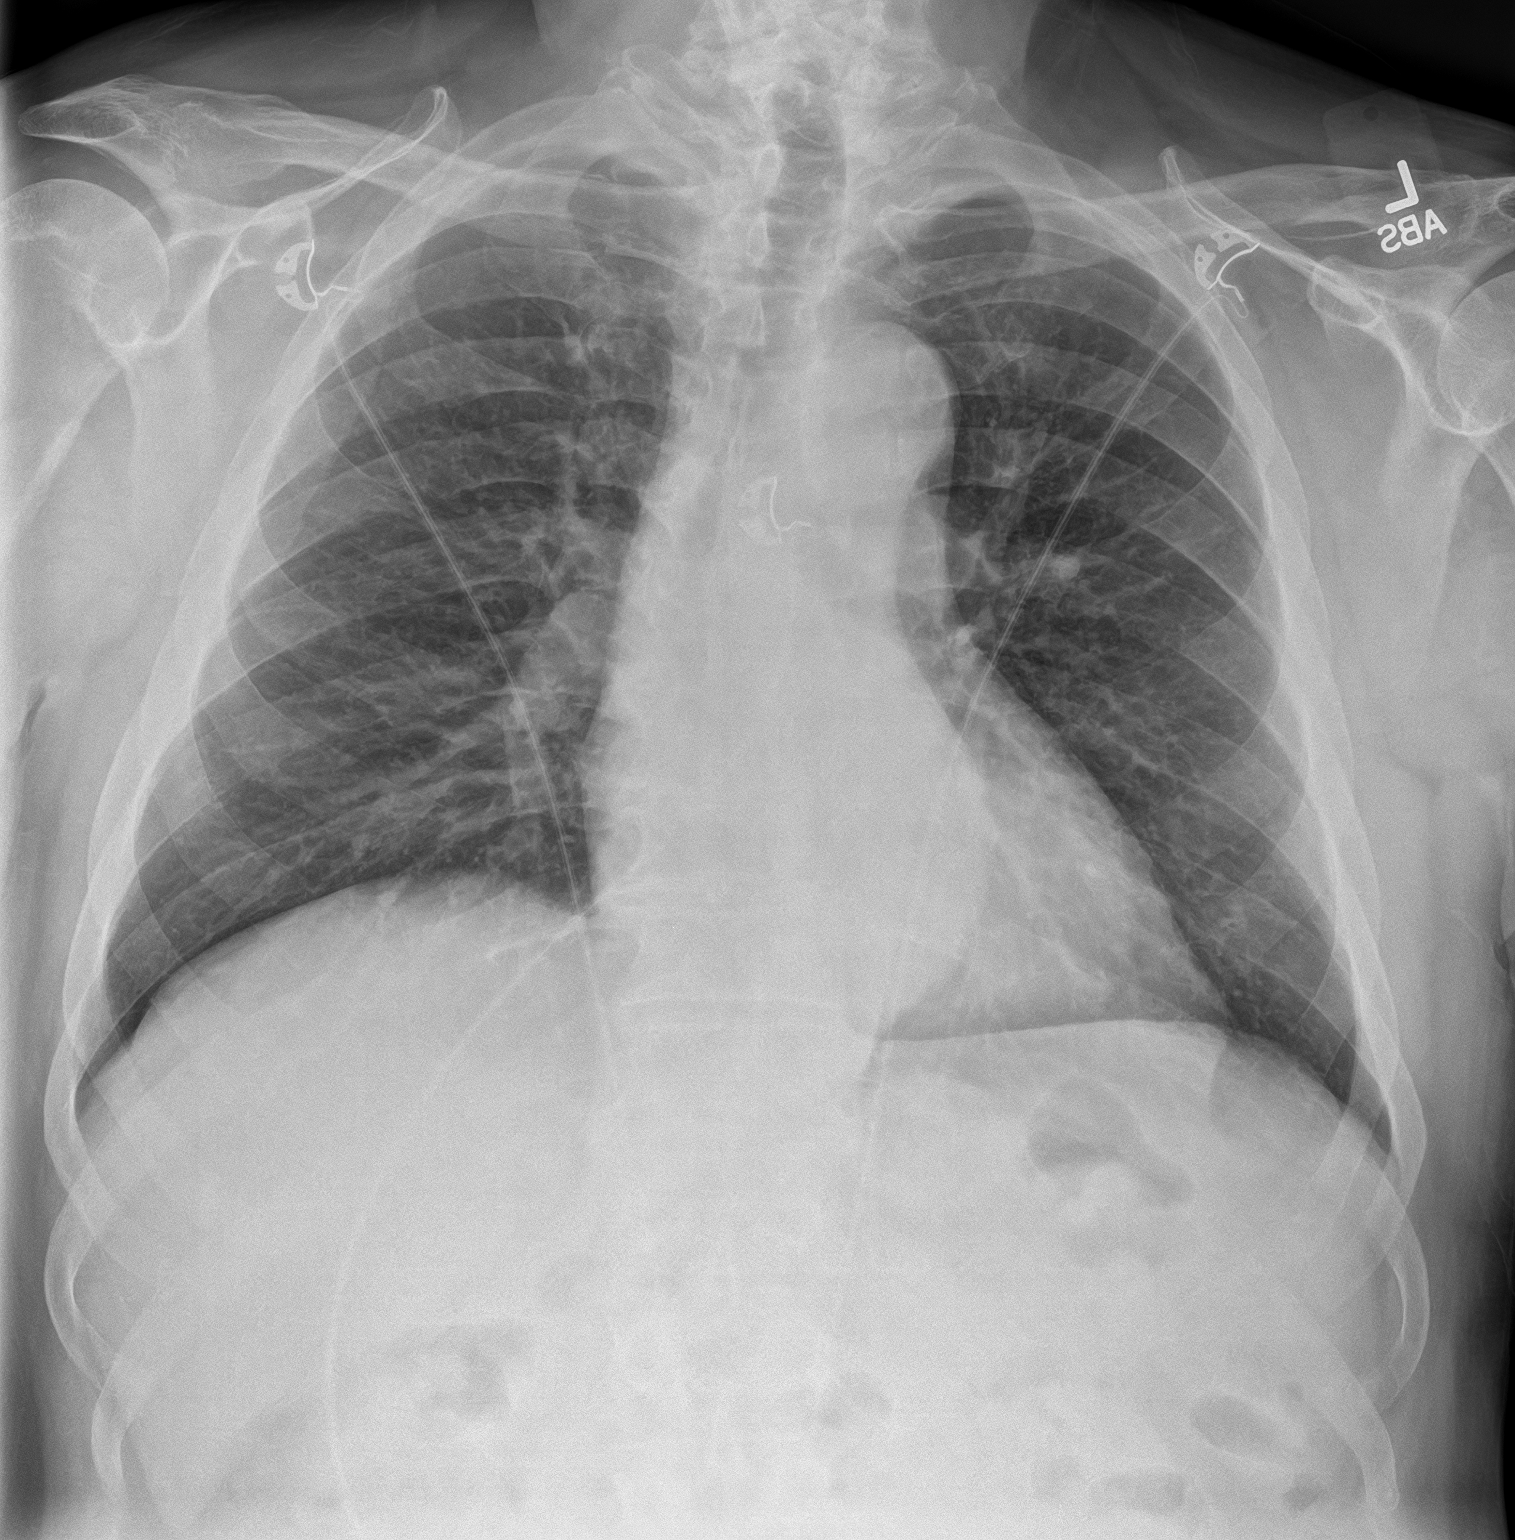

[chest lat]
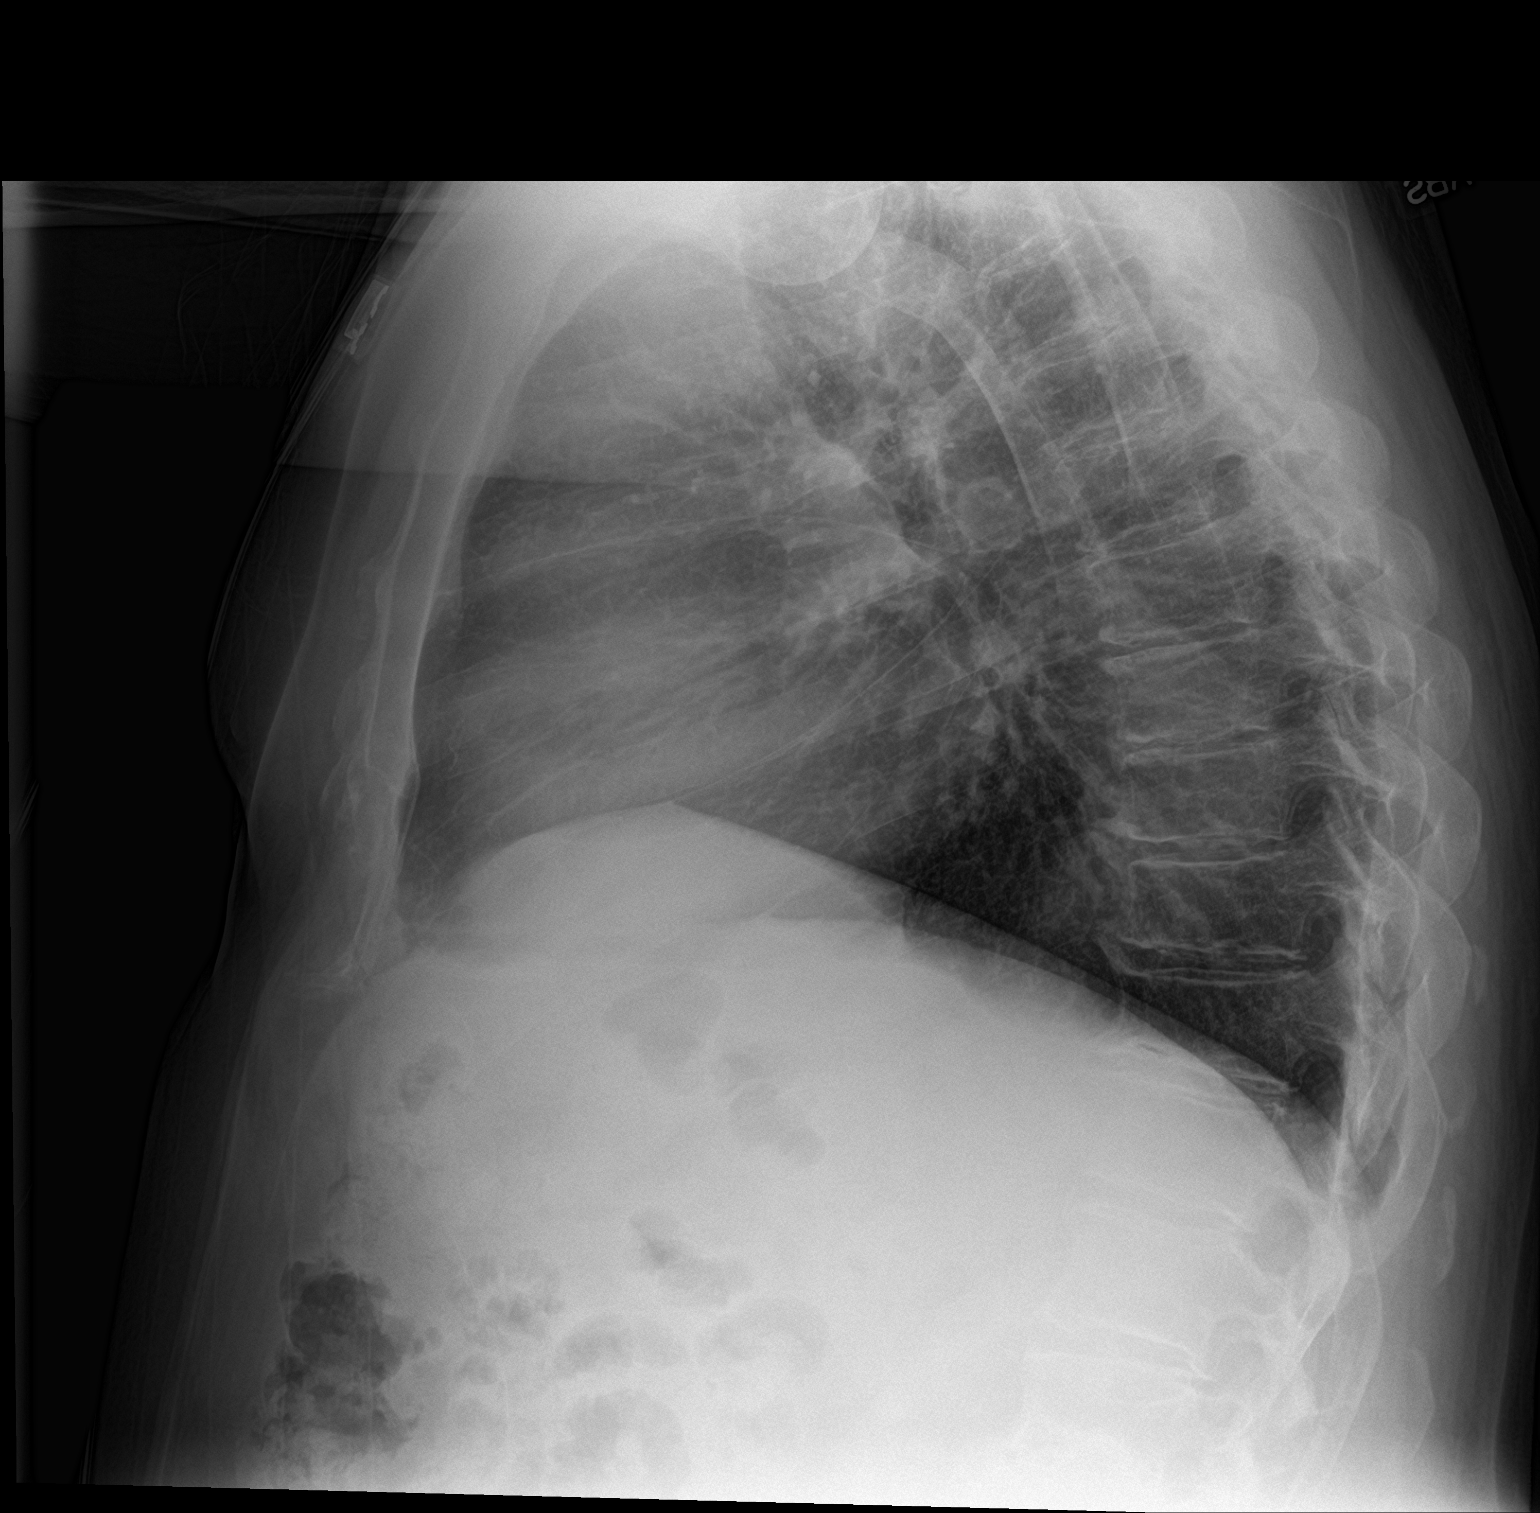

[2 of 2 positions shown; findings below may reference images not displayed]

FINDINGS: The heart size and mediastinal contours are within normal limits.
Both lungs are clear. The visualized skeletal structures are
unremarkable.
IMPRESSION: No active cardiopulmonary disease.

## 2019-02-10 ENCOUNTER — Other Ambulatory Visit: Payer: Self-pay | Admitting: Cardiovascular Disease

## 2019-03-05 ENCOUNTER — Other Ambulatory Visit: Payer: Self-pay | Admitting: *Deleted

## 2019-03-05 DIAGNOSIS — R350 Frequency of micturition: Principal | ICD-10-CM

## 2019-03-05 DIAGNOSIS — N401 Enlarged prostate with lower urinary tract symptoms: Secondary | ICD-10-CM

## 2019-03-05 DIAGNOSIS — R972 Elevated prostate specific antigen [PSA]: Secondary | ICD-10-CM

## 2019-03-05 MED ORDER — DUTASTERIDE 0.5 MG PO CAPS: 0.5000 mg | ORAL_CAPSULE | ORAL | 3 refills | Status: DC

## 2019-04-05 ENCOUNTER — Other Ambulatory Visit: Payer: Self-pay | Admitting: Cardiovascular Disease

## 2019-04-07 ENCOUNTER — Telehealth: Payer: Self-pay | Admitting: Cardiovascular Disease

## 2019-04-07 NOTE — Telephone Encounter (Signed)
Patient sent a message through [name] requesting a refill for 90day supply of Metoprolol [facility]. He recently received a refill for #30 R-0 Message returned through [place] to the patient that he is past due for follow up with Dr. Kirke Corin and he should contact the office to schedule an appt.

## 2019-04-07 NOTE — Telephone Encounter (Signed)
Scheduled for 5/21 virtual with Dr Kirke Corin

## 2019-04-07 NOTE — Telephone Encounter (Signed)
-----   Message from Vincent [name], [place] sent at 04/07/2019  8:42 AM EDT ----- Please schedule patient an appointment. Due for follow up visit.

## 2019-04-07 NOTE — Telephone Encounter (Signed)
Virtual Visit Pre-Appointment Phone Call  "(Name), I am calling you today to discuss your upcoming appointment. We are currently trying to limit exposure to the virus that causes COVID-19 by seeing patients at home rather than in the office."  1. "What is the BEST phone number to call the day of the visit?" - include this in appointment notes  2. Do you have or have access to (through a family member/friend) a smartphone with video capability that we can use for your visit?" a. If yes - list this number in appt notes as cell (if different from BEST phone #) and list the appointment type as a VIDEO visit in appointment notes b. If no - list the appointment type as a PHONE visit in appointment notes  3. Confirm consent - "In the setting of the current Covid19 crisis, you are scheduled for a (phone or video) visit with your provider on (date) at (time).  Just as we do with many in-office visits, in order for you to participate in this visit, we must obtain consent.  If you'd like, I can send this to your mychart (if signed up) or email for you to review.  Otherwise, I can obtain your verbal consent now.  All virtual visits are billed to your insurance company just like a normal visit would be.  By agreeing to a virtual visit, we'd like you to understand that the technology does not allow for your provider to perform an examination, and thus may limit your provider's ability to fully assess your condition. If your provider identifies any concerns that need to be evaluated in person, we will make arrangements to do so.  Finally, though the technology is pretty good, we cannot assure that it will always work on either your or our end, and in the setting of a video visit, we may have to convert it to a phone-only visit.  In either situation, we cannot ensure that we have a secure connection.  Are you willing to proceed?" STAFF: Did the patient verbally acknowledge consent to telehealth visit? Document  YES/NO here: YES  4. Advise patient to be prepared - "Two hours prior to your appointment, go ahead and check your blood pressure, pulse, oxygen saturation, and your weight (if you have the equipment to check those) and write them all down. When your visit starts, your provider will ask you for this information. If you have an Apple Watch or Kardia device, please plan to have heart rate information ready on the day of your appointment. Please have a pen and paper handy nearby the day of the visit as well."  5. Give patient instructions for MyChart download to smartphone OR Doximity/Doxy.me as below if video visit (depending on what platform provider is using)  6. Inform patient they will receive a phone call 15 minutes prior to their appointment time (may be from unknown caller ID) so they should be prepared to answer    TELEPHONE CALL NOTE  Vincent [name] has been deemed a candidate for a follow-up tele-health visit to limit community exposure during the Covid-19 pandemic. I spoke with the patient via phone to ensure availability of phone/video source, confirm preferred email & phone number, and discuss instructions and expectations.  I reminded Vincent [name] to be prepared with any vital sign and/or heart rhythm information that could potentially be obtained via home monitoring, at the time of his visit. I reminded Vincent [name] to expect a phone call prior to  his visit.  Vincent [name] 04/07/2019 2:00 PM   INSTRUCTIONS FOR DOWNLOADING THE MYCHART APP TO SMARTPHONE  - The patient must first make sure to have activated MyChart and know their login information - If Apple, go to [employer] and type in MyChart in the search bar and download the app. If Android, ask patient to go to [employer] and type in [place] in the search bar and download the app. The app is free but as with any other app downloads, their phone may require them to verify saved payment information or  Apple/Android password.  - The patient will need to then log into the app with their MyChart username and password, and select [place] as their healthcare provider to link the account. When it is time for your visit, go to the MyChart app, find appointments, and click Begin Video Visit. Be sure to Select Allow for your device to access the Microphone and Camera for your visit. You will then be connected, and your provider will be with you shortly.  **If they have any issues connecting, or need assistance please contact MyChart service desk [phone])**  **If using a computer, in order to ensure the best quality for their visit they will need to use either of the following Internet Browsers: [employer] or Google Chrome**  IF USING DOXIMITY or DOXY.ME - The patient will receive a link just prior to their visit by text.     FULL LENGTH CONSENT FOR TELE-HEALTH VISIT   I hereby voluntarily request, consent and authorize [facility] and its employed or contracted physicians, physician assistants, nurse practitioners or other licensed health care professionals (the Practitioner), to provide me with telemedicine health care services (the Services") as deemed necessary by the treating Practitioner. I acknowledge and consent to receive the Services by the Practitioner via telemedicine. I understand that the telemedicine visit will involve communicating with the Practitioner through live audiovisual communication technology and the disclosure of certain medical information by electronic transmission. I acknowledge that I have been given the opportunity to request an in-person assessment or other available alternative prior to the telemedicine visit and am voluntarily participating in the telemedicine visit.  I understand that I have the right to withhold or withdraw my consent to the use of telemedicine in the course of my care at any time, without affecting my right to future care  or treatment, and that the Practitioner or I may terminate the telemedicine visit at any time. I understand that I have the right to inspect all information obtained and/or recorded in the course of the telemedicine visit and may receive copies of available information for a reasonable fee.  I understand that some of the potential risks of receiving the Services via telemedicine include:   Delay or interruption in medical evaluation due to technological equipment failure or disruption;  Information transmitted may not be sufficient (e.g. poor resolution of images) to allow for appropriate medical decision making by the Practitioner; and/or   In rare instances, security protocols could fail, causing a breach of personal health information.  Furthermore, I acknowledge that it is my responsibility to provide information about my medical history, conditions and care that is complete and accurate to the best of my ability. I acknowledge that Practitioner's advice, recommendations, and/or decision may be based on factors not within their control, such as incomplete or inaccurate data provided by me or distortions of diagnostic images or specimens that may result from electronic transmissions. I understand  that the practice of medicine is not an exact science and that Practitioner makes no warranties or guarantees regarding treatment outcomes. I acknowledge that I will receive a copy of this consent concurrently upon execution via email to the email address I last provided but may also request a printed copy by calling the office of [place].    I understand that my insurance will be billed for this visit.   I have read or had this consent read to me.  I understand the contents of this consent, which adequately explains the benefits and risks of the Services being provided via telemedicine.   I have been provided ample opportunity to ask questions regarding this consent and the Services and have had  my questions answered to my satisfaction.  I give my informed consent for the services to be provided through the use of telemedicine in my medical care  By participating in this telemedicine visit I agree to the above.

## 2019-04-07 NOTE — Telephone Encounter (Signed)
Please review for refill.  

## 2019-04-07 NOTE — Telephone Encounter (Signed)
lmov to schedule overdue fu with [place]

## 2019-04-07 NOTE — Telephone Encounter (Signed)
Patient has scheduled a 04/10/19 virtual visit with Dr. Kirke Corin Metoprolol refilled for 50mg  daily #90 R-0.

## 2019-04-10 ENCOUNTER — Other Ambulatory Visit: Payer: Self-pay

## 2019-04-10 ENCOUNTER — Encounter: Payer: Self-pay | Admitting: Cardiovascular Disease

## 2019-04-10 ENCOUNTER — Telehealth (INDEPENDENT_AMBULATORY_CARE_PROVIDER_SITE_OTHER): Payer: Medicare Other | Admitting: Cardiovascular Disease

## 2019-04-10 VITALS — BP 95/66 | HR 62 | Ht 70.5 in | Wt 215.0 lb

## 2019-04-10 DIAGNOSIS — I471 Supraventricular tachycardia: Secondary | ICD-10-CM

## 2019-04-10 DIAGNOSIS — I493 Ventricular premature depolarization: Secondary | ICD-10-CM

## 2019-04-10 NOTE — Progress Notes (Signed)
Virtual Visit via Video Note   This visit type was conducted due to national recommendations for restrictions regarding the COVID-19 Pandemic (e.g. social distancing) in an effort to limit this patient's exposure and mitigate transmission in our community.  Due to his co-morbid illnesses, this patient is at least at moderate risk for complications without adequate follow up.  This format is felt to be most appropriate for this patient at this time.  All issues noted in this document were discussed and addressed.  A limited physical exam was performed with this format.  Please refer to the patient's chart for his consent to telehealth for [facility].   Date:  04/10/2019   ID:  Vincent [name], DOB 09-01-1941, MRN [id number]  Patient Location: Home Provider Location: Office  PCP:  [name], MD  Cardiologist:  [name], MD  Electrophysiologist:  None   Evaluation Performed:  Follow-Up Visit  Chief Complaint: No complaints today  History of Present Illness:    Vincent [name] is a 78 y.o. male who was seen via video visit for follow-up  regarding supraventricular tachycardia, mild cardiomyopathy, postoperative atrial fibrillation and ventricular bigeminy.  Catheterization from 2009 showed an ejection fraction of 40-45% range with a mild nonobstructive 20% plaque in his LAD.  Most recent nuclear stress test in 2013 was normal.  Echocardiogram in December, 2018 showed an EF of 45-50%, mild biatrial enlargement. His symptoms have been controlled with Toprol 50 mg once daily.  He denies any chest pain or shortness of breath.  He continues to have intermittent palpitations but no prolonged tachycardia.  The patient does not have symptoms concerning for COVID-19 infection (fever, chills, cough, or new shortness of breath).    Past Medical History:  Diagnosis Date  . Arthritis   . Dizziness and giddiness   . Dysrhythmia   . GERD (gastroesophageal reflux disease)    . Gout   . History of NICM    a. Prev EF as low as 35-40%;  b. 03/2012 Echo: EF 50-55%, Gr 1 DD, mild MR;  c. 06/2014 Echo: EF 50-55%, gr 1 DD, mild MR, mildly dil LA.  [name] History of systolic CHF    a. Prev EF as low as 35-40%;  b. 03/2012 Echo: EF 50-55%, Gr 1 DD, mild MR;  c. 06/2014 Echo: EF 50-55%, gr 1 DD, mild MR, mildly dil LA.  [name] Hypothyroidism   . Non-obstructive CAD    a. 2009 nonobs dzs, LAD 20, EF 40-45%;  b. 07/2012 Ex MV: EF 55%, freq pvc's, no ischemia/infarct.  [name] PAF (paroxysmal atrial fibrillation) (HCC)    a. 11/2013 - occurred 3 wks post-op Appe in setting of pelvic abscess-->amio-->converted but amio later d/c'd 2/2 hypothyroidism.  . Paroxysmal supraventricular tachycardia (HCC)    a. SVT/PAT  . Prostate enlargement   . PSVT (paroxysmal supraventricular tachycardia) (HCC)    a. 11/2013.  [name] PVC's (premature ventricular contractions)    a. Asymptomatic - managed with Toprol and Mg.  . Sciatica of right side    Past Surgical History:  Procedure Laterality Date  . ABSCESS DRAINAGE    . APPENDECTOMY  2015  . BACK SURGERY    . CARDIAC CATHETERIZATION  2009  . CATARACT EXTRACTION Right   . CATARACT EXTRACTION W/PHACO Right 10/17/2017   Procedure: CATARACT EXTRACTION PHACO AND INTRAOCULAR LENS PLACEMENT (IOC) RIGHT;  Surgeon: [name], MD;  Location: [facility];  Service: Ophthalmology;  Laterality: Right;  . CATARACT EXTRACTION W/PHACO Left 12/24/2017  Procedure: CATARACT EXTRACTION PHACO AND INTRAOCULAR LENS PLACEMENT (IOC) LEFT;  Surgeon: [name], MD;  Location: [facility];  Service: Ophthalmology;  Laterality: Left;  . COLONOSCOPY    . COLONOSCOPY WITH PROPOFOL N/A 11/06/2018   Procedure: COLONOSCOPY WITH PROPOFOL;  Surgeon: Toledo, Boykin Nearing, MD;  Location: [facility];  Service: Gastroenterology;  Laterality: N/A;  . DUPUYTREN / PALMAR FASCIOTOMY Bilateral   . 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   Procedure:  DUPUYTREN CONTRACTURE RELEASE;  Surgeon: [name], MD;  Location: [facility];  Service: Orthopedics;  Laterality: Left;  left index and fourth finger  . HAND SURGERY  1999  . JOINT REPLACEMENT Right 2012   TKR  . PARTIAL KNEE ARTHROPLASTY Left   . TOTAL KNEE ARTHROPLASTY Right 06/17/2017     Current Meds  Medication Sig  . acetaminophen (TYLENOL) 500 MG tablet Take 1,000 mg by mouth daily as needed for moderate pain or headache.  [name] amoxicillin (AMOXIL) 500 MG tablet Take 2,000 mg as needed by mouth (before dental procedures).  [name] BIOTIN PO Take 1 tablet by mouth daily.  . Carboxymethylcellul-Glycerin (LUBRICATING EYE DROPS OP) Apply 1 drop to eye 2 (two) times daily as needed (dry eyes).  [name] diclofenac (VOLTAREN) 75 MG EC tablet Take 75 mg by mouth 2 (two) times daily.  [name] dutasteride (AVODART) 0.5 MG capsule Take 1 capsule (0.5 mg total) by mouth every other day.  . finasteride (PROSCAR) 5 MG tablet Take 1 tablet (5 mg total) by mouth daily.  [name] gabapentin (NEURONTIN) 300 MG capsule Take 300 mg by mouth 4 (four) times daily.   [name] HYDROcodone-acetaminophen (NORCO) 7.5-325 MG tablet Take 1 tablet by mouth every 6 (six) hours as needed for moderate pain.  [name] levothyroxine (SYNTHROID, LEVOTHROID) 25 MCG tablet Take 25 mcg by mouth daily before breakfast.  . Magnesium Oxide 400 (240 MG) MG TABS Take 1 tablet by mouth 2 (two) times daily.  . metoprolol succinate (TOPROL-XL) 50 MG 24 hr tablet USE AS INSTRUCTED BY YOUR PRESCRIBER (Patient taking differently: Take 50 mg by mouth daily. )  . Omega-3 Fatty Acids (FISH OIL) 1200 MG CAPS Take 1,200 mg by mouth 2 (two) times daily.  . pantoprazole (PROTONIX) 40 MG tablet Take 40 mg by mouth daily.  . tamsulosin (FLOMAX) 0.4 MG CAPS capsule Take 1 capsule (0.4 mg total) by mouth daily.     Allergies:   Patient has no known allergies.   Social History   Tobacco Use  . Smoking status: Never Smoker  . Smokeless tobacco: Never Used  . Tobacco comment:  tobacco use - no  Substance Use Topics  . Alcohol use: No  . Drug use: No     Family Hx: The patient's family history includes Coronary artery disease in an other family member; Heart attack (age of onset: 31) in his father; Heart failure in an other family member.  ROS:   Please see the history of present illness.     All other systems reviewed and are negative.   Prior CV studies:   The following studies were reviewed today:    Labs/Other Tests and Data Reviewed:    EKG:  No ECG reviewed.  Recent Labs: No results found for requested labs within last 8760 hours.   Recent Lipid Panel Lab Results  Component Value Date/Time   CHOL 218 10/25/2009   TRIG 189 10/25/2009   HDL 37.3 10/25/2009   LDLCALC 142.9 10/25/2009    Wt Readings from Last 3 Encounters:  04/10/19 215 lb (97.5 kg)  11/06/18 212 lb (96.2 kg)  10/03/18 221 lb (100.2 kg)     Objective:    Vital Signs:  BP 95/66 Comment: 9:30 am today  Pulse 62   Ht 5' 10.5" (1.791 m)   Wt 215 lb (97.5 kg)   BMI 30.41 kg/m    VITAL SIGNS:  reviewed GEN:  no acute distress EYES:  sclerae anicteric, EOMI - Extraocular Movements Intact RESPIRATORY:  normal respiratory effort, symmetric expansion SKIN:  no rash, lesions or ulcers. MUSCULOSKELETAL:  no obvious deformities. NEURO:  alert and oriented x 3, no obvious focal deficit PSYCH:  normal affect  ASSESSMENT & PLAN:    1.  Paroxysmal supraventricular tachycardia: His symptoms are reasonably controlled with current dose of Toprol 50 mg once daily  2. 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  3. History of nonischemic cardiomyopathy: Most recent echo showed mildly reduced EF.  Continue Toprol.  Blood pressure is low and does not allow the addition of an ACE inhibitor or ARB.   COVID-19 Education: The signs and symptoms of COVID-19 were discussed with the patient and how to seek care for testing (follow up with PCP or arrange E-visit).  The importance of social  distancing was discussed today.  Time:   Today, I have spent 10 minutes with the patient with telehealth technology discussing the above problems.     Medication Adjustments/Labs and Tests Ordered: Current medicines are reviewed at length with the patient today.  Concerns regarding medicines are outlined above.   Tests Ordered: No orders of the defined types were placed in this encounter.   Medication Changes: No orders of the defined types were placed in this encounter.   Disposition:  Follow up in 6 month(s)  Signed, [name], MD  04/10/2019 11:15 AM    [facility]

## 2019-04-10 NOTE — Patient Instructions (Signed)
Medication Instructions:  Continue same medications If you need a refill on your cardiac medications before your next appointment, please call your pharmacy.   Lab work: None If you have labs (blood work) drawn today and your tests are completely normal, you will receive your results only by: . MyChart Message (if you have MyChart) OR . A paper copy in the mail If you have any lab test that is abnormal or we need to change your treatment, we will call you to review the results.  Testing/Procedures: None  Follow-Up: At [facility], you and your health needs are our priority.  As part of our continuing mission to provide you with exceptional heart care, we have created designated Provider Care Teams.  These Care Teams include your primary Cardiologist (physician) and Advanced Practice Providers (APPs -  Physician Assistants and Nurse Practitioners) who all work together to provide you with the care you need, when you need it. You will need a follow up appointment in 6 months.  Please call our office 2 months in advance to schedule this appointment.  You may see Muhammad Arida, MD or one of the following Advanced Practice Providers on your designated Care Team:   Christopher Berge, NP Ryan Dunn, PA-C . Jacquelyn Visser, PA-C   

## 2019-06-30 ENCOUNTER — Other Ambulatory Visit: Payer: Self-pay | Admitting: Cardiovascular Disease

## 2019-10-04 ENCOUNTER — Other Ambulatory Visit: Payer: Self-pay | Admitting: Cardiovascular Disease

## 2019-10-08 ENCOUNTER — Other Ambulatory Visit: Payer: Self-pay

## 2019-10-08 ENCOUNTER — Encounter: Payer: Self-pay | Admitting: Urology

## 2019-10-08 ENCOUNTER — Ambulatory Visit (INDEPENDENT_AMBULATORY_CARE_PROVIDER_SITE_OTHER): Payer: Medicare Other | Admitting: Urology

## 2019-10-08 DIAGNOSIS — N401 Enlarged prostate with lower urinary tract symptoms: Secondary | ICD-10-CM

## 2019-10-08 DIAGNOSIS — R972 Elevated prostate specific antigen [PSA]: Secondary | ICD-10-CM

## 2019-10-08 DIAGNOSIS — R350 Frequency of micturition: Secondary | ICD-10-CM | POA: Diagnosis not present

## 2019-10-08 MED ORDER — TAMSULOSIN HCL 0.4 MG PO CAPS: 0.4000 mg | ORAL_CAPSULE | Freq: Every day | ORAL | 3 refills | Status: DC

## 2019-10-08 MED ORDER — FINASTERIDE 5 MG PO TABS: 5.0000 mg | ORAL_TABLET | Freq: Every day | ORAL | 3 refills | Status: DC

## 2019-10-08 NOTE — Progress Notes (Signed)
10/08/2019 9:43 AM   Vincent [name] 1941/01/25 [id number]  Referring provider: [name], MD [address] [facility] [place],  [place] [address]  Chief Complaint  Patient presents with  . Benign Prostatic Hypertrophy    Urologic history: 1.  BPH with lower urinary tract symptoms -On dutasteride several years taking every other day -Tamsulosin added last year for increased urgency/nocturia  2.  Elevated PSA -Prostate biopsy early 2000 PSA 6.1; benign -Rebiopsy 2006; PSA 6.6; benign -Started on dutasteride with baseline PSA low-mid 2 range. -Prostate cancer screening discontinued 2019    HPI: 78 y.o. male presents for annual follow-up.  Since his visit last year he has done well.  He was switched from dutasteride to finasteride based on insurance coverage.  He remains on tamsulosin.  Denies bothersome lower urinary tract symptoms.  He does have mild urgency but not bothersome enough that he desires additional treatment.   PMH: Past Medical History:  Diagnosis Date  . Arthritis   . Dizziness and giddiness   . Dysrhythmia   . GERD (gastroesophageal reflux disease)   . Gout   . History of NICM    a. Prev EF as low as 35-40%;  b. 03/2012 Echo: EF 50-55%, Gr 1 DD, mild MR;  c. 06/2014 Echo: EF 50-55%, gr 1 DD, mild MR, mildly dil LA.  [name] History of systolic CHF    a. Prev EF as low as 35-40%;  b. 03/2012 Echo: EF 50-55%, Gr 1 DD, mild MR;  c. 06/2014 Echo: EF 50-55%, gr 1 DD, mild MR, mildly dil LA.  [name] Hypothyroidism   . Non-obstructive CAD    a. 2009 nonobs dzs, LAD 20, EF 40-45%;  b. 07/2012 Ex MV: EF 55%, freq pvc's, no ischemia/infarct.  [name] PAF (paroxysmal atrial fibrillation) (HCC)    a. 11/2013 - occurred 3 wks post-op Appe in setting of pelvic abscess-->amio-->converted but amio later d/c'd 2/2 hypothyroidism.  . Paroxysmal supraventricular tachycardia (HCC)    a. SVT/PAT  . Prostate enlargement   . PSVT (paroxysmal supraventricular  tachycardia) (HCC)    a. 11/2013.  [name] PVC's (premature ventricular contractions)    a. Asymptomatic - managed with Toprol and Mg.  . Sciatica of right side     Surgical History: Past Surgical History:  Procedure Laterality Date  . ABSCESS DRAINAGE    . APPENDECTOMY  2015  . BACK SURGERY    . CARDIAC CATHETERIZATION  2009  . CATARACT EXTRACTION Right   . CATARACT EXTRACTION W/PHACO Right 10/17/2017   Procedure: CATARACT EXTRACTION PHACO AND INTRAOCULAR LENS PLACEMENT (IOC) RIGHT;  Surgeon: [name], MD;  Location: [facility];  Service: Ophthalmology;  Laterality: Right;  . CATARACT EXTRACTION W/PHACO Left 12/24/2017   Procedure: CATARACT EXTRACTION PHACO AND INTRAOCULAR LENS PLACEMENT (IOC) LEFT;  Surgeon: [name], MD;  Location: [facility];  Service: Ophthalmology;  Laterality: Left;  . COLONOSCOPY    . COLONOSCOPY WITH PROPOFOL N/A 11/06/2018   Procedure: COLONOSCOPY WITH PROPOFOL;  Surgeon: Toledo, Boykin Nearing, MD;  Location: [facility];  Service: Gastroenterology;  Laterality: N/A;  . DUPUYTREN / PALMAR FASCIOTOMY Bilateral   . 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 HAND SURGERY  1999  . JOINT REPLACEMENT Right 2012   TKR  . PARTIAL KNEE ARTHROPLASTY Left   . TOTAL KNEE ARTHROPLASTY  Right 06/17/2017    Home Medications:  Allergies as of 10/08/2019   No Known Allergies     Medication List       Accurate as of October 08, 2019  9:43 AM. If you have any questions, ask your nurse or doctor.        STOP taking these medications   dutasteride 0.5 MG capsule Commonly known as: AVODART Stopped by: Abbie Sons, MD     TAKE these medications   acetaminophen 500 MG tablet Commonly known as: TYLENOL Take 1,000 mg by mouth daily as needed for moderate pain  or headache.   amoxicillin 500 MG tablet Commonly known as: AMOXIL Take 2,000 mg as needed by mouth (before dental procedures).   BIOTIN PO Take 1 tablet by mouth daily.   diclofenac 75 MG EC tablet Commonly known as: VOLTAREN Take 75 mg by mouth 2 (two) times daily.   ferrous sulfate 325 (65 FE) MG tablet Take 325 mg by mouth daily with breakfast.   finasteride 5 MG tablet Commonly known as: PROSCAR Take 1 tablet (5 mg total) by mouth daily.   Fish Oil 1200 MG Caps Take 1,200 mg by mouth 2 (two) times daily.   Fluzone High-Dose 0.5 ML injection Generic drug: Influenza vac split quadrivalent PF Fluzone High-Dose 2019-20 (PF) 180 mcg/0.5 mL intramuscular syringe  TO BE ADMINISTERED BY PHARMACIST FOR IMMUNIZATION   Fluzone High-Dose Quadrivalent 0.7 ML Susy Generic drug: Influenza Vac High-Dose Quad Fluzone High-Dose Quad 2020-21 (PF) 240 mcg/0.7 mL IM syringe  TO BE ADMINISTERED BY PHARMACIST FOR IMMUNIZATION   gabapentin 300 MG capsule Commonly known as: NEURONTIN Take 300 mg by mouth 2 (two) times daily.   HYDROcodone-acetaminophen 7.5-325 MG tablet Commonly known as: Norco Take 1 tablet by mouth every 6 (six) hours as needed for moderate pain.   levothyroxine 25 MCG tablet Commonly known as: SYNTHROID Take 25 mcg by mouth daily before breakfast.   LUBRICATING EYE DROPS OP Apply 1 drop to eye 2 (two) times daily as needed (dry eyes).   Magnesium Oxide 400 (240 Mg) MG Tabs Take 1 tablet by mouth 2 (two) times daily.   magnesium oxide 400 MG tablet Commonly known as: MAG-OX Take by mouth.   metoprolol succinate 50 MG 24 hr tablet Commonly known as: TOPROL-XL Take 1 tablet (50 mg total) by mouth daily. PLEASE CALL [phone] TO SCHEDULE APPOINTMENT FOR FURTHER REFILLS.   Multi-Vitamin tablet Take by mouth.   naproxen 250 MG tablet Commonly known as: NAPROSYN Take 220 mg by mouth 2 (two) times daily with a meal.   pantoprazole 40 MG tablet Commonly  known as: PROTONIX Take 40 mg by mouth daily.   rosuvastatin 5 MG tablet Commonly known as: CRESTOR Take by mouth.   tamsulosin 0.4 MG Caps capsule Commonly known as: FLOMAX Take 1 capsule (0.4 mg total) by mouth daily.   vitamin B-12 1000 MCG tablet Commonly known as: CYANOCOBALAMIN Take by mouth.       Allergies: No Known Allergies  Family History: Family History  Problem Relation Age of Onset  . Heart attack Father 67  . Coronary artery disease Other        family hx  . Heart failure Other        family hx - CHF    Social History:  reports that he has never smoked. He has never used smokeless tobacco. He reports that he does not drink alcohol or use drugs.  ROS: UROLOGY Frequent Urination?: No  Hard to postpone urination?: Yes Burning/pain with urination?: No Get up at night to urinate?: Yes Leakage of urine?: No Urine stream starts and stops?: No Trouble starting stream?: No Do you have to strain to urinate?: No Blood in urine?: No Urinary tract infection?: No Sexually transmitted disease?: No Injury to kidneys or bladder?: No Painful intercourse?: No Weak stream?: No Erection problems?: No Penile pain?: No  Gastrointestinal Nausea?: No Vomiting?: No Indigestion/heartburn?: No Diarrhea?: No Constipation?: No  Constitutional Fever: No Night sweats?: No Weight loss?: No Fatigue?: No  Skin Skin rash/lesions?: No Itching?: No  Eyes Blurred vision?: No Double vision?: No  Ears/Nose/Throat Sore throat?: No Sinus problems?: Yes  Hematologic/Lymphatic Swollen glands?: No Easy bruising?: Yes  Cardiovascular Leg swelling?: No Chest pain?: No  Respiratory Cough?: No Shortness of breath?: No  Endocrine Excessive thirst?: No  Musculoskeletal Back pain?: No Joint pain?: Yes  Neurological Headaches?: No Dizziness?: No  Psychologic Depression?: No Anxiety?: No  Physical Exam: BP 114/71 (BP Location: Left Arm, Patient Position:  Sitting, Cuff Size: Normal)   Pulse 68   Ht 5\' 11"  (1.803 m)   Wt 220 lb (99.8 kg)   BMI 30.68 kg/m   Constitutional:  Alert and oriented, No acute distress. HEENT: [place] AT, moist mucus membranes.  Trachea midline, no masses. Cardiovascular: No clubbing, cyanosis, or edema. Respiratory: Normal respiratory effort, no increased work of breathing. GU: Prostate 40 g, smooth without nodules Skin: No rashes, bruises or suspicious lesions. Neurologic: Grossly intact, no focal deficits, moving all 4 extremities. Psychiatric: Normal mood and affect.   Assessment & Plan:    - Benign prostatic hyperplasia with urinary frequency Stable lower urinary tract symptoms on finasteride/tamsulosin.  Medications were refilled.  Some urinary urgency but not bothersome enough that he desires additional treatment.  Continue annual follow-up.   [name], MD  [facility] [address] [place], [place] [address] [phone]

## 2019-10-09 ENCOUNTER — Telehealth: Payer: Self-pay | Admitting: Cardiovascular Disease

## 2019-10-09 DIAGNOSIS — R002 Palpitations: Secondary | ICD-10-CM

## 2019-10-09 NOTE — Telephone Encounter (Signed)
DPR on file. Spoke with the patient's wife.  Mrs. Harrison sts that for the last couple weeks the patient has been having increased palpitations at night. Patient denies associating symptoms. No chest pain/tightness, sob, dizziness, light headiness, pre-syncope or syncope. Patient only notices the palpations at night.  He checks his BP and HR regularly. Most recent readings 09/28/19 108/70 57bpm 10/03/19 107/77 58bpm Patients HR is always in the high 50's to low 60's bpm.  He is currently taking Metoprolol succinate 50mg  qd. He is scheduled to see Ignacia Bayley, NP on 10/24/19. Patient would like to know if Dr. Fletcher Anon has any recommendations in the interim. Advised the patient's wife that I will fwd an update to Dr. Fletcher Anon and call back with his recommendation.

## 2019-10-09 NOTE — Telephone Encounter (Addendum)
Patient made aware or Dr. Fletcher Anon recommendation. Patient is agreeable with wearing the 14 day zio. Advised the patient that I will place the order for the zio to be mailed to his home. Patient will wear it for 14 days and then return the monitor in the postage paid box provided.  Advised the patient to expect a call from the monitor company to verify his mailing address. Patients 10/24/19 appt rescheduled with Ignacia Bayley, NP for 11/11/19 after the monitor is completed.  Patient is currently asymptomatic. Advised the patient to contact the office in the interim if cardiac symptoms develop.  Patient is agreeable with the plan and voiced appreciation for the assistance.

## 2019-10-09 NOTE — Telephone Encounter (Signed)
Patient returning call.   Confirmed Mailing address is correct in Epic while on call.   Please call.

## 2019-10-09 NOTE — Telephone Encounter (Signed)
Called to give the patient Dr. Arida's recommendation. [email or web address]. 

## 2019-10-09 NOTE — Telephone Encounter (Signed)
Patient c/o Palpitations:  High priority if patient c/o lightheadedness, shortness of breath, or chest pain  1) How long have you had palpitations/irregular HR/ Afib? Are you having the symptoms now?  Several Weeks . No   2) Are you currently experiencing lightheadedness, SOB or CP? NO mostly at night when going to bed   3) Do you have a history of afib (atrial fibrillation) or irregular heart rhythm? Yes   4) Have you checked your BP or HR? (document readings if available):     Yes, normal the same as always   No specific numbers       5) Are you experiencing any other symptoms?  Almost every night can feel palpitations   Scheduled 12/4 with Sharolyn Douglas  On waitlist .  Per wife request to look at medication adjustments.

## 2019-10-09 NOTE — Telephone Encounter (Signed)
I recommend a 2-week Zio patch monitor

## 2019-10-13 ENCOUNTER — Other Ambulatory Visit: Payer: Self-pay | Admitting: Cardiovascular Disease

## 2019-10-13 ENCOUNTER — Other Ambulatory Visit: Payer: Self-pay | Admitting: Urology

## 2019-10-13 DIAGNOSIS — R972 Elevated prostate specific antigen [PSA]: Secondary | ICD-10-CM

## 2019-10-13 DIAGNOSIS — N401 Enlarged prostate with lower urinary tract symptoms: Secondary | ICD-10-CM

## 2019-10-15 ENCOUNTER — Ambulatory Visit (INDEPENDENT_AMBULATORY_CARE_PROVIDER_SITE_OTHER): Payer: Medicare Other

## 2019-10-15 DIAGNOSIS — R002 Palpitations: Secondary | ICD-10-CM | POA: Diagnosis not present

## 2019-10-24 ENCOUNTER — Ambulatory Visit: Payer: Medicare Other | Admitting: Nurse Practitioner

## 2019-11-10 ENCOUNTER — Telehealth: Payer: Self-pay

## 2019-11-10 NOTE — Telephone Encounter (Signed)
Patient has a follow up appointment tomorrow to discuss his Zio monitor. I do not see in the ZioSuite yet. Please review and let me know if this patient should reschedule appointment for tomorrow.

## 2019-11-10 NOTE — Telephone Encounter (Signed)
Lmov to reschedule  

## 2019-11-11 ENCOUNTER — Encounter: Payer: Self-pay | Admitting: Nurse Practitioner

## 2019-11-11 ENCOUNTER — Ambulatory Visit: Payer: Medicare Other | Admitting: Nurse Practitioner

## 2019-11-18 ENCOUNTER — Telehealth: Payer: Self-pay

## 2019-11-18 MED ORDER — METOPROLOL SUCCINATE ER 50 MG PO TB24: ORAL_TABLET | ORAL | 6 refills | Status: DC

## 2019-11-18 NOTE — Telephone Encounter (Signed)
Attempted to schedule no ans no vm  

## 2019-11-18 NOTE — Telephone Encounter (Signed)
-----   Message from Wellington Hampshire, MD sent at 11/13/2019 10:47 AM EST ----- Please inform the patient that monitor showed frequent episodes of SVT.  Recommend changing Toprol to 50 mg in the morning and 25 mg at bedtime. Atrial fibrillation could not be completely ruled out.  I want him to be evaluated again by Dr. Caryl Comes to see if he needs ablation and/or anticoagulation.  The patient was seen by Dr. Caryl Comes in 2019.

## 2019-11-18 NOTE — Telephone Encounter (Signed)
Call to patient to discuss monitor results and POC from Dr. Fletcher Anon.   Pt verbalized understanding to inc Toprol and f/u with Dr. Caryl Comes.   Routing to scheduling to call patient for appt with Dr. Caryl Comes. Arida appt can be postponed until after EP.

## 2019-12-02 ENCOUNTER — Ambulatory Visit: Payer: Medicare Other | Admitting: Cardiovascular Disease

## 2019-12-04 ENCOUNTER — Encounter: Payer: Self-pay | Admitting: Internal Medicine

## 2019-12-04 ENCOUNTER — Other Ambulatory Visit: Payer: Self-pay

## 2019-12-04 ENCOUNTER — Ambulatory Visit (INDEPENDENT_AMBULATORY_CARE_PROVIDER_SITE_OTHER): Payer: Medicare Other | Admitting: Internal Medicine

## 2019-12-04 ENCOUNTER — Other Ambulatory Visit: Payer: Self-pay | Admitting: *Deleted

## 2019-12-04 VITALS — BP 100/60 | HR 57 | Ht 70.0 in | Wt 227.5 lb

## 2019-12-04 DIAGNOSIS — I471 Supraventricular tachycardia: Secondary | ICD-10-CM | POA: Diagnosis not present

## 2019-12-04 DIAGNOSIS — I4892 Unspecified atrial flutter: Secondary | ICD-10-CM

## 2019-12-04 DIAGNOSIS — I480 Paroxysmal atrial fibrillation: Secondary | ICD-10-CM | POA: Diagnosis not present

## 2019-12-04 DIAGNOSIS — R0600 Dyspnea, unspecified: Secondary | ICD-10-CM | POA: Diagnosis not present

## 2019-12-04 MED ORDER — METOPROLOL SUCCINATE ER 50 MG PO TB24: ORAL_TABLET | ORAL | 3 refills | Status: DC

## 2019-12-04 MED ORDER — APIXABAN 5 MG PO TABS: 5.0000 mg | ORAL_TABLET | Freq: Two times a day (BID) | ORAL | 6 refills | Status: DC

## 2019-12-04 NOTE — Patient Instructions (Signed)
Medication Instructions:  - Your physician has recommended you make the following change in your medication:   1) Start Eliquis 5 mg- take 1 tablet (5 mg) by mouth twice daily  Samples given: Eliquis 5 mg Lot: [id number] Exp: 11/2021  *If you need a refill on your cardiac medications before your next appointment, please call your pharmacy*  Lab Work: - none ordered  If you have labs (blood work) drawn today and your tests are completely normal, you will receive your results only by: [name] MyChart Message (if you have MyChart) OR . A paper copy in the mail If you have any lab test that is abnormal or we need to change your treatment, we will call you to review the results.  Testing/Procedures: -Your physician has requested that you have an echocardiogram. Echocardiography is a painless test that uses sound waves to create images of your heart. It provides your doctor with information about the size and shape of your heart and how well your heart's chambers and valves are working. This procedure takes approximately one hour. There are no restrictions for this procedure.    Follow-Up: At [facility], you and your health needs are our priority.  As part of our continuing mission to provide you with exceptional heart care, we have created designated Provider Care Teams.  These Care Teams include your primary Cardiologist (physician) and Advanced Practice Providers (APPs -  Physician Assistants and Nurse Practitioners) who all work together to provide you with the care you need, when you need it.  Your next appointment:   Pending   The format for your next appointment:   pending  Provider:   pending  Other Instructions N/a   Echocardiogram An echocardiogram is a procedure that uses painless sound waves (ultrasound) to produce an image of the heart. Images from an echocardiogram can provide important information about:  Signs of coronary artery disease (CAD).  Aneurysm detection. An  aneurysm is a weak or damaged part of an artery wall that bulges out from the normal force of blood pumping through the body.  Heart size and shape. Changes in the size or shape of the heart can be associated with certain conditions, including heart failure, aneurysm, and CAD.  Heart muscle function.  Heart valve function.  Signs of a past heart attack.  Fluid buildup around the heart.  Thickening of the heart muscle.  A tumor or infectious growth around the heart valves. Tell a health care provider about:  Any allergies you have.  All medicines you are taking, including vitamins, herbs, eye drops, creams, and over-the-counter medicines.  Any blood disorders you have.  Any surgeries you have had.  Any medical conditions you have.  Whether you are pregnant or may be pregnant. What are the risks? Generally, this is a safe procedure. However, problems may occur, including:  Allergic reaction to dye (contrast) that may be used during the procedure. What happens before the procedure? No specific preparation is needed. You may eat and drink normally. What happens during the procedure?   An IV tube may be inserted into one of your veins.  You may receive contrast through this tube. A contrast is an injection that improves the quality of the pictures from your heart.  A gel will be applied to your chest.  A wand-like tool (transducer) will be moved over your chest. The gel will help to transmit the sound waves from the transducer.  The sound waves will harmlessly bounce off of your heart  to allow the heart images to be captured in real-time motion. The images will be recorded on a computer. The procedure may vary among health care providers and hospitals. What happens after the procedure?  You may return to your normal, everyday life, including diet, activities, and medicines, unless your health care provider tells you not to do that. Summary  An echocardiogram is a  procedure that uses painless sound waves (ultrasound) to produce an image of the heart.  Images from an echocardiogram can provide important information about the size and shape of your heart, heart muscle function, heart valve function, and fluid buildup around your heart.  You do not need to do anything to prepare before this procedure. You may eat and drink normally.  After the echocardiogram is completed, you may return to your normal, everyday life, unless your health care provider tells you not to do that. This information is not intended to replace advice given to you by your health care provider. Make sure you discuss any questions you have with your health care provider. Document Revised: 02/27/2019 Document Reviewed: 12/09/2016 Elsevier Patient Education  [place].

## 2019-12-04 NOTE — Progress Notes (Signed)
Patient Care Team: [name], MD as PCP - General (Internal Medicine) Iran Ouch, MD as PCP - Cardiology (Cardiology)   HPI  Vincent [name] is a 79 y.o. male  Seen in followup for PVCs, Atrial fib-postoperative (only?), cardiomyopathy-mild  DATE TEST    9/13 Myoview   EF 60 % No ischemia  8/15 Echo    EF 50-55 %   12/18 Echo  EF 40-45%      Event recorder 2015 intermittently frequent PVCs Holter monitor 12/18 PVCs-6%; it also demonstrated narrow QRS regular tachycardia 11/20 increasing palpitations>> he was given a monitor and in response his metoprolol was increased.  He feels like he is having "more atrial fibrillation ".  Atrial fibrillation is also what he described his symptoms on during the monitor.  See below.  No chest pain, edema    Date Cr K Hgb  10/20 1.2 4.6 13.9           Thromboembolic risk factors ( age -41) for a CHADSVASc Score of 2  Records and Results Reviewed   Past Medical History:  Diagnosis Date  . Arthritis   . Dizziness and giddiness   . Dysrhythmia   . GERD (gastroesophageal reflux disease)   . Gout   . History of NICM    a. Prev EF as low as 35-40%;  b. 03/2012 Echo: EF 50-55%, Gr 1 DD, mild MR;  c. 06/2014 Echo: EF 50-55%, gr 1 DD, mild MR, mildly dil LA.  [name] History of systolic CHF    a. Prev EF as low as 35-40%;  b. 03/2012 Echo: EF 50-55%, Gr 1 DD, mild MR;  c. 06/2014 Echo: EF 50-55%, gr 1 DD, mild MR, mildly dil LA.  [name] Hypothyroidism   . Non-obstructive CAD    a. 2009 nonobs dzs, LAD 20, EF 40-45%;  b. 07/2012 Ex MV: EF 55%, freq pvc's, no ischemia/infarct.  [name] PAF (paroxysmal atrial fibrillation) (HCC)    a. 11/2013 - occurred 3 wks post-op Appe in setting of pelvic abscess-->amio-->converted but amio later d/c'd 2/2 hypothyroidism.  . Paroxysmal supraventricular tachycardia (HCC)    a. SVT/PAT  . Prostate enlargement   . PSVT (paroxysmal supraventricular tachycardia) (HCC)    a. 11/2013.  [name] PVC's  (premature ventricular contractions)    a. Asymptomatic - managed with Toprol and Mg.  . Sciatica of right side     Past Surgical History:  Procedure Laterality Date  . ABSCESS DRAINAGE    . APPENDECTOMY  2015  . BACK SURGERY    . CARDIAC CATHETERIZATION  2009  . CATARACT EXTRACTION Right   . CATARACT EXTRACTION W/PHACO Right 10/17/2017   Procedure: CATARACT EXTRACTION PHACO AND INTRAOCULAR LENS PLACEMENT (IOC) RIGHT;  Surgeon: [name], MD;  Location: [facility];  Service: Ophthalmology;  Laterality: Right;  . CATARACT EXTRACTION W/PHACO Left 12/24/2017   Procedure: CATARACT EXTRACTION PHACO AND INTRAOCULAR LENS PLACEMENT (IOC) LEFT;  Surgeon: [name], MD;  [facility]: [facility];  Service: Ophthalmology;  Laterality: Left;  . COLONOSCOPY    . COLONOSCOPY WITH PROPOFOL N/A 11/06/2018   Procedure: COLONOSCOPY WITH PROPOFOL;  Surgeon: Toledo, Boykin Nearing, MD;  Location: [facility];  Service: Gastroenterology;  Laterality: N/A;  . DUPUYTREN / PALMAR FASCIOTOMY Bilateral   . 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  HAND SURGERY  1999  . JOINT REPLACEMENT Right 2012   TKR  . PARTIAL KNEE ARTHROPLASTY Left   . TOTAL KNEE ARTHROPLASTY Right 06/17/2017    Current Meds  Medication Sig  . acetaminophen (TYLENOL) 500 MG tablet Take 1,000 mg by mouth daily as needed for moderate pain or headache.  [name] amoxicillin (AMOXIL) 500 MG tablet Take 2,000 mg as needed by mouth (before dental procedures).  [name] BIOTIN PO Take 1 tablet by mouth daily.  . Carboxymethylcellul-Glycerin (LUBRICATING EYE DROPS OP) Apply 1 drop to eye 2 (two) times daily as needed (dry eyes).  [name] diclofenac (VOLTAREN) 75 MG EC tablet Take 75 mg by mouth 2 (two) times daily.  . ferrous sulfate 325 (65 FE) MG tablet Take  325 mg by mouth daily with breakfast.  . finasteride (PROSCAR) 5 MG tablet Take 1 tablet (5 mg total) by mouth daily.  [name] gabapentin (NEURONTIN) 300 MG capsule Take 300 mg by mouth 2 (two) times daily.   [name] HYDROcodone-acetaminophen (NORCO) 7.5-325 MG tablet Take 1 tablet by mouth every 6 (six) hours as needed for moderate pain.  . Influenza Vac High-Dose Quad (FLUZONE HIGH-DOSE QUADRIVALENT) 0.7 ML SUSY Fluzone High-Dose Quad 2020-21 (PF) 240 mcg/0.7 mL IM syringe  TO BE ADMINISTERED BY PHARMACIST FOR IMMUNIZATION  . Influenza vac split quadrivalent PF (FLUZONE HIGH-DOSE) 0.5 ML injection Fluzone High-Dose 2019-20 (PF) 180 mcg/0.5 mL intramuscular syringe  TO BE ADMINISTERED BY PHARMACIST FOR IMMUNIZATION  . levothyroxine (SYNTHROID, LEVOTHROID) 25 MCG tablet Take 25 mcg by mouth daily before breakfast.  . magnesium oxide (MAG-OX) 400 MG tablet Take by mouth.  . Magnesium Oxide 400 (240 MG) MG TABS Take 1 tablet by mouth 2 (two) times daily.  . metoprolol succinate (TOPROL-XL) 50 MG 24 hr tablet Take 1 tablet (50 mg total) in the morning and 0.5 tablet (25 mg total) at bedtime.  . Multiple Vitamin (MULTI-VITAMIN) tablet Take by mouth.  . naproxen (NAPROSYN) 250 MG tablet Take 220 mg by mouth 2 (two) times daily with a meal.  . Omega-3 Fatty Acids (FISH OIL) 1200 MG CAPS Take 1,200 mg by mouth 2 (two) times daily.  . pantoprazole (PROTONIX) 40 MG tablet Take 40 mg by mouth daily.  . rosuvastatin (CRESTOR) 5 MG tablet Take by mouth.  . tamsulosin (FLOMAX) 0.4 MG CAPS capsule Take 1 capsule (0.4 mg total) by mouth daily.  . vitamin B-12 (CYANOCOBALAMIN) 1000 MCG tablet Take by mouth.    No Known Allergies    Review of Systems negative except from HPI and PMH  Physical Exam BP 100/60 (BP Location: Left Arm, Patient Position: Sitting, Cuff Size: Normal)   Pulse (!) 57   Ht 5\' 10"  (1.778 m)   Wt 227 lb 8 oz (103.2 kg)   BMI 32.64 kg/m  Well developed and well nourished in no acute  distress HENT normal E scleral and icterus clear Neck Supple JVP flat; carotids brisk and full Clear to ausculation  Regular rate and rhythm, no murmurs gallops or rub Soft with active bowel sounds No clubbing cyanosis  Edema Alert and oriented, grossly normal motor and sensory function Skin Warm and Dry  ECG sinus at 58 Intervals 19/08/47  CrCl cannot be calculated (Patient's most recent lab result is older than the maximum 21 days allowed.).  Event recorder 12/20 Personally reviewed  >> he had recurrent abrupt onset offset tachycardia.  Heart rates are typically in the 130 range.  There is some variability.  There is also one  strip that shows atrial flutter.  Unfortunately the flutter cycle length and the tachycardia cycle length do not correspond.  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  A strip shortly shows a flutter waves; however, more rapid tachycardias are not a related cycle length and are variable in cycle length neither of which I would expect with atrial flutter; it is suggested there may be a concomitant SVT.  Data from years ago showed a concurring association of atrial flutter and AV nodal reentry.  Given the atrial flutter and his CHA2DS2-VASc score of 2 we will begin him on anticoagulation.  We have reviewed risks and benefits including catastrophic bleeding.  With more dyspnea on exertion and the frequent tachycardia we will repeat his echocardiogram  I will review his tracings tomorrow with Dr. Lovena Le to consider EP study and catheter ablation as a diagnostic tool and primary therapy    Current medicines are reviewed at length with the patient today .  The patient does not  have concerns regarding medicines.  More than 50% of 45 min was spent in counseling related to the above

## 2019-12-11 ENCOUNTER — Telehealth: Payer: Self-pay | Admitting: Internal Medicine

## 2019-12-11 NOTE — Telephone Encounter (Signed)
I spoke with the patient to advise him that Dr. [name] and Dr. Ladona Ridgel have spoken about his SVT and that Dr. Ladona Ridgel would like to see him in the office to discuss this further.  The patient is aware that he should be receiving a call from Dr. Lubertha Basque scheduler to arrange. I advised it would be good to go ahead and get his echo done prior to that appt. This is currently scheduled for 12/17/19.  The patient voices understanding of the above and is agreeable.

## 2019-12-17 ENCOUNTER — Other Ambulatory Visit: Payer: Self-pay

## 2019-12-17 ENCOUNTER — Ambulatory Visit (INDEPENDENT_AMBULATORY_CARE_PROVIDER_SITE_OTHER): Payer: Medicare Other

## 2019-12-17 DIAGNOSIS — R0600 Dyspnea, unspecified: Secondary | ICD-10-CM

## 2019-12-17 MED ORDER — PERFLUTREN LIPID MICROSPHERE
1.0000 mL | INTRAVENOUS | Status: AC | PRN
  Administered 2019-12-17: 2 mL via INTRAVENOUS

## 2019-12-25 ENCOUNTER — Encounter: Payer: Self-pay | Admitting: Internal Medicine

## 2019-12-25 ENCOUNTER — Ambulatory Visit (INDEPENDENT_AMBULATORY_CARE_PROVIDER_SITE_OTHER): Payer: Medicare Other | Admitting: Internal Medicine

## 2019-12-25 ENCOUNTER — Other Ambulatory Visit: Payer: Self-pay

## 2019-12-25 VITALS — BP 110/78 | HR 73 | Ht 70.0 in | Wt 228.8 lb

## 2019-12-25 DIAGNOSIS — I471 Supraventricular tachycardia: Secondary | ICD-10-CM

## 2019-12-25 NOTE — Progress Notes (Signed)
HPI Vincent [name] is referred today by Dr. Renaldo Reel for evaluation of SVT and atrial flutter. He is a pleasant 79 yo man with a h/o PAF who was noted to have recurrent palpitations. He wore a cardiac monitor which demonstrated both SVT and atrial flutter. He has been on systemic anti-coagulation due to brief episodes of atrial fib. He has mainly had SVT based on his cardiac monitor. He denies chest pain, or sob but does note fatigue at times. He has not been able to play as much golf lately but thinks his game not too good lately.  No Known Allergies   Current Outpatient Medications  Medication Sig Dispense Refill  . acetaminophen (TYLENOL) 500 MG tablet Take 1,000 mg by mouth daily as needed for moderate pain or headache.    [name] amoxicillin (AMOXIL) 500 MG tablet Take 2,000 mg as needed by mouth (before dental procedures).    [name] apixaban (ELIQUIS) 5 MG TABS tablet Take 1 tablet (5 mg total) by mouth 2 (two) times daily. 60 tablet 6  . BIOTIN PO Take 1 tablet by mouth daily.    . Carboxymethylcellul-Glycerin (LUBRICATING EYE DROPS OP) Apply 1 drop to eye 2 (two) times daily as needed (dry eyes).    [name] diclofenac (VOLTAREN) 75 MG EC tablet Take 75 mg by mouth 2 (two) times daily.    . ferrous sulfate 325 (65 FE) MG tablet Take 325 mg by mouth daily with breakfast.    . finasteride (PROSCAR) 5 MG tablet Take 1 tablet (5 mg total) by mouth daily. 90 tablet 3  . gabapentin (NEURONTIN) 300 MG capsule Take 300 mg by mouth 2 (two) times daily.     [name] HYDROcodone-acetaminophen (NORCO) 7.5-325 MG tablet Take 1 tablet by mouth every 6 (six) hours as needed for moderate pain. 50 tablet 0  . Influenza Vac High-Dose Quad (FLUZONE HIGH-DOSE QUADRIVALENT) 0.7 ML SUSY Fluzone High-Dose Quad 2020-21 (PF) 240 mcg/0.7 mL IM syringe  TO BE ADMINISTERED BY PHARMACIST FOR IMMUNIZATION    . Influenza vac split quadrivalent PF (FLUZONE HIGH-DOSE) 0.5 ML injection Fluzone High-Dose 2019-20 (PF) 180 mcg/0.5 mL intramuscular  syringe  TO BE ADMINISTERED BY PHARMACIST FOR IMMUNIZATION    . levothyroxine (SYNTHROID, LEVOTHROID) 25 MCG tablet Take 25 mcg by mouth daily before breakfast.    . magnesium oxide (MAG-OX) 400 MG tablet Take by mouth.    . Magnesium Oxide 400 (240 MG) MG TABS Take 1 tablet by mouth 2 (two) times daily. 60 tablet 6  . metoprolol succinate (TOPROL-XL) 50 MG 24 hr tablet Take 1 tablet (50 mg total) in the morning and 0.5 tablet (25 mg total) at bedtime. 135 tablet 3  . Multiple Vitamin (MULTI-VITAMIN) tablet Take by mouth.    . naproxen (NAPROSYN) 250 MG tablet Take 220 mg by mouth 2 (two) times daily with a meal.    . Omega-3 Fatty Acids (FISH OIL) 1200 MG CAPS Take 1,200 mg by mouth 2 (two) times daily.    . pantoprazole (PROTONIX) 40 MG tablet Take 40 mg by mouth daily.    . rosuvastatin (CRESTOR) 5 MG tablet Take by mouth.    . tamsulosin (FLOMAX) 0.4 MG CAPS capsule Take 1 capsule (0.4 mg total) by mouth daily. 90 capsule 3  . vitamin B-12 (CYANOCOBALAMIN) 1000 MCG tablet Take by mouth.     No current facility-administered medications for this visit.     Past Medical History:  Diagnosis Date  . Arthritis   .  Dizziness and giddiness   . Dysrhythmia   . GERD (gastroesophageal reflux disease)   . Gout   . History of NICM    a. Prev EF as low as 35-40%;  b. 03/2012 Echo: EF 50-55%, Gr 1 DD, mild MR;  c. 06/2014 Echo: EF 50-55%, gr 1 DD, mild MR, mildly dil LA.  [name] History of systolic CHF    a. Prev EF as low as 35-40%;  b. 03/2012 Echo: EF 50-55%, Gr 1 DD, mild MR;  c. 06/2014 Echo: EF 50-55%, gr 1 DD, mild MR, mildly dil LA.  [name] Hypothyroidism   . Non-obstructive CAD    a. 2009 nonobs dzs, LAD 20, EF 40-45%;  b. 07/2012 Ex MV: EF 55%, freq pvc's, no ischemia/infarct.  [name] PAF (paroxysmal atrial fibrillation) (HCC)    a. 11/2013 - occurred 3 wks post-op Appe in setting of pelvic abscess-->amio-->converted but amio later d/c'd 2/2 hypothyroidism.  . Paroxysmal supraventricular tachycardia  (HCC)    a. SVT/PAT  . Prostate enlargement   . PSVT (paroxysmal supraventricular tachycardia) (HCC)    a. 11/2013.  [name] PVC's (premature ventricular contractions)    a. Asymptomatic - managed with Toprol and Mg.  . Sciatica of right side     ROS:   All systems reviewed and negative except as noted in the HPI.   Past Surgical History:  Procedure Laterality Date  . ABSCESS DRAINAGE    . APPENDECTOMY  2015  . BACK SURGERY    . CARDIAC CATHETERIZATION  2009  . CATARACT EXTRACTION Right   . CATARACT EXTRACTION W/PHACO Right 10/17/2017   Procedure: CATARACT EXTRACTION PHACO AND INTRAOCULAR LENS PLACEMENT (IOC) RIGHT;  Surgeon: [name], MD;  Location: [facility];  Service: Ophthalmology;  Laterality: Right;  . CATARACT EXTRACTION W/PHACO Left 12/24/2017   Procedure: CATARACT EXTRACTION PHACO AND INTRAOCULAR LENS PLACEMENT (IOC) LEFT;  Surgeon: [name], MD;  Location: [facility];  Service: Ophthalmology;  Laterality: Left;  . COLONOSCOPY    . COLONOSCOPY WITH PROPOFOL N/A 11/06/2018   Procedure: COLONOSCOPY WITH PROPOFOL;  Surgeon: Toledo, Boykin Nearing, MD;  Location: [facility];  Service: Gastroenterology;  Laterality: N/A;  . DUPUYTREN / PALMAR FASCIOTOMY Bilateral   . 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 HAND SURGERY  1999  . JOINT REPLACEMENT Right 2012   TKR  . PARTIAL KNEE ARTHROPLASTY Left   . TOTAL KNEE ARTHROPLASTY Right 06/17/2017     Family History  Problem Relation Age of Onset  . Heart attack Father 18  . Coronary artery disease Other        family hx  . Heart failure Other        family hx - CHF     Social History   Socioeconomic History  . Marital status: Married    Spouse name: Not on file  . Number of children: Not on file  . Years of  education: Not on file  . Highest education level: Not on file  Occupational History  . Not on file  Tobacco Use  . Smoking status: Never Smoker  . Smokeless tobacco: Never Used  . Tobacco comment: tobacco use - no  Substance and Sexual Activity  . Alcohol use: No  . Drug use: No  . Sexual activity: Yes    Birth control/protection: None  Other Topics Concern  .  Not on file  Social History Narrative   Retired, married, gets regular exercise.   Social Determinants of Health   Financial Resource Strain:   . Difficulty of Paying Living Expenses: Not on file  Food Insecurity:   . Worried About Programme researcher, broadcasting/film/video in the Last Year: Not on file  . Ran Out of Food in the Last Year: Not on file  Transportation Needs:   . Lack of Transportation (Medical): Not on file  . Lack of Transportation (Non-Medical): Not on file  Physical Activity:   . Days of Exercise per Week: Not on file  . Minutes of Exercise per Session: Not on file  Stress:   . Feeling of Stress : Not on file  Social Connections:   . Frequency of Communication with Friends and Family: Not on file  . Frequency of Social Gatherings with Friends and Family: Not on file  . Attends Religious Services: Not on file  . Active Member of Clubs or Organizations: Not on file  . Attends Banker Meetings: Not on file  . Marital Status: Not on file  Intimate Partner Violence:   . Fear of Current or Ex-Partner: Not on file  . Emotionally Abused: Not on file  . Physically Abused: Not on file  . Sexually Abused: Not on file     BP 110/78   Pulse 73   Ht 5\' 10"  (1.778 m)   Wt 228 lb 12.8 oz (103.8 kg)   SpO2 91%   BMI 32.83 kg/m   Physical Exam:  Well appearing NAD HEENT: Unremarkable Neck:  No JVD, no thyromegally Lymphatics:  No adenopathy Back:  No CVA tenderness Lungs:  Clear with no wheezes HEART:  Regular rate rhythm, no murmurs, no rubs, no clicks Abd:  soft, positive bowel sounds, no  organomegally, no rebound, no guarding Ext:  2 plus pulses, no edema, no cyanosis, no clubbing Skin:  No rashes no nodules Neuro:  CN II through XII intact, motor grossly intact  EKG - nsr/sinus bradycardia  Assess/Plan: 1. SVT/ Atrial flutter - I have discussed the treatment option with the patient and the risks/benefits/goals and expectations of catheter ablation were reviewed, and he will call if he wishes to proceed. 2. Coags - because he has some documented atrial fib, I would not be inclined to stop his anti-coagulation.   [place].D.

## 2019-12-25 NOTE — H&P (View-Only) (Signed)
HPI Mr. Vincent [name] is referred today by Dr. Renaldo Reel for evaluation of SVT and atrial flutter. He is a pleasant 79 yo man with a h/o PAF who was noted to have recurrent palpitations. He wore a cardiac monitor which demonstrated both SVT and atrial flutter. He has been on systemic anti-coagulation due to brief episodes of atrial fib. He has mainly had SVT based on his cardiac monitor. He denies chest pain, or sob but does note fatigue at times. He has not been able to play as much golf lately but thinks his game not too good lately.  No Known Allergies   Current Outpatient Medications  Medication Sig Dispense Refill  . acetaminophen (TYLENOL) 500 MG tablet Take 1,000 mg by mouth daily as needed for moderate pain or headache.    [name] amoxicillin (AMOXIL) 500 MG tablet Take 2,000 mg as needed by mouth (before dental procedures).    [name] apixaban (ELIQUIS) 5 MG TABS tablet Take 1 tablet (5 mg total) by mouth 2 (two) times daily. 60 tablet 6  . BIOTIN PO Take 1 tablet by mouth daily.    . Carboxymethylcellul-Glycerin (LUBRICATING EYE DROPS OP) Apply 1 drop to eye 2 (two) times daily as needed (dry eyes).    [name] diclofenac (VOLTAREN) 75 MG EC tablet Take 75 mg by mouth 2 (two) times daily.    . ferrous sulfate 325 (65 FE) MG tablet Take 325 mg by mouth daily with breakfast.    . finasteride (PROSCAR) 5 MG tablet Take 1 tablet (5 mg total) by mouth daily. 90 tablet 3  . gabapentin (NEURONTIN) 300 MG capsule Take 300 mg by mouth 2 (two) times daily.     [name] HYDROcodone-acetaminophen (NORCO) 7.5-325 MG tablet Take 1 tablet by mouth every 6 (six) hours as needed for moderate pain. 50 tablet 0  . Influenza Vac High-Dose Quad (FLUZONE HIGH-DOSE QUADRIVALENT) 0.7 ML SUSY Fluzone High-Dose Quad 2020-21 (PF) 240 mcg/0.7 mL IM syringe  TO BE ADMINISTERED BY PHARMACIST FOR IMMUNIZATION    . Influenza vac split quadrivalent PF (FLUZONE HIGH-DOSE) 0.5 ML injection Fluzone High-Dose 2019-20 (PF) 180 mcg/0.5 mL intramuscular  syringe  TO BE ADMINISTERED BY PHARMACIST FOR IMMUNIZATION    . levothyroxine (SYNTHROID, LEVOTHROID) 25 MCG tablet Take 25 mcg by mouth daily before breakfast.    . magnesium oxide (MAG-OX) 400 MG tablet Take by mouth.    . Magnesium Oxide 400 (240 MG) MG TABS Take 1 tablet by mouth 2 (two) times daily. 60 tablet 6  . metoprolol succinate (TOPROL-XL) 50 MG 24 hr tablet Take 1 tablet (50 mg total) in the morning and 0.5 tablet (25 mg total) at bedtime. 135 tablet 3  . Multiple Vitamin (MULTI-VITAMIN) tablet Take by mouth.    . naproxen (NAPROSYN) 250 MG tablet Take 220 mg by mouth 2 (two) times daily with a meal.    . Omega-3 Fatty Acids (FISH OIL) 1200 MG CAPS Take 1,200 mg by mouth 2 (two) times daily.    . pantoprazole (PROTONIX) 40 MG tablet Take 40 mg by mouth daily.    . rosuvastatin (CRESTOR) 5 MG tablet Take by mouth.    . tamsulosin (FLOMAX) 0.4 MG CAPS capsule Take 1 capsule (0.4 mg total) by mouth daily. 90 capsule 3  . vitamin B-12 (CYANOCOBALAMIN) 1000 MCG tablet Take by mouth.     No current facility-administered medications for this visit.     Past Medical History:  Diagnosis Date  . Arthritis   .  Dizziness and giddiness   . Dysrhythmia   . GERD (gastroesophageal reflux disease)   . Gout   . History of NICM    a. Prev EF as low as 35-40%;  b. 03/2012 Echo: EF 50-55%, Gr 1 DD, mild MR;  c. 06/2014 Echo: EF 50-55%, gr 1 DD, mild MR, mildly dil LA.  [name] History of systolic CHF    a. Prev EF as low as 35-40%;  b. 03/2012 Echo: EF 50-55%, Gr 1 DD, mild MR;  c. 06/2014 Echo: EF 50-55%, gr 1 DD, mild MR, mildly dil LA.  [name] Hypothyroidism   . Non-obstructive CAD    a. 2009 nonobs dzs, LAD 20, EF 40-45%;  b. 07/2012 Ex MV: EF 55%, freq pvc's, no ischemia/infarct.  [name] PAF (paroxysmal atrial fibrillation) (HCC)    a. 11/2013 - occurred 3 wks post-op Appe in setting of pelvic abscess-->amio-->converted but amio later d/c'd 2/2 hypothyroidism.  . Paroxysmal supraventricular tachycardia  (HCC)    a. SVT/PAT  . Prostate enlargement   . PSVT (paroxysmal supraventricular tachycardia) (HCC)    a. 11/2013.  [name] PVC's (premature ventricular contractions)    a. Asymptomatic - managed with Toprol and Mg.  . Sciatica of right side     ROS:   All systems reviewed and negative except as noted in the HPI.   Past Surgical History:  Procedure Laterality Date  . ABSCESS DRAINAGE    . APPENDECTOMY  2015  . BACK SURGERY    . CARDIAC CATHETERIZATION  2009  . CATARACT EXTRACTION Right   . CATARACT EXTRACTION W/PHACO Right 10/17/2017   Procedure: CATARACT EXTRACTION PHACO AND INTRAOCULAR LENS PLACEMENT (IOC) RIGHT;  Surgeon: [name], MD;  Location: [facility];  Service: Ophthalmology;  Laterality: Right;  . CATARACT EXTRACTION W/PHACO Left 12/24/2017   Procedure: CATARACT EXTRACTION PHACO AND INTRAOCULAR LENS PLACEMENT (IOC) LEFT;  Surgeon: [name], MD;  Location: [facility];  Service: Ophthalmology;  Laterality: Left;  . COLONOSCOPY    . COLONOSCOPY WITH PROPOFOL N/A 11/06/2018   Procedure: COLONOSCOPY WITH PROPOFOL;  Surgeon: Toledo, Boykin Nearing, MD;  Location: [facility];  Service: Gastroenterology;  Laterality: N/A;  . DUPUYTREN / PALMAR FASCIOTOMY Bilateral   . 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 HAND SURGERY  1999  . JOINT REPLACEMENT Right 2012   TKR  . PARTIAL KNEE ARTHROPLASTY Left   . TOTAL KNEE ARTHROPLASTY Right 06/17/2017     Family History  Problem Relation Age of Onset  . Heart attack Father 18  . Coronary artery disease Other        family hx  . Heart failure Other        family hx - CHF     Social History   Socioeconomic History  . Marital status: Married    Spouse name: Not on file  . Number of children: Not on file  . Years of  education: Not on file  . Highest education level: Not on file  Occupational History  . Not on file  Tobacco Use  . Smoking status: Never Smoker  . Smokeless tobacco: Never Used  . Tobacco comment: tobacco use - no  Substance and Sexual Activity  . Alcohol use: No  . Drug use: No  . Sexual activity: Yes    Birth control/protection: None  Other Topics Concern  .  Not on file  Social History Narrative   Retired, married, gets regular exercise.   Social Determinants of Health   Financial Resource Strain:   . Difficulty of Paying Living Expenses: Not on file  Food Insecurity:   . Worried About Running Out of Food in the Last Year: Not on file  . Ran Out of Food in the Last Year: Not on file  Transportation Needs:   . Lack of Transportation (Medical): Not on file  . Lack of Transportation (Non-Medical): Not on file  Physical Activity:   . Days of Exercise per Week: Not on file  . Minutes of Exercise per Session: Not on file  Stress:   . Feeling of Stress : Not on file  Social Connections:   . Frequency of Communication with Friends and Family: Not on file  . Frequency of Social Gatherings with Friends and Family: Not on file  . Attends Religious Services: Not on file  . Active Member of Clubs or Organizations: Not on file  . Attends Club or Organization Meetings: Not on file  . Marital Status: Not on file  Intimate Partner Violence:   . Fear of Current or Ex-Partner: Not on file  . Emotionally Abused: Not on file  . Physically Abused: Not on file  . Sexually Abused: Not on file     BP 110/78   Pulse 73   Ht 5' 10" (1.778 m)   Wt 228 lb 12.8 oz (103.8 kg)   SpO2 91%   BMI 32.83 kg/m   Physical Exam:  Well appearing NAD HEENT: Unremarkable Neck:  No JVD, no thyromegally Lymphatics:  No adenopathy Back:  No CVA tenderness Lungs:  Clear with no wheezes HEART:  Regular rate rhythm, no murmurs, no rubs, no clicks Abd:  soft, positive bowel sounds, no  organomegally, no rebound, no guarding Ext:  2 plus pulses, no edema, no cyanosis, no clubbing Skin:  No rashes no nodules Neuro:  CN II through XII intact, motor grossly intact  EKG - nsr/sinus bradycardia  Assess/Plan: 1. SVT/ Atrial flutter - I have discussed the treatment option with the patient and the risks/benefits/goals and expectations of catheter ablation were reviewed, and he will call us if he wishes to proceed. 2. Coags - because he has some documented atrial fib, I would not be inclined to stop his anti-coagulation.   Geddy Boydstun,M.D. 

## 2019-12-25 NOTE — Patient Instructions (Addendum)
Medication Instructions:  Your physician recommends that you continue on your current medications as directed. Please refer to the Current Medication list given to you today.  Labwork: You will get lab work today:  BMP and CBC  Testing/Procedures: Your physician has recommended that you have an ablation. Catheter ablation is a medical procedure used to treat some cardiac arrhythmias (irregular heartbeats). During catheter ablation, a long, thin, flexible tube is put into a blood vessel in your groin (upper thigh), or neck. This tube is called an ablation catheter. It is then guided to your heart through the blood vessel. Radio frequency waves destroy small areas of heart tissue where abnormal heartbeats may cause an arrhythmia to start. Please see the instruction sheet given to you today.  Follow-Up:  SEE INSTRUCTION LETTER  Any Other Special Instructions Will Be Listed Below (If Applicable).  If you need a refill on your cardiac medications before your next appointment, please call your pharmacy.    Cardiac Ablation Cardiac ablation is a procedure to disable (ablate) a small amount of heart tissue in very specific places. The heart has many electrical connections. Sometimes these connections are abnormal and can cause the heart to beat very fast or irregularly. Ablating some of the problem areas can improve the heart rhythm or return it to normal. Ablation may be done for people who:  Have Wolff-Parkinson-White syndrome.  Have fast heart rhythms (tachycardia).  Have taken medicines for an abnormal heart rhythm (arrhythmia) that were not effective or caused side effects.  Have a high-risk heartbeat that may be life-threatening. During the procedure, a small incision is made in the neck or the groin, and a long, thin, flexible tube (catheter) is inserted into the incision and moved to the heart. Small devices (electrodes) on the tip of the catheter will send out electrical currents. A type  of X-ray (fluoroscopy) will be used to help guide the catheter and to provide images of the heart. Tell a health care provider about:  Any allergies you have.  All medicines you are taking, including vitamins, herbs, eye drops, creams, and over-the-counter medicines.  Any problems you or family members have had with anesthetic medicines.  Any blood disorders you have.  Any surgeries you have had.  Any medical conditions you have, such as kidney failure.  Whether you are pregnant or may be pregnant. What are the risks? Generally, this is a safe procedure. However, problems may occur, including:  Infection.  Bruising and bleeding at the catheter insertion site.  Bleeding into the chest, especially into the sac that surrounds the heart. This is a serious complication.  Stroke or blood clots.  Damage to other structures or organs.  Allergic reaction to medicines or dyes.  Need for a permanent pacemaker if the normal electrical system is damaged. A pacemaker is a small computer that sends electrical signals to the heart and helps your heart beat normally.  The procedure not being fully effective. This may not be recognized until months later. Repeat ablation procedures are sometimes required. What happens before the procedure?  Follow instructions from your health care provider about eating or drinking restrictions.  Ask your health care provider about: ? Changing or stopping your regular medicines. This is especially important if you are taking diabetes medicines or blood thinners. ? Taking medicines such as aspirin and ibuprofen. These medicines can thin your blood. Do not take these medicines before your procedure if your health care provider instructs you not to.  Plan to have   someone take you home from the hospital or clinic.  If you will be going home right after the procedure, plan to have someone with you for 24 hours. What happens during the procedure?  To lower  your risk of infection: ? Your health care team will wash or sanitize their hands. ? Your skin will be washed with soap. ? Hair may be removed from the incision area.  An IV tube will be inserted into one of your veins.  You will be given a medicine to help you relax (sedative).  The skin on your neck or groin will be numbed.  An incision will be made in your neck or your groin.  A needle will be inserted through the incision and into a large vein in your neck or groin.  A catheter will be inserted into the needle and moved to your heart.  Dye may be injected through the catheter to help your surgeon see the area of the heart that needs treatment.  Electrical currents will be sent from the catheter to ablate heart tissue in desired areas. There are three types of energy that may be used to ablate heart tissue: ? Heat (radiofrequency energy). ? Laser energy. ? Extreme cold (cryoablation).  When the necessary tissue has been ablated, the catheter will be removed.  Pressure will be held on the catheter insertion area to prevent excessive bleeding.  A bandage (dressing) will be placed over the catheter insertion area. The procedure may vary among health care providers and hospitals. What happens after the procedure?  Your blood pressure, heart rate, breathing rate, and blood oxygen level will be monitored until the medicines you were given have worn off.  Your catheter insertion area will be monitored for bleeding. You will need to lie still for a few hours to ensure that you do not bleed from the catheter insertion area.  Do not drive for 24 hours or as long as directed by your health care provider. Summary  Cardiac ablation is a procedure to disable (ablate) a small amount of heart tissue in very specific places. Ablating some of the problem areas can improve the heart rhythm or return it to normal.  During the procedure, electrical currents will be sent from the catheter to  ablate heart tissue in desired areas. This information is not intended to replace advice given to you by your health care provider. Make sure you discuss any questions you have with your health care provider. Document Revised: 04/29/2018 Document Reviewed: 09/25/2016 Elsevier Patient Education  2020 Elsevier Inc.   

## 2019-12-26 ENCOUNTER — Other Ambulatory Visit: Payer: Medicare Other | Admitting: *Deleted

## 2019-12-26 ENCOUNTER — Other Ambulatory Visit: Payer: Self-pay | Admitting: *Deleted

## 2019-12-26 ENCOUNTER — Other Ambulatory Visit
Admission: RE | Admit: 2019-12-26 | Discharge: 2019-12-26 | Disposition: A | Discharge status: Home / Self Care | Payer: Medicare Other | Source: Ambulatory Visit | Attending: Internal Medicine | Admitting: Internal Medicine

## 2019-12-26 DIAGNOSIS — I471 Supraventricular tachycardia: Secondary | ICD-10-CM

## 2019-12-26 DIAGNOSIS — Z20822 Contact with and (suspected) exposure to covid-19: Secondary | ICD-10-CM | POA: Insufficient documentation

## 2019-12-26 DIAGNOSIS — Z01812 Encounter for preprocedural laboratory examination: Secondary | ICD-10-CM | POA: Diagnosis present

## 2019-12-26 LAB — CBC WITH DIFFERENTIAL/PLATELET
Basophils Absolute: 0.1 10*3/uL (ref 0.0–0.2)
Basos: 1 %
EOS (ABSOLUTE): 0.2 10*3/uL (ref 0.0–0.4)
Eos: 4 %
Hematocrit: 41.7 % (ref 37.5–51.0)
Hemoglobin: 14 g/dL (ref 13.0–17.7)
Immature Grans (Abs): 0 10*3/uL (ref 0.0–0.1)
Immature Granulocytes: 0 %
Lymphocytes Absolute: 2.2 10*3/uL (ref 0.7–3.1)
Lymphs: 37 %
MCH: 30.8 pg (ref 26.6–33.0)
MCHC: 33.6 g/dL (ref 31.5–35.7)
MCV: 92 fL (ref 79–97)
Monocytes Absolute: 0.4 10*3/uL (ref 0.1–0.9)
Monocytes: 6 %
Neutrophils Absolute: 3.1 10*3/uL (ref 1.4–7.0)
Neutrophils: 52 %
Platelets: 199 10*3/uL (ref 150–450)
RBC: 4.54 x10E6/uL (ref 4.14–5.80)
RDW: 12.1 % (ref 11.6–15.4)
WBC: 5.9 10*3/uL (ref 3.4–10.8)

## 2019-12-26 LAB — BASIC METABOLIC PANEL
BUN/Creatinine Ratio: 20 (ref 10–24)
BUN/Creatinine Ratio: 22 (ref 10–24)
BUN: 26 mg/dL (ref 8–27)
BUN: 28 mg/dL — ABNORMAL HIGH (ref 8–27)
CO2: 23 mmol/L (ref 20–29)
CO2: 29 mmol/L (ref 20–29)
Calcium: 9.4 mg/dL (ref 8.6–10.2)
Calcium: 9.7 mg/dL (ref 8.6–10.2)
Chloride: 105 mmol/L (ref 96–106)
Chloride: 104 mmol/L (ref 96–106)
Creatinine, Ser: 1.33 mg/dL — ABNORMAL HIGH (ref 0.76–1.27)
Creatinine, Ser: 1.25 mg/dL (ref 0.76–1.27)
GFR calc Af Amer: 58 mL/min/{1.73_m2} — ABNORMAL LOW (ref >59)
GFR calc Af Amer: 63 mL/min/{1.73_m2} (ref >59)
GFR calc non Af Amer: 50 mL/min/{1.73_m2} — ABNORMAL LOW (ref >59)
GFR calc non Af Amer: 54 mL/min/{1.73_m2} — ABNORMAL LOW (ref >59)
Glucose: 92 mg/dL (ref 65–99)
Glucose: 90 mg/dL (ref 65–99)
Potassium: 5.4 mmol/L — ABNORMAL HIGH (ref 3.5–5.2)
Potassium: 4.8 mmol/L (ref 3.5–5.2)
Sodium: 140 mmol/L (ref 134–144)
Sodium: 134 mmol/L (ref 134–144)

## 2019-12-26 LAB — SARS CORONAVIRUS 2 (TAT 6-24 HRS): SARS Coronavirus 2: NEGATIVE

## 2019-12-26 MED ORDER — FINASTERIDE 5 MG PO TABS: 5.0000 mg | ORAL_TABLET | Freq: Every day | ORAL | 2 refills | Status: DC

## 2019-12-26 MED ORDER — TAMSULOSIN HCL 0.4 MG PO CAPS: 0.4000 mg | ORAL_CAPSULE | Freq: Every day | ORAL | 2 refills | Status: DC

## 2019-12-29 ENCOUNTER — Encounter (HOSPITAL_COMMUNITY)
Admission: RE | Disposition: A | Discharge status: Home / Self Care | Payer: Self-pay | Source: Home / Self Care | Attending: Internal Medicine

## 2019-12-29 ENCOUNTER — Other Ambulatory Visit: Payer: Self-pay

## 2019-12-29 ENCOUNTER — Ambulatory Visit (HOSPITAL_COMMUNITY)
Admission: RE | Admit: 2019-12-29 | Discharge: 2019-12-29 | Disposition: A | Discharge status: Home / Self Care | Payer: Medicare Other | Attending: Internal Medicine | Admitting: Internal Medicine

## 2019-12-29 DIAGNOSIS — M1990 Unspecified osteoarthritis, unspecified site: Secondary | ICD-10-CM | POA: Insufficient documentation

## 2019-12-29 DIAGNOSIS — Z79890 Hormone replacement therapy (postmenopausal): Secondary | ICD-10-CM | POA: Diagnosis not present

## 2019-12-29 DIAGNOSIS — Z79899 Other long term (current) drug therapy: Secondary | ICD-10-CM | POA: Diagnosis not present

## 2019-12-29 DIAGNOSIS — Z7901 Long term (current) use of anticoagulants: Secondary | ICD-10-CM | POA: Insufficient documentation

## 2019-12-29 DIAGNOSIS — E039 Hypothyroidism, unspecified: Secondary | ICD-10-CM | POA: Insufficient documentation

## 2019-12-29 DIAGNOSIS — I471 Supraventricular tachycardia: Secondary | ICD-10-CM | POA: Diagnosis not present

## 2019-12-29 DIAGNOSIS — Z96653 Presence of artificial knee joint, bilateral: Secondary | ICD-10-CM | POA: Insufficient documentation

## 2019-12-29 DIAGNOSIS — Z8249 Family history of ischemic heart disease and other diseases of the circulatory system: Secondary | ICD-10-CM | POA: Diagnosis not present

## 2019-12-29 DIAGNOSIS — N400 Enlarged prostate without lower urinary tract symptoms: Secondary | ICD-10-CM | POA: Diagnosis not present

## 2019-12-29 DIAGNOSIS — I480 Paroxysmal atrial fibrillation: Secondary | ICD-10-CM | POA: Diagnosis not present

## 2019-12-29 DIAGNOSIS — I2510 Atherosclerotic heart disease of native coronary artery without angina pectoris: Secondary | ICD-10-CM | POA: Diagnosis not present

## 2019-12-29 DIAGNOSIS — K219 Gastro-esophageal reflux disease without esophagitis: Secondary | ICD-10-CM | POA: Diagnosis not present

## 2019-12-29 DIAGNOSIS — Z791 Long term (current) use of non-steroidal anti-inflammatories (NSAID): Secondary | ICD-10-CM | POA: Insufficient documentation

## 2019-12-29 DIAGNOSIS — I483 Typical atrial flutter: Secondary | ICD-10-CM | POA: Diagnosis not present

## 2019-12-29 HISTORY — PX: SVT ABLATION: EP1225

## 2019-12-29 SURGERY — SVT ABLATION

## 2019-12-29 MED ORDER — FENTANYL CITRATE (PF) 100 MCG/2ML IJ SOLN
INTRAMUSCULAR | Status: AC
  Filled 2019-12-29: qty 2

## 2019-12-29 MED ORDER — SODIUM CHLORIDE 0.9 % IV SOLN: 250.0000 mL | INTRAVENOUS | Status: DC | PRN

## 2019-12-29 MED ORDER — HEPARIN (PORCINE) IN NACL 1000-0.9 UT/500ML-% IV SOLN
INTRAVENOUS | Status: DC | PRN
  Administered 2019-12-29: 500 mL

## 2019-12-29 MED ORDER — ONDANSETRON HCL 4 MG/2ML IJ SOLN: 4.0000 mg | Freq: Four times a day (QID) | INTRAMUSCULAR | Status: DC | PRN

## 2019-12-29 MED ORDER — ACETAMINOPHEN 325 MG PO TABS
650.0000 mg | ORAL_TABLET | ORAL | Status: DC | PRN
  Filled 2019-12-29: qty 2

## 2019-12-29 MED ORDER — MIDAZOLAM HCL 5 MG/5ML IJ SOLN
INTRAMUSCULAR | Status: AC
  Filled 2019-12-29: qty 5

## 2019-12-29 MED ORDER — SODIUM CHLORIDE 0.9% FLUSH: 3.0000 mL | Freq: Two times a day (BID) | INTRAVENOUS | Status: DC

## 2019-12-29 MED ORDER — SODIUM CHLORIDE 0.9% FLUSH: 3.0000 mL | INTRAVENOUS | Status: DC | PRN

## 2019-12-29 MED ORDER — MIDAZOLAM HCL 5 MG/5ML IJ SOLN
INTRAMUSCULAR | Status: DC | PRN
  Administered 2019-12-29: 1 mg via INTRAVENOUS
  Administered 2019-12-29: 2 mg via INTRAVENOUS
  Administered 2019-12-29: 1 mg via INTRAVENOUS
  Administered 2019-12-29 (×2): 2 mg via INTRAVENOUS
  Administered 2019-12-29: 1 mg via INTRAVENOUS

## 2019-12-29 MED ORDER — SODIUM CHLORIDE 0.9 % IV SOLN: INTRAVENOUS | Status: DC

## 2019-12-29 MED ORDER — BUPIVACAINE HCL (PF) 0.25 % IJ SOLN
INTRAMUSCULAR | Status: AC
  Filled 2019-12-29: qty 60

## 2019-12-29 MED ORDER — BUPIVACAINE HCL (PF) 0.25 % IJ SOLN
INTRAMUSCULAR | Status: DC | PRN
  Administered 2019-12-29: 60 mL

## 2019-12-29 MED ORDER — FENTANYL CITRATE (PF) 100 MCG/2ML IJ SOLN
INTRAMUSCULAR | Status: DC | PRN
  Administered 2019-12-29: 25 ug via INTRAVENOUS
  Administered 2019-12-29 (×2): 12.5 ug via INTRAVENOUS
  Administered 2019-12-29 (×2): 25 ug via INTRAVENOUS

## 2019-12-29 MED ORDER — BUPIVACAINE HCL (PF) 0.25 % IJ SOLN
INTRAMUSCULAR | Status: AC
  Filled 2019-12-29: qty 30

## 2019-12-29 MED ORDER — HEPARIN (PORCINE) IN NACL 1000-0.9 UT/500ML-% IV SOLN
INTRAVENOUS | Status: AC
  Filled 2019-12-29: qty 500

## 2019-12-29 MED ORDER — HEPARIN SODIUM (PORCINE) 1000 UNIT/ML IJ SOLN
INTRAMUSCULAR | Status: DC | PRN
  Administered 2019-12-29: 1000 [IU] via INTRAVENOUS

## 2019-12-29 MED ORDER — HEPARIN SODIUM (PORCINE) 1000 UNIT/ML IJ SOLN
INTRAMUSCULAR | Status: AC
  Filled 2019-12-29: qty 1

## 2019-12-29 SURGICAL SUPPLY — 13 items
CATH HEX JOS 2-5-2 65CM 6F REP (CATHETERS) ×2 IMPLANT
CATH JOSEPH QUAD ALLRED 6F REP (CATHETERS) ×4 IMPLANT
CATH SMTCH THERMOCOOL SF FJ (CATHETERS) ×2 IMPLANT
COVER DOME SNAP 22 D (MISCELLANEOUS) ×2 IMPLANT
PACK EP LATEX FREE (CUSTOM PROCEDURE TRAY) ×1
PACK EP LF (CUSTOM PROCEDURE TRAY) ×1 IMPLANT
PAD PRO RADIOLUCENT 2001M-C (PAD) ×2 IMPLANT
PATCH CARTO3 (PAD) ×2 IMPLANT
SHEATH PINNACLE 6F 10CM (SHEATH) ×4 IMPLANT
SHEATH PINNACLE 7F 10CM (SHEATH) ×2 IMPLANT
SHEATH PINNACLE 8F 10CM (SHEATH) ×2 IMPLANT
SHIELD RADPAD SCOOP 12X17 (MISCELLANEOUS) ×2 IMPLANT
TUBING SMART ABLATE COOLFLOW (TUBING) ×2 IMPLANT

## 2019-12-29 NOTE — Progress Notes (Signed)
Site area: Right Internal Jugular  A 7 french venous sheath was removed  Site Prior to Removal:  Level 0  Pressure Applied For 10 MINUTES    Bedrest Beginning at 1045am  Manual:   Yes.    Patient Status During Pull:  stable  Post Pull Groin Site:  Level 0  Post Pull Instructions Given:  Yes.    Post Pull Pulses Present:  Yes.    Dressing Applied:  Yes.    Comments:

## 2019-12-29 NOTE — Progress Notes (Signed)
Discharge instructions reviewed with pt and his wife (via telephone) both voice understanding.  

## 2019-12-29 NOTE — Interval H&P Note (Signed)
History and Physical Interval Note:  12/29/2019 7:27 AM  Vincent [name]  has presented today for surgery, with the diagnosis of SVT.  The various methods of treatment have been discussed with the patient and family. After consideration of risks, benefits and other options for treatment, the patient has consented to  Procedure(s): SVT ABLATION (N/A) as a surgical intervention.  The patient's history has been reviewed, patient examined, no change in status, stable for surgery.  I have reviewed the patient's chart and labs.  Questions were answered to the patient's satisfaction.     Lewayne Bunting

## 2019-12-29 NOTE — Progress Notes (Addendum)
Site area: Right groin a 6 french venous sheath X2 and a 8 french venous sheath was removed By Sheffield Slider RN  Site Prior to Removal:  Level 0  Pressure Applied For 15 MINUTES    Bedrest Beginning at  1045am  Manual:   Yes.    Patient Status During Pull:  stable  Post Pull Groin Site:  Level 0  Post Pull Instructions Given:  Yes.    Post Pull Pulses Present:  Yes.    Dressing Applied:  Yes.    Comments:

## 2019-12-29 NOTE — Discharge Instructions (Signed)
Moderate Conscious Sedation, Adult, Care After These instructions provide you with information about caring for yourself after your procedure. Your health care provider may also give you more specific instructions. Your treatment has been planned according to current medical practices, but problems sometimes occur. Call your health care provider if you have any problems or questions after your procedure. What can I expect after the procedure? After your procedure, it is common:  To feel sleepy for several hours.  To feel clumsy and have poor balance for several hours.  To have poor judgment for several hours.  To vomit if you eat too soon. Follow these instructions at home: For at least 24 hours after the procedure:   Do not: ? Participate in activities where you could fall or become injured. ? Drive. ? Use heavy machinery. ? Drink alcohol. ? Take sleeping pills or medicines that cause drowsiness. ? Make important decisions or sign legal documents. ? Take care of children on your own.  Rest. Eating and drinking  Follow the diet recommended by your health care provider.  If you vomit: ? Drink water, juice, or soup when you can drink without vomiting. ? Make sure you have little or no nausea before eating solid foods. General instructions  Have a responsible adult stay with you until you are awake and alert.  Take over-the-counter and prescription medicines only as told by your health care provider.  If you smoke, do not smoke without supervision.  Keep all follow-up visits as told by your health care provider. This is important. Contact a health care provider if:  You keep feeling nauseous or you keep vomiting.  You feel light-headed.  You develop a rash.  You have a fever. Get help right away if:  You have trouble breathing. This information is not intended to replace advice given to you by your health care provider. Make sure you discuss any questions you have  with your health care provider. Document Revised: 10/19/2017 Document Reviewed: 02/26/2016 Elsevier Patient Education  [place] After This sheet gives you information about how to care for yourself after your procedure. Your health care provider may also give you more specific instructions. If you have problems or questions, contact your health care provider. What can I expect after the procedure? After the procedure, it is common to have:  Bruising around your puncture site.  Tenderness around your puncture site.  Skipped heartbeats.  Tiredness (fatigue). Follow these instructions at home: Puncture site care   Follow instructions from your health care provider about how to take care of your puncture site. Make sure you: ? Wash your hands with soap and water before you change your bandage (dressing). If soap and water are not available, use hand sanitizer. ? Change your dressing as told by your health care provider. ? Leave stitches (sutures), skin glue, or adhesive strips in place. These skin closures may need to stay in place for up to 2 weeks. If adhesive strip edges start to loosen and curl up, you may trim the loose edges. Do not remove adhesive strips completely unless your health care provider tells you to do that.  Check your puncture site every day for signs of infection. Check for: ? Redness, swelling, or pain. ? Fluid or blood. If your puncture site starts to bleed, lie down on your back, apply firm pressure to the area, and contact your health care provider. ? Warmth. ? Pus or a bad smell. Driving  Ask your  health care provider when it is safe for you to drive again after the procedure.  Do not drive or use heavy machinery while taking prescription pain medicine.  Do not drive for 24 hours if you were given a medicine to help you relax (sedative) during your procedure. Activity  Avoid activities that take a lot of effort for at least  3 days after your procedure.  Do not lift anything that is heavier than 10 lb (4.5 kg), or the limit that you are told, until your health care provider says that it is safe.  Return to your normal activities as told by your health care provider. Ask your health care provider what activities are safe for you. General instructions  Take over-the-counter and prescription medicines only as told by your health care provider.  Do not use any products that contain nicotine or tobacco, such as cigarettes and e-cigarettes. If you need help quitting, ask your health care provider.  Do not take baths, swim, or use a hot tub until your health care provider approves.  Do not drink alcohol for 24 hours after your procedure.  Keep all follow-up visits as told by your health care provider. This is important. Contact a health care provider if:  You have redness, mild swelling, or pain around your puncture site.  You have fluid or blood coming from your puncture site that stops after applying firm pressure to the area.  Your puncture site feels warm to the touch.  You have pus or a bad smell coming from your puncture site.  You have a fever.  You have chest pain or discomfort that spreads to your neck, jaw, or arm.  You are sweating a lot.  You feel nauseous.  You have a fast or irregular heartbeat.  You have shortness of breath.  You are dizzy or light-headed and feel the need to lie down.  You have pain or numbness in the arm or leg closest to your puncture site. Get help right away if:  Your puncture site suddenly swells.  Your puncture site is bleeding and the bleeding does not stop after applying firm pressure to the area. These symptoms may represent a serious problem that is an emergency. Do not wait to see if the symptoms will go away. Get medical help right away. Call your local emergency services (911 in the [place] Do not drive yourself to the hospital. Summary  After the  procedure, it is normal to have bruising and tenderness at the puncture site in your groin, neck, or forearm.  Check your puncture site every day for signs of infection.  Get help right away if your puncture site is bleeding and the bleeding does not stop after applying firm pressure to the area. This is a medical emergency. This information is not intended to replace advice given to you by your health care provider. Make sure you discuss any questions you have with your health care provider. Document Revised: 10/19/2017 Document Reviewed: 02/15/2017 Elsevier Patient Education  2020 [employer].

## 2019-12-30 NOTE — Progress Notes (Signed)
Instructions given to patient:  Take it easy for 2 days,  no heavy lifting, pulling or straining for 1 week. Ok to shower the day after procedure Ok to drive on 4th day post procedure Resume normal activities after 1 week If you notice any bleeding, swelling or fevers call office.  Ok to have some bruising or tenderness post procedure. Resume anticoagulant last night   Call clinic if you have any questions. 

## 2019-12-31 ENCOUNTER — Other Ambulatory Visit: Payer: Self-pay | Admitting: Cardiovascular Disease

## 2020-01-05 ENCOUNTER — Telehealth: Payer: Self-pay

## 2020-01-05 NOTE — Telephone Encounter (Signed)
Pt would like to know if he needs to remain on Eliquis 5mg .  Asking if he may have samples if Dr not anticipating to be on medication long term.  If not will need refill this week.

## 2020-01-06 NOTE — Telephone Encounter (Signed)
Dr. Ladona Ridgel, I'm assuming he will need to remain on Eliquis at least until he is seen back in clinic with you on 01/27/20.  He is s/p ablation on 12/29/19.

## 2020-01-06 NOTE — Telephone Encounter (Signed)
Heather, he was in sinus rhythm at the time of his procedure.  He does not need anymore Eliquis.  Thanks

## 2020-01-07 NOTE — Addendum Note (Signed)
Addended by: Sherri Rad C on: 01/07/2020 08:34 AM   Modules accepted: Orders

## 2020-01-07 NOTE — Telephone Encounter (Signed)
Attempted to call the patient. No answer- I left a detailed message of Dr. Odessa Fleming recommendations that he may go ahead and stop eliquis on his identified voice mail.  I asked that he call back with any further questions/ concerns.

## 2020-01-27 ENCOUNTER — Ambulatory Visit (INDEPENDENT_AMBULATORY_CARE_PROVIDER_SITE_OTHER): Payer: Medicare Other | Admitting: Internal Medicine

## 2020-01-27 ENCOUNTER — Encounter: Payer: Self-pay | Admitting: Internal Medicine

## 2020-01-27 ENCOUNTER — Other Ambulatory Visit: Payer: Self-pay

## 2020-01-27 VITALS — BP 110/76 | HR 76 | Ht 70.5 in | Wt 224.0 lb

## 2020-01-27 DIAGNOSIS — I471 Supraventricular tachycardia: Secondary | ICD-10-CM | POA: Diagnosis not present

## 2020-01-27 MED ORDER — APIXABAN 5 MG PO TABS: 5.0000 mg | ORAL_TABLET | Freq: Two times a day (BID) | ORAL | 3 refills | Status: DC

## 2020-01-27 MED ORDER — METOPROLOL SUCCINATE ER 50 MG PO TB24: 50.0000 mg | ORAL_TABLET | Freq: Every day | ORAL | 3 refills | Status: DC

## 2020-01-27 NOTE — Patient Instructions (Addendum)
Medication Instructions:  Your physician has recommended you make the following change in your medication:  1.  Start taking Eliquis 5 mg-  Take one tablet by mouth twice a day  2.  Reduce your Toprol XL 50 mg-  Take one tablet by mouth daily  Labwork: None ordered.  Testing/Procedures: None ordered.  Follow-Up: Your physician wants you to follow-up in: 6 months with Dr. [name] in [place].   You will receive a reminder letter in the mail two months in advance. If you don't receive a letter, please call our office to schedule the follow-up appointment.  Any Other Special Instructions Will Be Listed Below (If Applicable).  If you need a refill on your cardiac medications before your next appointment, please call your pharmacy.

## 2020-01-27 NOTE — Progress Notes (Signed)
HPI Mr. 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 The patient had easily inducible atrial flutter and AVNRT and underwent successful ablation of both and had no additional arrhythmias. He has done well in the interim. He denies chest pain, sob, edema or palpitations. He is playing golf without limit.  No Known Allergies   Current Outpatient Medications  Medication Sig Dispense Refill  . acetaminophen (TYLENOL) 500 MG tablet Take 1,000 mg by mouth daily as needed for moderate pain or headache.    [name] amoxicillin (AMOXIL) 500 MG tablet Take 2,000 mg by mouth See admin instructions. Take 4 capsules (2000 mg) by mouth 1 hour prior to dental procedures.    . Biotin 5000 MCG TABS Take 5,000 mcg by mouth daily.    . Carboxymethylcellul-Glycerin (LUBRICATING EYE DROPS OP) Apply 1 drop to eye 2 (two) times daily as needed (dry eyes).    . cholecalciferol (VITAMIN D) 25 MCG (1000 UNIT) tablet Take 1,000 Units by mouth daily.    . diclofenac (VOLTAREN) 75 MG EC tablet Take 75 mg by mouth 2 (two) times daily.    [name] FIBER THERAPY 500 MG TABS Take 500 mg by mouth 2 (two) times daily.    . finasteride (PROSCAR) 5 MG tablet Take 1 tablet (5 mg total) by mouth daily. 90 tablet 2  . gabapentin (NEURONTIN) 300 MG capsule Take 300 mg by mouth 3 (three) times daily.     [name] HYDROcodone-acetaminophen (NORCO) 7.5-325 MG tablet Take 1 tablet by mouth every 6 (six) hours as needed for moderate pain. 50 tablet 0  . levothyroxine (SYNTHROID, LEVOTHROID) 25 MCG tablet Take 25 mcg by mouth daily before breakfast.    . magnesium oxide (MAG-OX) 400 MG tablet Take 400 mg by mouth 2 (two) times daily.     . metoprolol succinate (TOPROL-XL) 50 MG 24 hr tablet Take 1 tablet (50 mg total) by mouth daily. 90 tablet 3  . Omega-3 Fatty Acids (FISH OIL) 1000 MG CAPS Take 1,000 mg by mouth 2 (two) times daily.    . pantoprazole (PROTONIX) 40 MG tablet Take 40 mg by mouth daily.    .  rosuvastatin (CRESTOR) 5 MG tablet Take 5 mg by mouth daily.     . sodium chloride (OCEAN) 0.65 % SOLN nasal spray Place 1 spray into both nostrils 2 (two) times daily.    . tamsulosin (FLOMAX) 0.4 MG CAPS capsule Take 1 capsule (0.4 mg total) by mouth daily. 90 capsule 2  . vitamin B-12 (CYANOCOBALAMIN) 1000 MCG tablet Take 1,000 mcg by mouth daily.     . Zinc 50 MG TABS Take 50 mg by mouth daily.    [name] apixaban (ELIQUIS) 5 MG TABS tablet Take 1 tablet (5 mg total) by mouth 2 (two) times daily. 180 tablet 3   No current facility-administered medications for this visit.     Past Medical History:  Diagnosis Date  . Arthritis   . Dizziness and giddiness   . Dysrhythmia   . GERD (gastroesophageal reflux disease)   . Gout   . History of NICM    a. Prev EF as low as 35-40%;  b. 03/2012 Echo: EF 50-55%, Gr 1 DD, mild MR;  c. 06/2014 Echo: EF 50-55%, gr 1 DD, mild MR, mildly dil LA.  [name] History of systolic CHF    a. Prev EF as low as 35-40%;  b. 03/2012 Echo: EF 50-55%, Gr 1 DD, mild MR;  c. 06/2014 Echo: EF 50-55%, gr 1 DD, mild MR, mildly dil LA.  [name] Hypothyroidism   . Non-obstructive CAD    a. 2009 nonobs dzs, LAD 20, EF 40-45%;  b. 07/2012 Ex MV: EF 55%, freq pvc's, no ischemia/infarct.  [name] PAF (paroxysmal atrial fibrillation) (HCC)    a. 11/2013 - occurred 3 wks post-op Appe in setting of pelvic abscess-->amio-->converted but amio later d/c'd 2/2 hypothyroidism.  . Paroxysmal supraventricular tachycardia (HCC)    a. SVT/PAT  . Prostate enlargement   . PSVT (paroxysmal supraventricular tachycardia) (HCC)    a. 11/2013.  [name] PVC's (premature ventricular contractions)    a. Asymptomatic - managed with Toprol and Mg.  . Sciatica of right side     ROS:   All systems reviewed and negative except as noted in the HPI.   Past Surgical History:  Procedure Laterality Date  . ABSCESS DRAINAGE    . APPENDECTOMY  2015  . BACK SURGERY    . CARDIAC CATHETERIZATION  2009  . CATARACT EXTRACTION Right    . CATARACT EXTRACTION W/PHACO Right 10/17/2017   Procedure: CATARACT EXTRACTION PHACO AND INTRAOCULAR LENS PLACEMENT (IOC) RIGHT;  Surgeon: [name], MD;  Location: [facility];  Service: Ophthalmology;  Laterality: Right;  . CATARACT EXTRACTION W/PHACO Left 12/24/2017   Procedure: CATARACT EXTRACTION PHACO AND INTRAOCULAR LENS PLACEMENT ([facility]) LEFT;  Surgeon: [name], MD;  Location: [facility];  Service: Ophthalmology;  Laterality: Left;  . COLONOSCOPY    . COLONOSCOPY WITH PROPOFOL N/A 11/06/2018   Procedure: COLONOSCOPY WITH PROPOFOL;  Surgeon: Toledo, Boykin Nearing, MD;  Location: [facility];  Service: Gastroenterology;  Laterality: N/A;  . DUPUYTREN / PALMAR FASCIOTOMY Bilateral   . 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 HAND SURGERY  1999  . JOINT REPLACEMENT Right 2012   TKR  . PARTIAL KNEE ARTHROPLASTY Left   . SVT ABLATION N/A 12/29/2019   Procedure: SVT ABLATION;  Surgeon: Marinus Maw, MD;  Location: [facility] INVASIVE CV LAB;  Service: Cardiovascular;  Laterality: N/A;  . TOTAL KNEE ARTHROPLASTY Right 06/17/2017     Family History  Problem Relation Age of Onset  . Heart attack Father 1  . Coronary artery disease Other        family hx  . Heart failure Other        family hx - CHF     Social History   Socioeconomic History  . Marital status: Married    Spouse name: Not on file  . Number of children: Not on file  . Years of education: Not on file  . Highest education level: Not on file  Occupational History  . Not on file  Tobacco Use  . Smoking status: Never Smoker  . Smokeless tobacco: Never Used  . Tobacco comment: tobacco use - no  Substance and Sexual Activity  . Alcohol use: No  . Drug use: No  . Sexual activity: Yes    Birth control/protection:  None  Other Topics Concern  . Not on file  Social History Narrative   Retired, married, gets regular exercise.   Social Determinants of Health   Financial Resource Strain:   . Difficulty of Paying Living Expenses: Not on file  Food Insecurity:   . Worried About Programme researcher, broadcasting/film/video in the Last Year: Not on file  .  Ran Out of Food in the Last Year: Not on file  Transportation Needs:   . Lack of Transportation (Medical): Not on file  . Lack of Transportation (Non-Medical): Not on file  Physical Activity:   . Days of Exercise per Week: Not on file  . Minutes of Exercise per Session: Not on file  Stress:   . Feeling of Stress : Not on file  Social Connections:   . Frequency of Communication with Friends and Family: Not on file  . Frequency of Social Gatherings with Friends and Family: Not on file  . Attends Religious Services: Not on file  . Active Member of Clubs or Organizations: Not on file  . Attends Banker Meetings: Not on file  . Marital Status: Not on file  Intimate Partner Violence:   . Fear of Current or Ex-Partner: Not on file  . Emotionally Abused: Not on file  . Physically Abused: Not on file  . Sexually Abused: Not on file     BP 110/76   Pulse 76   Ht 5' 10.5" (1.791 m)   Wt 224 lb (101.6 kg)   SpO2 96%   BMI 31.69 kg/m   Physical Exam:  Well appearing NAD HEENT: Unremarkable Neck:  No JVD, no thyromegally Lymphatics:  No adenopathy Back:  No CVA tenderness Lungs:  Clear with no wheezes HEART:  IRegular rate rhythm, no murmurs, no rubs, no clicks Abd:  soft, positive bowel sounds, no organomegally, no rebound, no guarding Ext:  2 plus pulses, no edema, no cyanosis, no clubbing Skin:  No rashes no nodules Neuro:  CN II through XII intact, motor grossly intact  EKG - atrial fib with a controlled VR  Assess/Plan: 1. 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 No recurrence. 2. Atrial flutter - he is s/p ablation of atrial flutter. No  recurrence 3. Atrial fib- this is a new problem. He was unaware. He was no inducible SVT or atrial fib or flutter despite an aggressive protocol post ablation. He will be started on Eliquis. His CHADSVASC is at least 3. He is asymptomatic and his rate appears to be well controlled. He will continue toprol xl 50 daily. 4. HTN - his SBP is well controlled on medical therapy.   Leonia Reeves.D.

## 2020-03-04 ENCOUNTER — Encounter: Payer: Self-pay | Admitting: Cardiovascular Disease

## 2020-03-04 ENCOUNTER — Other Ambulatory Visit: Payer: Self-pay

## 2020-03-04 ENCOUNTER — Ambulatory Visit (INDEPENDENT_AMBULATORY_CARE_PROVIDER_SITE_OTHER): Payer: Medicare Other | Admitting: Cardiovascular Disease

## 2020-03-04 VITALS — BP 108/66 | HR 83 | Ht 70.5 in | Wt 221.4 lb

## 2020-03-04 DIAGNOSIS — I4819 Other persistent atrial fibrillation: Secondary | ICD-10-CM

## 2020-03-04 DIAGNOSIS — I493 Ventricular premature depolarization: Secondary | ICD-10-CM

## 2020-03-04 DIAGNOSIS — I480 Paroxysmal atrial fibrillation: Secondary | ICD-10-CM

## 2020-03-04 DIAGNOSIS — I471 Supraventricular tachycardia, unspecified: Secondary | ICD-10-CM

## 2020-03-04 NOTE — Progress Notes (Signed)
Cardiology Office Note   Date:  03/04/2020   ID:  Vincent, [name] 09/19/1941, MRN [id number]  PCP:  Tracie Harrier, MD  Cardiologist:   Kathlyn Sacramento, MD   Chief Complaint  Patient presents with  . other    F/U SVT ablation      History of Present Illness: Vincent [name] is a 79 y.o. male who presents for a follow up  regarding supraventricular tachycardia, mild cardiomyopathy, postoperative atrial fibrillation, atrial flutter and ventricular bigeminy.  Catheterization from 2009 showed an ejection fraction of 40-45% range with a mild nonobstructive 20% plaque in his LAD.  Most recent nuclear stress test in 2013 was normal.  The patient had recent worsening of palpitations and tachycardia.  Outpatient monitor showed frequent SVT with periods of atrial flutter which correlated with his symptoms.  Echocardiogram showed an EF of 50 to 55% with mildly dilated left atrium and no significant valvular abnormalities. The patient was referred to EP and underwent successful EP study and catheter ablation of both AV node reentry tachycardia as well as typical atrial flutter by Dr. Lovena Le.  He was also noted to have paroxysmal atrial fibrillation and was started on Eliquis.  When he saw Dr. Lovena Le last month he was still in atrial fibrillation and Eliquis was resumed at that time.  The patient has been doing well with resolution of tachycardia episodes.  However, he continues to be in atrial fibrillation today with controlled ventricular rate.  He does not have significant symptoms from this.    Past Medical History:  Diagnosis Date  . Arthritis   . Dizziness and giddiness   . Dysrhythmia   . GERD (gastroesophageal reflux disease)   . Gout   . History of NICM    a. Prev EF as low as 35-40%;  b. 03/2012 Echo: EF 50-55%, Gr 1 DD, mild MR;  c. 06/2014 Echo: EF 50-55%, gr 1 DD, mild MR, mildly dil LA.  [name] History of systolic CHF    a. Prev EF as low as 35-40%;  b. 03/2012 Echo:  EF 50-55%, Gr 1 DD, mild MR;  c. 06/2014 Echo: EF 50-55%, gr 1 DD, mild MR, mildly dil LA.  [name] Hypothyroidism   . Non-obstructive CAD    a. 2009 nonobs dzs, LAD 20, EF 40-45%;  b. 07/2012 Ex MV: EF 55%, freq pvc's, no ischemia/infarct.  [name] PAF (paroxysmal atrial fibrillation) ([place])    a. 11/2013 - occurred 3 wks post-op Appe in setting of pelvic abscess-->amio-->converted but amio later d/c'd 2/2 hypothyroidism.  . Paroxysmal supraventricular tachycardia (HCC)    a. SVT/PAT  . Prostate enlargement   . PSVT (paroxysmal supraventricular tachycardia) ([place])    a. 11/2013.  [name] PVC's (premature ventricular contractions)    a. Asymptomatic - managed with Toprol and Mg.  . Sciatica of right side     Past Surgical History:  Procedure Laterality Date  . ABSCESS DRAINAGE    . APPENDECTOMY  2015  . BACK SURGERY    . CARDIAC CATHETERIZATION  2009  . CATARACT EXTRACTION Right   . CATARACT EXTRACTION W/PHACO Right 10/17/2017   Procedure: CATARACT EXTRACTION PHACO AND INTRAOCULAR LENS PLACEMENT ([place]) RIGHT;  Surgeon: Leandrew Koyanagi, MD;  Location: [place];  Service: Ophthalmology;  Laterality: Right;  . CATARACT EXTRACTION W/PHACO Left 12/24/2017   Procedure: CATARACT EXTRACTION PHACO AND INTRAOCULAR LENS PLACEMENT ([place]) LEFT;  Surgeon: Leandrew Koyanagi, MD;  Location: [place];  Service: Ophthalmology;  Laterality: Left;  .  COLONOSCOPY    . COLONOSCOPY WITH PROPOFOL N/A 11/06/2018   Procedure: COLONOSCOPY WITH PROPOFOL;  Surgeon: Toledo, Boykin Nearing, MD;  Location: [facility];  Service: Gastroenterology;  Laterality: N/A;  . DUPUYTREN / PALMAR FASCIOTOMY Bilateral   . 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 HAND SURGERY  1999  . JOINT REPLACEMENT Right 2012   TKR  . PARTIAL KNEE ARTHROPLASTY Left    . SVT ABLATION N/A 12/29/2019   Procedure: SVT ABLATION;  Surgeon: Marinus Maw, MD;  Location: [facility] INVASIVE CV LAB;  Service: Cardiovascular;  Laterality: N/A;  . TOTAL KNEE ARTHROPLASTY Right 06/17/2017     Current Outpatient Medications  Medication Sig Dispense Refill  . acetaminophen (TYLENOL) 500 MG tablet Take 1,000 mg by mouth daily as needed for moderate pain or headache.    [name] amoxicillin (AMOXIL) 500 MG tablet Take 2,000 mg by mouth See admin instructions. Take 4 capsules (2000 mg) by mouth 1 hour prior to dental procedures.    [name] apixaban (ELIQUIS) 5 MG TABS tablet Take 1 tablet (5 mg total) by mouth 2 (two) times daily. 180 tablet 3  . Biotin 5000 MCG TABS Take 5,000 mcg by mouth daily.    . Carboxymethylcellul-Glycerin (LUBRICATING EYE DROPS OP) Apply 1 drop to eye 2 (two) times daily as needed (dry eyes).    . cholecalciferol (VITAMIN D) 25 MCG (1000 UNIT) tablet Take 1,000 Units by mouth daily.    . diclofenac (VOLTAREN) 75 MG EC tablet Take 75 mg by mouth 2 (two) times daily.    [name] FIBER THERAPY 500 MG TABS Take 500 mg by mouth 2 (two) times daily.    . finasteride (PROSCAR) 5 MG tablet Take 1 tablet (5 mg total) by mouth daily. 90 tablet 2  . gabapentin (NEURONTIN) 300 MG capsule Take 300 mg by mouth 3 (three) times daily.     [name] HYDROcodone-acetaminophen (NORCO) 7.5-325 MG tablet Take 1 tablet by mouth every 6 (six) hours as needed for moderate pain. 50 tablet 0  . levothyroxine (SYNTHROID, LEVOTHROID) 25 MCG tablet Take 25 mcg by mouth daily before breakfast.    . magnesium oxide (MAG-OX) 400 MG tablet Take 400 mg by mouth 2 (two) times daily.     . metoprolol succinate (TOPROL-XL) 50 MG 24 hr tablet Take 1 tablet (50 mg total) by mouth daily. 90 tablet 3  . Omega-3 Fatty Acids (FISH OIL) 1000 MG CAPS Take 1,000 mg by mouth 2 (two) times daily.    . pantoprazole (PROTONIX) 40 MG tablet Take 40 mg by mouth daily.    . rosuvastatin (CRESTOR) 5 MG tablet Take 5 mg by mouth  daily.     . sodium chloride (OCEAN) 0.65 % SOLN nasal spray Place 1 spray into both nostrils 2 (two) times daily.    . tamsulosin (FLOMAX) 0.4 MG CAPS capsule Take 1 capsule (0.4 mg total) by mouth daily. 90 capsule 2  . vitamin B-12 (CYANOCOBALAMIN) 1000 MCG tablet Take 1,000 mcg by mouth daily.     . Zinc 50 MG TABS Take 50 mg by mouth daily.     No current facility-administered medications for this visit.    Allergies:   Patient has no known allergies.    Social History:  The patient  reports that he has never smoked. He has never used smokeless tobacco.  He reports that he does not drink alcohol or use drugs.   Family History:  The patient's family history includes Coronary artery disease in an other family member; Heart attack (age of onset: 30) in his father; Heart failure in an other family member.    ROS:  Please see the history of present illness.   Otherwise, review of systems are positive for none.   All other systems are reviewed and negative.    PHYSICAL EXAM: VS:  BP 108/66   Pulse 83   Ht 5' 10.5" (1.791 m)   Wt 221 lb 6.4 oz (100.4 kg)   SpO2 97%   BMI 31.32 kg/m  , BMI Body mass index is 31.32 kg/m. GEN: Well nourished, well developed, in no acute distress  HEENT: normal  Neck: no JVD, carotid bruits, or masses Cardiac: Irregularly irregular; no murmurs, rubs, or gallops,no edema  Respiratory:  clear to auscultation bilaterally, normal work of breathing GI: soft, nontender, nondistended, + BS MS: no deformity or atrophy  Skin: warm and dry, no rash Neuro:  Strength and sensation are intact Psych: euthymic mood, full affect   EKG:  EKG is ordered today. The ekg ordered today demonstrates atrial fibrillation with a PVC.  Nonspecific ST and T wave changes.   Recent Labs: 12/25/2019: Hemoglobin 14.0; Platelets 199 12/26/2019: BUN 28; Creatinine, Ser 1.25; Potassium 4.8; Sodium 134    Lipid Panel    Component Value Date/Time   CHOL 218 10/25/2009 0000    TRIG 189 10/25/2009 0000   HDL 37.3 10/25/2009 0000   LDLCALC 142.9 10/25/2009 0000      Wt Readings from Last 3 Encounters:  03/04/20 221 lb 6.4 oz (100.4 kg)  01/27/20 224 lb (101.6 kg)  12/29/19 225 lb (102.1 kg)      No flowsheet data found.    ASSESSMENT AND PLAN:  1.  Paroxysmal supraventricular tachycardia and atrial flutter: Status post successful ablation by Dr. Ladona Ridgel with resolution of tachycardia episodes.  Continue Toprol 50 mg daily.  2. 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  3. History of nonischemic cardiomyopathy: Most recent echo showed an EF of 50 to 55% which is improved from before.  4.  Persistent atrial fibrillation: It appears that the patient has been in atrial fibrillation at least since last month.  He has been anticoagulated since then without interruption.  I think it is worth trying to cardiovert him and see if we get him back in sinus rhythm.  If that does not work, we might elect rate control given that he is not very symptomatic.   Disposition: Follow-up with me in 1 month.  Signed,  [name], MD  03/04/2020 9:12 AM    [facility]

## 2020-03-04 NOTE — Patient Instructions (Addendum)
Medication Instructions:  Your physician recommends that you continue on your current medications as directed. Please refer to the Current Medication list given to you today.  *If you need a refill on your cardiac medications before your next appointment, please call your pharmacy*   Lab Work: Bmet today  You will need a COVID test on 03/12/20 between 8:30am-1pm. Please report to the [facility] medical arts drive up test site.    If you have labs (blood work) drawn today and your tests are completely normal, you will receive your results only by: [name] MyChart Message (if you have MyChart) OR . A paper copy in the mail If you have any lab test that is abnormal or we need to change your treatment, we will call you to review the results.   Testing/Procedures: Your physician has recommended that you have a Cardioversion (DCCV). Electrical Cardioversion uses a jolt of electricity to your heart either through paddles or wired patches attached to your chest. This is a controlled, usually prescheduled, procedure. Defibrillation is done under light anesthesia in the hospital, and you usually go home the day of the procedure. This is done to get your heart back into a normal rhythm. You are not awake for the procedure. Please see the instruction sheet given to you today.     Follow-Up: At [facility], you and your health needs are our priority.  As part of our continuing mission to provide you with exceptional heart care, we have created designated Provider Care Teams.  These Care Teams include your primary Cardiologist (physician) and Advanced Practice Providers (APPs -  Physician Assistants and Nurse Practitioners) who all work together to provide you with the care you need, when you need it.  We recommend signing up for the patient portal called "MyChart".  Sign up information is provided on this After Visit Summary.  MyChart is used to connect with patients for Virtual Visits (Telemedicine).  Patients  are able to view lab/test results, encounter notes, upcoming appointments, etc.  Non-urgent messages can be sent to your provider as well.   To learn more about what you can do with MyChart, go to [email or web address].    Your next appointment:   5 week(s)  The format for your next appointment:   In Person  Provider:    You may see [name], MD or one of the following Advanced Practice Providers on your designated Care Team:    Nicolasa Ducking, NP  Eula Listen, PA-C  Marisue Ivan, PA-C    Other Instructions You are scheduled for a Cardioversion on 03/13/20 @ 7a;30am with  Dr. Kirke Corin Please arrive at the [facility] at 7a.m. on the day of your procedure.  DIET INSTRUCTIONS:  Nothing to eat or drink after midnight except your medications with a              sip of water.         1) Labs: Bmet Today. COVID test on 03/12/20  2) Medications:  YOU MAY TAKE ALL of your remaining medications with a small amount of water.  3) Must have a responsible person to drive you home.  4) Bring a current list of your medications and current insurance cards.    If you have any questions after you get home, please call the office at [phone]

## 2020-03-04 NOTE — H&P (View-Only) (Signed)
Cardiology Office Note   Date:  03/04/2020   ID:  [name], [name] 09/19/1941, MRN [id number]  PCP:  Tracie Harrier, MD  Cardiologist:   Kathlyn Sacramento, MD   Chief Complaint  Patient presents with  . other    F/U SVT ablation      History of Present Illness: Vincent [name] is a 79 y.o. male who presents for a follow up  regarding supraventricular tachycardia, mild cardiomyopathy, postoperative atrial fibrillation, atrial flutter and ventricular bigeminy.  Catheterization from 2009 showed an ejection fraction of 40-45% range with a mild nonobstructive 20% plaque in his LAD.  Most recent nuclear stress test in 2013 was normal.  The patient had recent worsening of palpitations and tachycardia.  Outpatient monitor showed frequent SVT with periods of atrial flutter which correlated with his symptoms.  Echocardiogram showed an EF of 50 to 55% with mildly dilated left atrium and no significant valvular abnormalities. The patient was referred to EP and underwent successful EP study and catheter ablation of both AV node reentry tachycardia as well as typical atrial flutter by Dr. Lovena Le.  He was also noted to have paroxysmal atrial fibrillation and was started on Eliquis.  When he saw Dr. Lovena Le last month he was still in atrial fibrillation and Eliquis was resumed at that time.  The patient has been doing well with resolution of tachycardia episodes.  However, he continues to be in atrial fibrillation today with controlled ventricular rate.  He does not have significant symptoms from this.    Past Medical History:  Diagnosis Date  . Arthritis   . Dizziness and giddiness   . Dysrhythmia   . GERD (gastroesophageal reflux disease)   . Gout   . History of NICM    a. Prev EF as low as 35-40%;  b. 03/2012 Echo: EF 50-55%, Gr 1 DD, mild MR;  c. 06/2014 Echo: EF 50-55%, gr 1 DD, mild MR, mildly dil LA.  [name] History of systolic CHF    a. Prev EF as low as 35-40%;  b. 03/2012 Echo:  EF 50-55%, Gr 1 DD, mild MR;  c. 06/2014 Echo: EF 50-55%, gr 1 DD, mild MR, mildly dil LA.  [name] Hypothyroidism   . Non-obstructive CAD    a. 2009 nonobs dzs, LAD 20, EF 40-45%;  b. 07/2012 Ex MV: EF 55%, freq pvc's, no ischemia/infarct.  [name] PAF (paroxysmal atrial fibrillation) ([place])    a. 11/2013 - occurred 3 wks post-op Appe in setting of pelvic abscess-->amio-->converted but amio later d/c'd 2/2 hypothyroidism.  . Paroxysmal supraventricular tachycardia ([facility])    a. SVT/PAT  . Prostate enlargement   . PSVT (paroxysmal supraventricular tachycardia) ([place])    a. 11/2013.  [name] PVC's (premature ventricular contractions)    a. Asymptomatic - managed with Toprol and Mg.  . Sciatica of right side     Past Surgical History:  Procedure Laterality Date  . ABSCESS DRAINAGE    . APPENDECTOMY  2015  . BACK SURGERY    . CARDIAC CATHETERIZATION  2009  . CATARACT EXTRACTION Right   . CATARACT EXTRACTION W/PHACO Right 10/17/2017   Procedure: CATARACT EXTRACTION PHACO AND INTRAOCULAR LENS PLACEMENT ([place]) RIGHT;  Surgeon: Leandrew Koyanagi, MD;  Location: [facility];  Service: Ophthalmology;  Laterality: Right;  . CATARACT EXTRACTION W/PHACO Left 12/24/2017   Procedure: CATARACT EXTRACTION PHACO AND INTRAOCULAR LENS PLACEMENT ([place]) LEFT;  Surgeon: Leandrew Koyanagi, MD;  Location: [facility];  Service: Ophthalmology;  Laterality: Left;  .  COLONOSCOPY    . COLONOSCOPY WITH PROPOFOL N/A 11/06/2018   Procedure: COLONOSCOPY WITH PROPOFOL;  Surgeon: Toledo, Teodoro K, MD;  Location: [facility];  Service: Gastroenterology;  Laterality: N/A;  . DUPUYTREN / PALMAR FASCIOTOMY Bilateral   . 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 HAND SURGERY  1999  . JOINT REPLACEMENT Right 2012   TKR  . PARTIAL KNEE ARTHROPLASTY Left    . SVT ABLATION N/A 12/29/2019   Procedure: SVT ABLATION;  Surgeon: Taylor, Gregg W, MD;  [facility];  Service: Cardiovascular;  Laterality: N/A;  . TOTAL KNEE ARTHROPLASTY Right 06/17/2017     Current Outpatient Medications  Medication Sig Dispense Refill  . acetaminophen (TYLENOL) 500 MG tablet Take 1,000 mg by mouth daily as needed for moderate pain or headache.    . amoxicillin (AMOXIL) 500 MG tablet Take 2,000 mg by mouth See admin instructions. Take 4 capsules (2000 mg) by mouth 1 hour prior to dental procedures.    . apixaban (ELIQUIS) 5 MG TABS tablet Take 1 tablet (5 mg total) by mouth 2 (two) times daily. 180 tablet 3  . Biotin 5000 MCG TABS Take 5,000 mcg by mouth daily.    . Carboxymethylcellul-Glycerin (LUBRICATING EYE DROPS OP) Apply 1 drop to eye 2 (two) times daily as needed (dry eyes).    . cholecalciferol (VITAMIN D) 25 MCG (1000 UNIT) tablet Take 1,000 Units by mouth daily.    . diclofenac (VOLTAREN) 75 MG EC tablet Take 75 mg by mouth 2 (two) times daily.    . FIBER THERAPY 500 MG TABS Take 500 mg by mouth 2 (two) times daily.    . finasteride (PROSCAR) 5 MG tablet Take 1 tablet (5 mg total) by mouth daily. 90 tablet 2  . gabapentin (NEURONTIN) 300 MG capsule Take 300 mg by mouth 3 (three) times daily.     . HYDROcodone-acetaminophen (NORCO) 7.5-325 MG tablet Take 1 tablet by mouth every 6 (six) hours as needed for moderate pain. 50 tablet 0  . levothyroxine (SYNTHROID, LEVOTHROID) 25 MCG tablet Take 25 mcg by mouth daily before breakfast.    . magnesium oxide (MAG-OX) 400 MG tablet Take 400 mg by mouth 2 (two) times daily.     . metoprolol succinate (TOPROL-XL) 50 MG 24 hr tablet Take 1 tablet (50 mg total) by mouth daily. 90 tablet 3  . Omega-3 Fatty Acids (FISH OIL) 1000 MG CAPS Take 1,000 mg by mouth 2 (two) times daily.    . pantoprazole (PROTONIX) 40 MG tablet Take 40 mg by mouth daily.    . rosuvastatin (CRESTOR) 5 MG tablet Take 5 mg by mouth  daily.     . sodium chloride (OCEAN) 0.65 % SOLN nasal spray Place 1 spray into both nostrils 2 (two) times daily.    . tamsulosin (FLOMAX) 0.4 MG CAPS capsule Take 1 capsule (0.4 mg total) by mouth daily. 90 capsule 2  . vitamin B-12 (CYANOCOBALAMIN) 1000 MCG tablet Take 1,000 mcg by mouth daily.     . Zinc 50 MG TABS Take 50 mg by mouth daily.     No current facility-administered medications for this visit.    Allergies:   Patient has no known allergies.    Social History:  The patient  reports that he has never smoked. He has never used smokeless tobacco.   He reports that he does not drink alcohol or use drugs.   Family History:  The patient's family history includes Coronary artery disease in an other family member; Heart attack (age of onset: 76) in his father; Heart failure in an other family member.    ROS:  Please see the history of present illness.   Otherwise, review of systems are positive for none.   All other systems are reviewed and negative.    PHYSICAL EXAM: VS:  BP 108/66   Pulse 83   Ht 5' 10.5" (1.791 m)   Wt 221 lb 6.4 oz (100.4 kg)   SpO2 97%   BMI 31.32 kg/m  , BMI Body mass index is 31.32 kg/m. GEN: Well nourished, well developed, in no acute distress  HEENT: normal  Neck: no JVD, carotid bruits, or masses Cardiac: Irregularly irregular; no murmurs, rubs, or gallops,no edema  Respiratory:  clear to auscultation bilaterally, normal work of breathing GI: soft, nontender, nondistended, + BS MS: no deformity or atrophy  Skin: warm and dry, no rash Neuro:  Strength and sensation are intact Psych: euthymic mood, full affect   EKG:  EKG is ordered today. The ekg ordered today demonstrates atrial fibrillation with a PVC.  Nonspecific ST and T wave changes.   Recent Labs: 12/25/2019: Hemoglobin 14.0; Platelets 199 12/26/2019: BUN 28; Creatinine, Ser 1.25; Potassium 4.8; Sodium 134    Lipid Panel    Component Value Date/Time   CHOL 218 10/25/2009 0000    TRIG 189 10/25/2009 0000   HDL 37.3 10/25/2009 0000   LDLCALC 142.9 10/25/2009 0000      Wt Readings from Last 3 Encounters:  03/04/20 221 lb 6.4 oz (100.4 kg)  01/27/20 224 lb (101.6 kg)  12/29/19 225 lb (102.1 kg)      No flowsheet data found.    ASSESSMENT AND PLAN:  1.  Paroxysmal supraventricular tachycardia and atrial flutter: Status post successful ablation by Dr. Taylor with resolution of tachycardia episodes.  Continue Toprol 50 mg daily.  2. 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  3. History of nonischemic cardiomyopathy: Most recent echo showed an EF of 50 to 55% which is improved from before.  4.  Persistent atrial fibrillation: It appears that the patient has been in atrial fibrillation at least since last month.  He has been anticoagulated since then without interruption.  I think it is worth trying to cardiovert him and see if we get him back in sinus rhythm.  If that does not work, we might elect rate control given that he is not very symptomatic.   Disposition: Follow-up with me in 1 month.  Signed,  Victoriano Campion, MD  03/04/2020 9:12 AM    [facility] 

## 2020-03-05 ENCOUNTER — Telehealth: Payer: Self-pay

## 2020-03-05 DIAGNOSIS — I480 Paroxysmal atrial fibrillation: Secondary | ICD-10-CM

## 2020-03-05 LAB — BASIC METABOLIC PANEL WITH GFR
BUN/Creatinine Ratio: 17 (ref 10–24)
BUN: 20 mg/dL (ref 8–27)
CO2: 23 mmol/L (ref 20–29)
Calcium: 9.6 mg/dL (ref 8.6–10.2)
Chloride: 104 mmol/L (ref 96–106)
Creatinine, Ser: 1.17 mg/dL (ref 0.76–1.27)
GFR calc Af Amer: 68 mL/min/1.73
GFR calc non Af Amer: 59 mL/min/1.73 — ABNORMAL LOW
Glucose: 100 mg/dL — ABNORMAL HIGH (ref 65–99)
Potassium: 4.5 mmol/L (ref 3.5–5.2)
Sodium: 142 mmol/L (ref 134–144)

## 2020-03-05 NOTE — Telephone Encounter (Signed)
Cbc order was not placed during the patients recent o/v appt Patient will need one prior to his 03/15/20. lmom for the patient to [facility] Cbc [facility]. Order placed and is in Epic.

## 2020-03-05 NOTE — Telephone Encounter (Signed)
Spoke with the patient and made him aware of the message below. [facility] will [facility] next week to have the Cbc drawn.

## 2020-03-05 NOTE — Telephone Encounter (Signed)
-----   Message from Iran Ouch, MD sent at 03/05/2020  1:46 PM EDT ----- Regarding: RE: missing CBC Yes please order CBC but we have time as his procedure is in 10 days.   ----- Message ----- From: Jarvis Newcomer, RN Sent: 03/05/2020   1:40 PM EDT To: Iran Ouch, MD Subject: missing CBC                                    I think I missed that one and did not put on the order for the Cbc. His Feb 2021  Cbc was normal.  Do I need to order one stat?

## 2020-03-09 ENCOUNTER — Other Ambulatory Visit: Payer: Self-pay

## 2020-03-09 ENCOUNTER — Other Ambulatory Visit
Admission: RE | Admit: 2020-03-09 | Discharge: 2020-03-09 | Disposition: A | Discharge status: Home / Self Care | Payer: Medicare Other | Attending: Cardiovascular Disease | Admitting: Cardiovascular Disease

## 2020-03-09 DIAGNOSIS — I480 Paroxysmal atrial fibrillation: Secondary | ICD-10-CM | POA: Insufficient documentation

## 2020-03-09 LAB — CBC WITH DIFFERENTIAL/PLATELET
Abs Immature Granulocytes: 0.01 10*3/uL (ref 0.00–0.07)
Basophils Absolute: 0.1 10*3/uL (ref 0.0–0.1)
Basophils Relative: 1 %
Eosinophils Absolute: 0.2 10*3/uL (ref 0.0–0.5)
Eosinophils Relative: 5 %
HCT: 39.2 % (ref 39.0–52.0)
Hemoglobin: 12.8 g/dL — ABNORMAL LOW (ref 13.0–17.0)
Immature Granulocytes: 0 %
Lymphocytes Relative: 39 %
Lymphs Abs: 1.8 10*3/uL (ref 0.7–4.0)
MCH: 30.5 pg (ref 26.0–34.0)
MCHC: 32.7 g/dL (ref 30.0–36.0)
MCV: 93.6 fL (ref 80.0–100.0)
Monocytes Absolute: 0.3 10*3/uL (ref 0.1–1.0)
Monocytes Relative: 7 %
Neutro Abs: 2.3 10*3/uL (ref 1.7–7.7)
Neutrophils Relative %: 48 %
Platelets: 170 10*3/uL (ref 150–400)
RBC: 4.19 MIL/uL — ABNORMAL LOW (ref 4.22–5.81)
RDW: 12.8 % (ref 11.5–15.5)
WBC: 4.7 10*3/uL (ref 4.0–10.5)
nRBC: 0 % (ref 0.0–0.2)

## 2020-03-12 ENCOUNTER — Other Ambulatory Visit
Admission: RE | Admit: 2020-03-12 | Discharge: 2020-03-12 | Disposition: A | Discharge status: Home / Self Care | Payer: Medicare Other | Source: Ambulatory Visit | Attending: Cardiovascular Disease | Admitting: Cardiovascular Disease

## 2020-03-12 DIAGNOSIS — Z01812 Encounter for preprocedural laboratory examination: Secondary | ICD-10-CM | POA: Diagnosis present

## 2020-03-12 DIAGNOSIS — Z20822 Contact with and (suspected) exposure to covid-19: Secondary | ICD-10-CM | POA: Insufficient documentation

## 2020-03-12 LAB — SARS CORONAVIRUS 2 (TAT 6-24 HRS): SARS Coronavirus 2: NEGATIVE

## 2020-03-15 ENCOUNTER — Ambulatory Visit: Payer: Medicare Other | Admitting: Anesthesiology

## 2020-03-15 ENCOUNTER — Encounter
Admission: RE | Disposition: A | Discharge status: Home / Self Care | Payer: Self-pay | Source: Home / Self Care | Attending: Cardiovascular Disease

## 2020-03-15 ENCOUNTER — Other Ambulatory Visit: Payer: Self-pay

## 2020-03-15 ENCOUNTER — Encounter: Payer: Self-pay | Admitting: Cardiovascular Disease

## 2020-03-15 ENCOUNTER — Ambulatory Visit
Admission: RE | Admit: 2020-03-15 | Discharge: 2020-03-15 | Disposition: A | Discharge status: Home / Self Care | Payer: Medicare Other | Attending: Cardiovascular Disease | Admitting: Cardiovascular Disease

## 2020-03-15 DIAGNOSIS — I493 Ventricular premature depolarization: Secondary | ICD-10-CM | POA: Diagnosis not present

## 2020-03-15 DIAGNOSIS — I471 Supraventricular tachycardia: Secondary | ICD-10-CM | POA: Diagnosis not present

## 2020-03-15 DIAGNOSIS — Z8249 Family history of ischemic heart disease and other diseases of the circulatory system: Secondary | ICD-10-CM | POA: Diagnosis not present

## 2020-03-15 DIAGNOSIS — R008 Other abnormalities of heart beat: Secondary | ICD-10-CM | POA: Diagnosis not present

## 2020-03-15 DIAGNOSIS — Z9841 Cataract extraction status, right eye: Secondary | ICD-10-CM | POA: Diagnosis not present

## 2020-03-15 DIAGNOSIS — G4730 Sleep apnea, unspecified: Secondary | ICD-10-CM | POA: Diagnosis not present

## 2020-03-15 DIAGNOSIS — Z961 Presence of intraocular lens: Secondary | ICD-10-CM | POA: Insufficient documentation

## 2020-03-15 DIAGNOSIS — K219 Gastro-esophageal reflux disease without esophagitis: Secondary | ICD-10-CM | POA: Diagnosis not present

## 2020-03-15 DIAGNOSIS — I2510 Atherosclerotic heart disease of native coronary artery without angina pectoris: Secondary | ICD-10-CM | POA: Insufficient documentation

## 2020-03-15 DIAGNOSIS — N400 Enlarged prostate without lower urinary tract symptoms: Secondary | ICD-10-CM | POA: Insufficient documentation

## 2020-03-15 DIAGNOSIS — Z791 Long term (current) use of non-steroidal anti-inflammatories (NSAID): Secondary | ICD-10-CM | POA: Diagnosis not present

## 2020-03-15 DIAGNOSIS — Z9842 Cataract extraction status, left eye: Secondary | ICD-10-CM | POA: Diagnosis not present

## 2020-03-15 DIAGNOSIS — I4819 Other persistent atrial fibrillation: Secondary | ICD-10-CM | POA: Diagnosis not present

## 2020-03-15 DIAGNOSIS — I483 Typical atrial flutter: Secondary | ICD-10-CM | POA: Insufficient documentation

## 2020-03-15 DIAGNOSIS — E039 Hypothyroidism, unspecified: Secondary | ICD-10-CM | POA: Insufficient documentation

## 2020-03-15 DIAGNOSIS — M1990 Unspecified osteoarthritis, unspecified site: Secondary | ICD-10-CM | POA: Insufficient documentation

## 2020-03-15 DIAGNOSIS — R002 Palpitations: Secondary | ICD-10-CM | POA: Diagnosis not present

## 2020-03-15 DIAGNOSIS — Z96651 Presence of right artificial knee joint: Secondary | ICD-10-CM | POA: Diagnosis not present

## 2020-03-15 DIAGNOSIS — Z79899 Other long term (current) drug therapy: Secondary | ICD-10-CM | POA: Insufficient documentation

## 2020-03-15 DIAGNOSIS — I5020 Unspecified systolic (congestive) heart failure: Secondary | ICD-10-CM | POA: Insufficient documentation

## 2020-03-15 DIAGNOSIS — I480 Paroxysmal atrial fibrillation: Secondary | ICD-10-CM | POA: Diagnosis present

## 2020-03-15 DIAGNOSIS — I428 Other cardiomyopathies: Secondary | ICD-10-CM | POA: Insufficient documentation

## 2020-03-15 DIAGNOSIS — M5431 Sciatica, right side: Secondary | ICD-10-CM | POA: Diagnosis not present

## 2020-03-15 DIAGNOSIS — Z7901 Long term (current) use of anticoagulants: Secondary | ICD-10-CM | POA: Diagnosis not present

## 2020-03-15 HISTORY — PX: CARDIOVERSION: SHX1299

## 2020-03-15 SURGERY — CARDIOVERSION
Anesthesia: General

## 2020-03-15 MED ORDER — PROPOFOL 10 MG/ML IV BOLUS
INTRAVENOUS | Status: AC
  Filled 2020-03-15: qty 20

## 2020-03-15 MED ORDER — SODIUM CHLORIDE 0.9 % IV SOLN: INTRAVENOUS | Status: DC | PRN

## 2020-03-15 MED ORDER — PROPOFOL 10 MG/ML IV BOLUS
INTRAVENOUS | Status: DC | PRN
  Administered 2020-03-15: 70 mg via INTRAVENOUS

## 2020-03-15 NOTE — Anesthesia Postprocedure Evaluation (Signed)
Anesthesia Post Note  Patient: Vincent [name]  Procedure(s) Performed: CARDIOVERSION (N/A )  Patient location during evaluation: PACU Anesthesia Type: General Level of consciousness: awake and alert Pain management: pain level controlled Vital Signs Assessment: post-procedure vital signs reviewed and stable Respiratory status: spontaneous breathing, nonlabored ventilation, respiratory function stable and patient connected to nasal cannula oxygen Cardiovascular status: blood pressure returned to baseline and stable Postop Assessment: no apparent nausea or vomiting Anesthetic complications: no     Last Vitals:  Vitals:   03/15/20 0800 03/15/20 0815  BP: 98/77 105/79  Pulse: (!) 49 (!) 51  Resp: (!) 22 15  Temp:    SpO2: 95% 98%    Last Pain:  Vitals:   03/15/20 0717  TempSrc: Oral                 Corinda Gubler

## 2020-03-15 NOTE — Interval H&P Note (Signed)
History and Physical Interval Note:  03/15/2020 7:53 AM  Vincent [name]  has presented today for surgery, with the diagnosis of Cardioversion   Afib.  The various methods of treatment have been discussed with the patient and family. After consideration of risks, benefits and other options for treatment, the patient has consented to  Procedure(s): CARDIOVERSION (N/A) as a surgical intervention.  The patient's history has been reviewed, patient examined, no change in status, stable for surgery.  I have reviewed the patient's chart and labs.  Questions were answered to the patient's satisfaction.     [name]

## 2020-03-15 NOTE — Anesthesia Preprocedure Evaluation (Addendum)
Anesthesia Evaluation  Patient identified by MRN, date of birth, ID band Patient awake    Reviewed: Allergy & Precautions, NPO status , Patient's Chart, lab work & pertinent test results  History of Anesthesia Complications Negative for: history of anesthetic complications  Airway Mallampati: II  TM Distance: >3 FB Neck ROM: Full    Dental no notable dental hx.    Pulmonary sleep apnea and Continuous Positive Airway Pressure Ventilation , neg COPD,    breath sounds clear to auscultation- rhonchi (-) wheezing      Cardiovascular Exercise Tolerance: Good + CAD and +CHF  (-) Past MI, (-) Cardiac Stents and (-) CABG + dysrhythmias Atrial Fibrillation  Rhythm:Regular Rate:Normal - Systolic murmurs and - Diastolic murmurs Echo 62/1308 1. Left ventricular ejection fraction, by visual estimation, is 50 to  55%. The left ventricle has low normal function. There is no left  ventricular hypertrophy.  2. Mildly dilated left ventricular internal cavity size.  3. The left ventricle has no regional wall motion abnormalities.  4. Global right ventricle has normal systolic function.The right  ventricular size is mildly enlarged. No increase in right ventricular wall  thickness.  5. Left atrial size was mildly dilated.  6. Right atrial size was normal.  7. The mitral valve is normal in structure. Trivial mitral valve  regurgitation.  8. The tricuspid valve is normal in structure.  9. The tricuspid valve is normal in structure. Tricuspid valve  regurgitation is not demonstrated.  10. The aortic valve is normal in structure. Aortic valve regurgitation is  not visualized.  11. The pulmonic valve was normal in structure. Pulmonic valve  regurgitation is trivial.  12. The inferior vena cava is normal in size with greater than 50%  respiratory variability, suggesting right atrial pressure of 3 mmHg.    Neuro/Psych neg Seizures negative  neurological ROS  negative psych ROS   GI/Hepatic Neg liver ROS, GERD  ,  Endo/Other  neg diabetesHypothyroidism   Renal/GU negative Renal ROS     Musculoskeletal  (+) Arthritis ,   Abdominal (+) - obese,   Peds  Hematology negative hematology ROS (+)   Anesthesia Other Findings Past Medical History: No date: Arthritis No date: Dizziness and giddiness No date: Dysrhythmia No date: GERD (gastroesophageal reflux disease) No date: Gout No date: History of NICM     Comment:  a. Prev EF as low as 35-40%;  b. 03/2012 Echo: EF 50-55%,              Gr 1 DD, mild MR;  c. 06/2014 Echo: EF 50-55%, gr 1 DD,               mild MR, mildly dil LA. No date: History of systolic CHF     Comment:  a. Prev EF as low as 35-40%;  b. 03/2012 Echo: EF 50-55%,              Gr 1 DD, mild MR;  c. 06/2014 Echo: EF 50-55%, gr 1 DD,               mild MR, mildly dil LA. No date: Hypothyroidism No date: Non-obstructive CAD     Comment:  a. 2009 nonobs dzs, LAD 20, EF 40-45%;  b. 07/2012 Ex MV:              EF 55%, freq pvc's, no ischemia/infarct. No date: PAF (paroxysmal atrial fibrillation) ([place])     Comment:  a. 11/2013 - occurred 3 wks post-op  Appe in setting of               pelvic abscess-->amio-->converted but amio later d/c'd               2/2 hypothyroidism. No date: Paroxysmal supraventricular tachycardia ([facility])     Comment:  a. SVT/PAT No date: Prostate enlargement No date: PSVT (paroxysmal supraventricular tachycardia) ([facility])     Comment:  a. 11/2013. No date: PVC's (premature ventricular contractions)     Comment:  a. Asymptomatic - managed with Toprol and Mg. No date: Sciatica of right side   Reproductive/Obstetrics                            Anesthesia Physical  Anesthesia Plan  ASA: III  Anesthesia Plan: General   Post-op Pain Management:    Induction: Intravenous  PONV Risk Score and Plan: 2 and Propofol infusion and Treatment may vary due to age or  medical condition  Airway Management Planned: Natural Airway  Additional Equipment: None  Intra-op Plan:   Post-operative Plan:   Informed Consent: I have reviewed the patients History and Physical, chart, labs and discussed the procedure including the risks, benefits and alternatives for the proposed anesthesia with the patient or authorized representative who has indicated his/her understanding and acceptance.     Dental advisory given  Plan Discussed with: CRNA and Anesthesiologist  Anesthesia Plan Comments: (Discussed risks of anesthesia with patient, including possibility of difficulty with spontaneous ventilation under anesthesia necessitating airway intervention, PONV, and rare risks such as cardiac or respiratory or neurological events. Patient understands.)        Anesthesia Quick Evaluation

## 2020-03-15 NOTE — CV Procedure (Signed)
Cardioversion note: A standard informed consent was obtained. Timeout was performed. The pads were placed in the anterior posterior fashion. The patient was given propofol by the anesthesia team.  Successful cardioversion was performed with a 200 J after a second shock. The patient converted to sinus rhythm. Pre-and post EKGs were reviewed. The patient tolerated the procedure with no immediate complications.  Recommendations: Continue same medications and follow-up in 2-3 weeks.

## 2020-03-15 NOTE — Transfer of Care (Signed)
Immediate Anesthesia Transfer of Care Note  Patient: Vincent [name]  Procedure(s) Performed: CARDIOVERSION (N/A )  Patient Location: Nursing Unit  Anesthesia Type:General  Level of Consciousness: drowsy and patient cooperative  Airway & Oxygen Therapy: Patient Spontanous Breathing and Patient connected to nasal cannula oxygen  Post-op Assessment: Report given to RN and Post -op Vital signs reviewed and stable  Post vital signs: Reviewed and stable  Last Vitals:  Vitals Value Taken Time  BP 107/79 03/15/20 0753  Temp    Pulse 51 03/15/20 0753  Resp 15 03/15/20 0753  SpO2 97 % 03/15/20 0753  Vitals shown include unvalidated device data.  Last Pain:  Vitals:   03/15/20 0717  TempSrc: Oral         Complications: No apparent anesthesia complications

## 2020-04-07 NOTE — Progress Notes (Signed)
Office Visit    Patient Name: Vincent [name] Date of Encounter: 04/08/2020  Primary Care Provider:  [name], MD Primary Cardiologist:  [name], MD  Chief Complaint    79 y/o ? with a h/o PAF/flutter, AVNRT s/p RFCA, mild LV dysfunction, nonobstructive CAD, and ventricular bigeminy, who presents for f/u after recent cardioversion for afib.  Past Medical History    Past Medical History:  Diagnosis Date  . Arthritis   . Dizziness and giddiness   . Dysrhythmia   . GERD (gastroesophageal reflux disease)   . Gout   . History of NICM    a. Prev EF as low as 35-40%;  b. 03/2012 Echo: EF 50-55%, Gr 1 DD, mild MR;  c. 06/2014 Echo: EF 50-55%, gr 1 DD, mild MR, mildly dil LA; d. 11/2019 Echo: EF 50-55%.  [name] History of systolic CHF    a. Prev EF as low as 35-40%;  b. 03/2012 Echo: EF 50-55%, Gr 1 DD, mild MR;  c. 06/2014 Echo: EF 50-55%, gr 1 DD; d. 11/2019 Echo: EF 50-55%, no rwma, mildly dil LA, nl RV fxn, triv MR/PR.  [name] Hypothyroidism   . Non-obstructive CAD    a. 2009 nonobs dzs, LAD 20, EF 40-45%;  b. 07/2012 Ex MV: EF 55%, freq pvc's, no ischemia/infarct.  [name] PAF (paroxysmal atrial fibrillation) (HCC)    a. 11/2013 - occurred 3 wks post-op Appe in setting of pelvic abscess-->amio-->converted but amio later d/c'd 2/2 hypothyroidism; b. 01/2020 recurrent rate-controlled Afib-->Eliquis-->s/p DCCV 02/2020.  . Paroxysmal Atrial flutter (HCC)    a. 12/2019 s/p RFCA.  . Paroxysmal supraventricular tachycardia (HCC)    a. SVT/PAT  . Prostate enlargement   . PSVT (paroxysmal supraventricular tachycardia) (HCC)    a. 12/2019 s/p RFCA.  . PVC's (premature ventricular contractions)    a. Asymptomatic - managed with Toprol and Mg.  . Sciatica of right side    Past Surgical History:  Procedure Laterality Date  . ABSCESS DRAINAGE    . APPENDECTOMY  2015  . BACK SURGERY    . CARDIAC CATHETERIZATION  2009  . CARDIOVERSION N/A 03/15/2020   Procedure: CARDIOVERSION;  Surgeon:  Iran Ouch, MD;  Location: [facility];  Service: Cardiovascular;  Laterality: N/A;  . CATARACT EXTRACTION Right   . CATARACT EXTRACTION W/PHACO Right 10/17/2017   Procedure: CATARACT EXTRACTION PHACO AND INTRAOCULAR LENS PLACEMENT (IOC) RIGHT;  Surgeon: [name], MD;  Location: [facility];  Service: Ophthalmology;  Laterality: Right;  . CATARACT EXTRACTION W/PHACO Left 12/24/2017   Procedure: CATARACT EXTRACTION PHACO AND INTRAOCULAR LENS PLACEMENT (IOC) LEFT;  Surgeon: [name], MD;  Location: [facility];  Service: Ophthalmology;  Laterality: Left;  . COLONOSCOPY    . COLONOSCOPY WITH PROPOFOL N/A 11/06/2018   Procedure: COLONOSCOPY WITH PROPOFOL;  Surgeon: Toledo, Boykin Nearing, MD;  Location: [facility];  Service: Gastroenterology;  Laterality: N/A;  . DUPUYTREN / PALMAR FASCIOTOMY Bilateral   . 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 HAND SURGERY  1999  . JOINT REPLACEMENT Right 2012   TKR  . PARTIAL KNEE ARTHROPLASTY Left   . SVT ABLATION N/A 12/29/2019   Procedure: SVT ABLATION;  Surgeon: Marinus Maw, MD;  Location: [facility];  Service: Cardiovascular;  Laterality: N/A;  . TOTAL KNEE ARTHROPLASTY Right 06/17/2017  Allergies  No Known Allergies  History of Present Illness    79 y/o ? w/ the above past medical history including persistent atrial fibrillation, paroxysmal atrial flutter, AVNRT, mild LV dysfunction/nonischemic cardiomyopathy, nonobstructive CAD, and ventricular bigeminy.  He previously underwent cardiac work-up for LV dysfunction with an EF as low as 35 to 40% at 1 point.  Underwent diagnostic catheterization in 2009 which showed a 20% stenosis in the LAD.  Most recent ischemic evaluation was in 2013 when exercise stress testing was  nonischemic.  EF has been variable over the years but most recently was 50-55% in January 2021.  In January 2015, he developed atrial fibrillation following an appendectomy.  He was initially managed with amiodarone but this was later discontinued in the setting of hypothyroidism.  He was anticoagulated with Eliquis.  In the setting of worsening palpitations in late 2020, he underwent event monitoring which showed episodes of SVT and atrial flutter.  He was referred to electrophysiology and subsequently underwent catheter ablation for AV nodal reentrant tachycardia and typical atrial flutter.  Eliquis was subsequently discontinued in late February but at EP follow-up on March 9, he was noted to be in persistent, rate controlled atrial fibrillation and Eliquis was resumed.  He remained in atrial fibrillation at follow-up on April 15 and subsequently underwent successful cardioversion on April 26.  Since his cardioversion, he has noted more dyspnea on exertion.  He has remained active over the past year, playing golf regularly and notes he has always had some degree of dyspnea on exertion with inclines though he feels as though in the past month or so, it has progressed some.  He denies chest pain, palpitations, PND, orthopnea, dizziness, syncope, edema, or early satiety.  Notably, precardioversion labs showed mild anemia with a hemoglobin of 12.8 and hematocrit of 39.2.  He was referred to primary care and ferritin was obtained on April 28 and was normal at 95.  He is scheduled for follow-up labs to primary care later this month.  He has not had any dark stools or bright red blood and previously tolerated Eliquis without bleeding.  Home Medications    Prior to Admission medications   Medication Sig Start Date End Date Taking? Authorizing Provider  acetaminophen (TYLENOL) 500 MG tablet Take 1,000 mg by mouth every 6 (six) hours as needed for moderate pain or headache.     [provider]  amoxicillin  (AMOXIL) 500 MG tablet Take 2,000 mg by mouth See admin instructions. Take 4 capsules (2000 mg) by mouth 1 hour prior to dental procedures.    [provider]  apixaban (ELIQUIS) 5 MG TABS tablet Take 1 tablet (5 mg total) by mouth 2 (two) times daily. 01/27/20   Marinus Maw, MD  Biotin 5000 MCG TABS Take 5,000 mcg by mouth daily.    [provider]  Carboxymethylcellul-Glycerin (LUBRICATING EYE DROPS OP) Apply 1 drop to eye 2 (two) times daily as needed (dry eyes).    [provider]  diclofenac (VOLTAREN) 75 MG EC tablet Take 75 mg by mouth 2 (two) times daily.    [provider]  FIBER THERAPY 500 MG TABS Take 500 mg by mouth 2 (two) times daily.    [provider]  finasteride (PROSCAR) 5 MG tablet Take 1 tablet (5 mg total) by mouth daily. 12/26/19   Stoioff, Verna Czech, MD  gabapentin (NEURONTIN) 300 MG capsule Take 300 mg by mouth 3 (three) times daily.     [provider]  HYDROcodone-acetaminophen (NORCO) 7.5-325 MG tablet Take 1 tablet by mouth every 6 (six) hours as needed for moderate pain. Patient not taking: Reported on 03/04/2020 11/28/17   Earnestine Leys, MD  levothyroxine (SYNTHROID, LEVOTHROID) 25 MCG tablet Take 25 mcg by mouth daily before breakfast.    [provider]  magnesium oxide (MAG-OX) 400 MG tablet Take 400 mg by mouth 2 (two) times daily.  09/03/12   [provider]  metoprolol succinate (TOPROL-XL) 50 MG 24 hr tablet Take 1 tablet (50 mg total) by mouth daily. 01/27/20   Evans Lance, MD  Omega-3 Fatty Acids (FISH OIL) 1000 MG CAPS Take 1,000 mg by mouth 2 (two) times daily.    [provider]  pantoprazole (PROTONIX) 40 MG tablet Take 40 mg by mouth daily.    [provider]  rosuvastatin (CRESTOR) 5 MG tablet Take 5 mg by mouth daily.  09/05/19 09/04/20  [provider]  sodium chloride (OCEAN) 0.65 % SOLN nasal spray Place 1 spray into both nostrils at bedtime.      [provider]  tamsulosin (FLOMAX) 0.4 MG CAPS capsule Take 1 capsule (0.4 mg total) by mouth daily. 12/26/19   Stoioff, Ronda Fairly, MD  vitamin B-12 (CYANOCOBALAMIN) 1000 MCG tablet Take 1,000 mcg by mouth daily.  08/29/19 08/28/20  [provider]  Zinc 50 MG TABS Take 50 mg by mouth daily.    [provider]    Review of Systems    Dyspnea on exertion which has progressed over the past month.  He denies chest pain, palpitations, PND, orthopnea, dizziness, syncope, edema, or early satiety.  All other systems reviewed and are otherwise negative except as noted above.  Physical Exam    VS:  BP 104/62 (BP Location: Left Arm, Patient Position: Sitting, Cuff Size: Normal)   Pulse 60   Ht 5' 10.5" (1.791 m)   Wt 224 lb (101.6 kg)   SpO2 98%   BMI 31.69 kg/m  , BMI Body mass index is 31.69 kg/m. GEN: Well nourished, well developed, in no acute distress. HEENT: normal. Neck: Supple, no JVD, carotid bruits, or masses. Cardiac: RRR, no murmurs, rubs, or gallops. No clubbing, cyanosis, edema.  Radials/PT 2+ and equal bilaterally.  Respiratory:  Respirations regular and unlabored, clear to auscultation bilaterally. GI: Soft, nontender, nondistended, BS + x 4. MS: no deformity or atrophy. Skin: warm and dry, no rash. Neuro:  Strength and sensation are intact. Psych: Normal affect.  Accessory Clinical Findings    ECG personally reviewed by me today -regular sinus rhythm, 60, left axis deviation, nonspecific T changes - no acute changes.  Lab Results  Component Value Date   WBC 4.7 03/09/2020   HGB 12.8 (L) 03/09/2020   HCT 39.2 03/09/2020   MCV 93.6 03/09/2020   PLT 170 03/09/2020   Lab Results  Component Value Date   CREATININE 1.17 03/04/2020   BUN 20 03/04/2020   NA 142 03/04/2020   K 4.5 03/04/2020   CL 104 03/04/2020   CO2 23 03/04/2020   Lab Results  Component Value Date   ALT 21 11/25/2014   AST 21 11/25/2014   ALKPHOS 76 11/25/2014   BILITOT  0.5 11/25/2014   Lab Results  Component Value Date   CHOL 218 10/25/2009   HDL 37.3 10/25/2009   LDLCALC 142.9 10/25/2009   TRIG 189 10/25/2009    Assessment & Plan    1.  Dyspnea on exertion/nonobstructive CAD: Patient notes a long  history of dyspnea on exertion, especially occurring with inclines but over the past month or so, specifically since his cardioversion, he has noted more progressive symptoms occurring while playing golf or doing yard work.  He denies any chest pain or palpitations.  He does have a history of nonobstructive CAD with catheterization in 2009 followed by low risk stress test in 2013.  I will arrange for a follow-up Lexiscan Myoview to rule out ischemia.  He is undergoing anemia evaluation by primary care with recent normal ferritin in the setting of a minimally low hemoglobin of 12.8.  He is due for follow-up labs later this month.  2.  PSVT/AVNRT: Status post catheter ablation in February.  No recurrent symptoms.  3.  Paroxysmal atrial flutter: Status post catheter ablation in February.  No recurrent symptoms.  4.  Persistent atrial fibrillation: Noted on clinic follow-up in March and now status post cardioversion in April.  No recurrent palpitations and maintaining sinus rhythm.  Heart rate well controlled on oral beta-blocker therapy and he is anticoagulated with Eliquis 5 mg twice daily in the setting of a CHA2DS2-VASc of 4.  5.  Hyperlipidemia: Followed by primary care.  Placed on rosuvastatin therapy following LDL of 146 in October 2020.  6.  PVCs: Quiescent on beta-blocker and magnesium therapy.  7.  Disposition: Follow-up Lexiscan Myoview as above.  He is due for follow-up lab work in the setting of very mild anemia through his primary care provider.  Follow-up in clinic in 1 month or sooner if necessary.  Nicolasa Ducking, NP 04/08/2020, 9:07 AM

## 2020-04-08 ENCOUNTER — Other Ambulatory Visit: Payer: Self-pay

## 2020-04-08 ENCOUNTER — Ambulatory Visit (INDEPENDENT_AMBULATORY_CARE_PROVIDER_SITE_OTHER): Payer: Medicare Other | Admitting: Nurse Practitioner

## 2020-04-08 ENCOUNTER — Encounter: Payer: Self-pay | Admitting: Nurse Practitioner

## 2020-04-08 VITALS — BP 104/62 | HR 60 | Ht 70.5 in | Wt 224.0 lb

## 2020-04-08 DIAGNOSIS — E782 Mixed hyperlipidemia: Secondary | ICD-10-CM

## 2020-04-08 DIAGNOSIS — I483 Typical atrial flutter: Secondary | ICD-10-CM

## 2020-04-08 DIAGNOSIS — I4819 Other persistent atrial fibrillation: Secondary | ICD-10-CM | POA: Diagnosis not present

## 2020-04-08 DIAGNOSIS — I471 Supraventricular tachycardia: Secondary | ICD-10-CM

## 2020-04-08 DIAGNOSIS — R0600 Dyspnea, unspecified: Secondary | ICD-10-CM

## 2020-04-08 DIAGNOSIS — R0609 Other forms of dyspnea: Secondary | ICD-10-CM

## 2020-04-08 DIAGNOSIS — I493 Ventricular premature depolarization: Secondary | ICD-10-CM

## 2020-04-08 DIAGNOSIS — I2510 Atherosclerotic heart disease of native coronary artery without angina pectoris: Secondary | ICD-10-CM | POA: Diagnosis not present

## 2020-04-08 NOTE — Patient Instructions (Addendum)
Medication Instructions:  Your physician recommends that you continue on your current medications as directed. Please refer to the Current Medication list given to you today.  *If you need a refill on your cardiac medications before your next appointment, please call your pharmacy*   Lab Work: None ordered If you have labs (blood work) drawn today and your tests are completely normal, you will receive your results only by: [name] MyChart Message (if you have MyChart) OR . A paper copy in the mail If you have any lab test that is abnormal or we need to change your treatment, we will call you to review the results.   Testing/Procedures: 1- [place]  Your caregiver has ordered a Stress Test with nuclear imaging. The purpose of this test is to evaluate the blood supply to your heart muscle. This procedure is referred to as a "Non-Invasive Stress Test." This is because other than having an IV started in your vein, nothing is inserted or "invades" your body. Cardiac stress tests are done to find areas of poor blood flow to the heart by determining the extent of coronary artery disease (CAD). Some patients exercise on a treadmill, which naturally increases the blood flow to your heart, while others who are  unable to walk on a treadmill due to physical limitations have a pharmacologic/chemical stress agent called Lexiscan . This medicine will mimic walking on a treadmill by temporarily increasing your coronary blood flow.   Please note: these test may take anywhere between 2-4 hours to complete  PLEASE REPORT TO [place] AT THE FIRST DESK WILL DIRECT YOU WHERE TO GO  Date of Procedure:_____________________________________  Arrival Time for Procedure:______________________________  Instructions regarding medication:     __x__:  Hold betablocker(s) night before procedure and morning of procedure (metorpolol)   PLEASE NOTIFY THE OFFICE AT LEAST 24 HOURS IN  ADVANCE IF YOU ARE UNABLE TO KEEP YOUR APPOINTMENT.  [phone] AND  PLEASE NOTIFY NUCLEAR MEDICINE AT [facility] AT LEAST 24 HOURS IN ADVANCE IF YOU ARE UNABLE TO KEEP YOUR APPOINTMENT. [phone]  How to prepare for your Myoview test:  1. Do not eat or drink after midnight 2. No caffeine for 24 hours prior to test 3. No smoking 24 hours prior to test. 4. Your medication may be taken with water.  If your doctor stopped a medication because of this test, do not take that medication. 5. Ladies, please do not wear dresses.  Skirts or pants are appropriate. Please wear a short sleeve shirt. 6. No perfume, cologne or lotion. 7. Wear comfortable walking shoes. No heels!    Follow-Up: At [facility], you and your health needs are our priority.  As part of our continuing mission to provide you with exceptional heart care, we have created designated Provider Care Teams.  These Care Teams include your primary Cardiologist (physician) and Advanced Practice Providers (APPs -  Physician Assistants and Nurse Practitioners) who all work together to provide you with the care you need, when you need it.  We recommend signing up for the patient portal called "MyChart".  Sign up information is provided on this After Visit Summary.  MyChart is used to connect with patients for Virtual Visits (Telemedicine).  Patients are able to view lab/test results, encounter notes, upcoming appointments, etc.  Non-urgent messages can be sent to your provider as well.   To learn more about what you can do with MyChart, go to [email or web address].    Your next appointment:  1 month(s)  The format for your next appointment:   In Person  Provider:    You may see [name], MD or Nicolasa Ducking, NP

## 2020-04-13 ENCOUNTER — Encounter
Admission: RE | Admit: 2020-04-13 | Discharge: 2020-04-13 | Disposition: A | Discharge status: Home / Self Care | Payer: Medicare Other | Source: Ambulatory Visit | Attending: Nurse Practitioner | Admitting: Nurse Practitioner

## 2020-04-13 ENCOUNTER — Other Ambulatory Visit: Payer: Self-pay

## 2020-04-13 DIAGNOSIS — R0600 Dyspnea, unspecified: Secondary | ICD-10-CM | POA: Diagnosis present

## 2020-04-13 DIAGNOSIS — R0609 Other forms of dyspnea: Secondary | ICD-10-CM

## 2020-04-13 LAB — NM MYOCAR MULTI W/SPECT W/WALL MOTION / EF
LV dias vol: 102 mL (ref 62–150)
LV sys vol: 42 mL
Peak HR: 78 {beats}/min
Percent HR: 55 %
Rest HR: 49 {beats}/min
TID: 0.75

## 2020-04-13 MED ORDER — REGADENOSON 0.4 MG/5ML IV SOLN
0.4000 mg | Freq: Once | INTRAVENOUS | Status: AC
  Administered 2020-04-13: 0.4 mg via INTRAVENOUS

## 2020-04-13 MED ORDER — TECHNETIUM TC 99M TETROFOSMIN IV KIT
10.9300 | PACK | Freq: Once | INTRAVENOUS | Status: AC | PRN
  Administered 2020-04-13: 10.6 via INTRAVENOUS

## 2020-04-13 MED ORDER — TECHNETIUM TC 99M TETROFOSMIN IV KIT
31.6400 | PACK | Freq: Once | INTRAVENOUS | Status: AC | PRN
  Administered 2020-04-13: 31.64 via INTRAVENOUS

## 2020-04-14 ENCOUNTER — Telehealth: Payer: Self-pay

## 2020-04-14 NOTE — Telephone Encounter (Signed)
Patient returning call.

## 2020-04-14 NOTE — Telephone Encounter (Signed)
Call attempted

## 2020-04-14 NOTE — Telephone Encounter (Signed)
Lexiscan myoview with no ischemia. CT images show no significant coronary calcification. Recent echocardiogram with normal pumping function. Chest discomfort likely non-cardiac.   He has known anemia following with primary care with could be contributory to dyspnea, fatigue.   May want to discuss additional workup via labs for fatigue with primary care (typically thyroid, vitamin D, etc).   Cannot exclude that some bradycardia could be contributory to fatigue. If his heart rate is consistently less than 60bpm could discuss reduced dose of Toprol, but would prefer to have home readings to assess.   Vincent Sorrow, NP

## 2020-04-14 NOTE — Telephone Encounter (Signed)
-----   Message from Creig Hines, NP sent at 04/14/2020  7:37 AM EDT ----- Low risk stress test. No significant coronary calcification.  Very reassuring.

## 2020-04-14 NOTE — Telephone Encounter (Signed)
Call to patient to discuss recent stress test results. Pt reports concern for ongoing fatigue.   VS today 110/70s, HR 60s.  HR goes to high 40s and 50s at times. SBP mostly in 110-120 range.  VS taken each morning.   Takes metoprolol in the evening.  Having fatigue while playing golf in the morning. Plays several times per week, playing 18 holes. He is walking the course most of the time. Last 9 are hardest for fatigue.   Having chest discomfort periodically during the day. Denies cough. Denies recent illness. Denies smoking hx.   Denies leg swelling.   Pt due for follow up next month. He will attempt to send in vs readings via mychart.

## 2020-04-14 NOTE — Telephone Encounter (Signed)
Attempted to call patient. LMTCB 04/14/2020

## 2020-04-15 NOTE — Telephone Encounter (Signed)
Attempted to call patient. LMTCB 04/15/2020

## 2020-04-15 NOTE — Telephone Encounter (Signed)
Patient calling to discuss recent testing results  ° °Please call  ° °

## 2020-04-16 NOTE — Telephone Encounter (Signed)
Call to patient to discuss POC from Gillian Shields, NP. Pt verbalized understanding and agreeable to POC>   He will call next week with some vital signs.

## 2020-05-11 ENCOUNTER — Other Ambulatory Visit: Payer: Self-pay

## 2020-05-11 ENCOUNTER — Ambulatory Visit (INDEPENDENT_AMBULATORY_CARE_PROVIDER_SITE_OTHER): Payer: Medicare Other | Admitting: Family

## 2020-05-11 ENCOUNTER — Encounter: Payer: Self-pay | Admitting: Family

## 2020-05-11 VITALS — BP 110/90 | HR 46 | Ht 70.5 in | Wt 223.2 lb

## 2020-05-11 DIAGNOSIS — I493 Ventricular premature depolarization: Secondary | ICD-10-CM

## 2020-05-11 DIAGNOSIS — I471 Supraventricular tachycardia: Secondary | ICD-10-CM | POA: Diagnosis not present

## 2020-05-11 DIAGNOSIS — I483 Typical atrial flutter: Secondary | ICD-10-CM | POA: Diagnosis not present

## 2020-05-11 DIAGNOSIS — I480 Paroxysmal atrial fibrillation: Secondary | ICD-10-CM

## 2020-05-11 DIAGNOSIS — R0600 Dyspnea, unspecified: Secondary | ICD-10-CM

## 2020-05-11 DIAGNOSIS — R0609 Other forms of dyspnea: Secondary | ICD-10-CM

## 2020-05-11 DIAGNOSIS — I2510 Atherosclerotic heart disease of native coronary artery without angina pectoris: Secondary | ICD-10-CM

## 2020-05-11 DIAGNOSIS — R5383 Other fatigue: Secondary | ICD-10-CM | POA: Diagnosis not present

## 2020-05-11 MED ORDER — METOPROLOL SUCCINATE ER 25 MG PO TB24: 25.0000 mg | ORAL_TABLET | Freq: Every day | ORAL | 1 refills | Status: DC

## 2020-05-11 NOTE — Progress Notes (Signed)
Office Visit    Patient Name: Vincent [name] Date of Encounter: 05/11/2020  Primary Care Provider:  [name], MD Primary Cardiologist:  [name], MD Electrophysiologist:  None   Chief Complaint    Vincent [name] is a 79 y.o. male with a hx of paroxysmal atrial fib/flutter, AVNRT s/p RF CA, mild LV dysfunction, nonobstructive CAD, ventricular bigeminy presents today for follow up after Lexiscan Myoview.    Past Medical History    Past Medical History:  Diagnosis Date  . Arthritis   . Dizziness and giddiness   . Dysrhythmia   . GERD (gastroesophageal reflux disease)   . Gout   . History of NICM    a. Prev EF as low as 35-40%;  b. 03/2012 Echo: EF 50-55%, Gr 1 DD, mild MR;  c. 06/2014 Echo: EF 50-55%, gr 1 DD, mild MR, mildly dil LA; d. 11/2019 Echo: EF 50-55%.  [name] History of systolic CHF    a. Prev EF as low as 35-40%;  b. 03/2012 Echo: EF 50-55%, Gr 1 DD, mild MR;  c. 06/2014 Echo: EF 50-55%, gr 1 DD; d. 11/2019 Echo: EF 50-55%, no rwma, mildly dil LA, nl RV fxn, triv MR/PR.  [name] Hypothyroidism   . Non-obstructive CAD    a. 2009 nonobs dzs, LAD 20, EF 40-45%;  b. 07/2012 Ex MV: EF 55%, freq pvc's, no ischemia/infarct.  [name] PAF (paroxysmal atrial fibrillation) (HCC)    a. 11/2013 - occurred 3 wks post-op Appe in setting of pelvic abscess-->amio-->converted but amio later d/c'd 2/2 hypothyroidism; b. 01/2020 recurrent rate-controlled Afib-->Eliquis-->s/p DCCV 02/2020.  . Paroxysmal Atrial flutter (HCC)    a. 12/2019 s/p RFCA.  . Paroxysmal supraventricular tachycardia (HCC)    a. SVT/PAT  . Prostate enlargement   . PSVT (paroxysmal supraventricular tachycardia) (HCC)    a. 12/2019 s/p RFCA.  . PVC's (premature ventricular contractions)    a. Asymptomatic - managed with Toprol and Mg.  . Sciatica of right side    Past Surgical History:  Procedure Laterality Date  . ABSCESS DRAINAGE    . APPENDECTOMY  2015  . BACK SURGERY    . CARDIAC CATHETERIZATION  2009  .  CARDIOVERSION N/A 03/15/2020   Procedure: CARDIOVERSION;  Surgeon: Iran Ouch, MD;  Location: [facility];  Service: Cardiovascular;  Laterality: N/A;  . CATARACT EXTRACTION Right   . CATARACT EXTRACTION W/PHACO Right 10/17/2017   Procedure: CATARACT EXTRACTION PHACO AND INTRAOCULAR LENS PLACEMENT (IOC) RIGHT;  Surgeon: [name], MD;  Location: [facility];  Service: Ophthalmology;  Laterality: Right;  . CATARACT EXTRACTION W/PHACO Left 12/24/2017   Procedure: CATARACT EXTRACTION PHACO AND INTRAOCULAR LENS PLACEMENT (IOC) LEFT;  Surgeon: [name], MD;  Location: [facility];  Service: Ophthalmology;  Laterality: Left;  . COLONOSCOPY    . COLONOSCOPY WITH PROPOFOL N/A 11/06/2018   Procedure: COLONOSCOPY WITH PROPOFOL;  Surgeon: Toledo, Boykin Nearing, MD;  Location: [facility];  Service: Gastroenterology;  Laterality: N/A;  . DUPUYTREN / PALMAR FASCIOTOMY Bilateral   . 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 HAND SURGERY  1999  . JOINT REPLACEMENT Right 2012   TKR  . PARTIAL KNEE ARTHROPLASTY Left   . SVT ABLATION N/A 12/29/2019   Procedure: SVT ABLATION;  Surgeon: Marinus Maw, MD;  Location: [facility] CV LAB;  Service:  Cardiovascular;  Laterality: N/A;  . TOTAL KNEE ARTHROPLASTY Right 06/17/2017    Allergies  No Known Allergies  History of Present Illness    Vincent [name] is a 79 y.o. male with a hx of paroxysmal atrial fib/flutter, AVNRT s/p RF CA, mild LV dysfunction, nonobstructive CAD, ventricular bigeminy last seen 04/08/2020 by Ignacia Bayley, NP.  Previously underwent cardiac work-up for LV dysfunction with EF as low as 35-40% at 1 point.  Underwent diagnostic catheterization 2009 showed 20% stenosis in LAD.  Exercise stress test in 2013 was nonischemic.  EF has  been variable over the years but most recently 50-55% in January 2021.  In January 2015 he developed atrial fibrillation following an appendectomy.  Initially managed with amiodarone but later discontinued in the setting of hypothyroidism.  He was anticoagulated with Eliquis.  Due to worsening palpitations late 2020 he underwent event monitoring which showed episodes of SVT and atrial flutter.  He was referred to EP and subsequently underwent catheter ablation for AV nodal reentrant tachycardia and typical atrial flutter.  Eliquis subsequently discontinued late February but EP follow-up March not he was noted to be persistent, rate controlled atrial fibrillation and Eliquis was resumed.  He remained in atrial fibrillation at follow-up April 15 and subsequently underwent successful cardioversion April 26.  At clinic visit 04/08/2020 he noted more dyspnea on exertion.  He was recommended for Lexiscan Myoview to rule out ischemia.  He was undergoing anemia evaluation by primary care with normal ferritin in the setting of minimally low hemoglobin of 12.8. Subsequent stress test 04/13/20 was low risk with no significant coronary calcification.  Chief complaint today of fatigue. When asked about palpitations tells me he sometimes feels his heart racing but tells me that this does not bother him. Checks BP and HR at home on arm cuff. HR routinely 84O-96E and systolic blood pressure routinely 100-115. Will get lightheaded "very seldom". Reports no problems with his breathing. Tells me he noticed he will get a pin or "stick" sensation right where they did the cardioversion, but no chest pain, pressure, tightness.  EKGs/Labs/Other Studies Reviewed:   The following studies were reviewed today: Lexiscan Myoview 04/13/20  Low risk, probably normal pharmacologic myocardial perfusion stress test.  There is a small in size, mild in severity, fixed apical lateral and mid inferolateral defect most consistent with artifact  but cannot rule out an element of scar.  There is no significant ischemia.  The left ventricular ejection fraction is normal by visual estimation and Siemens calculation (55-65%). QGS calculation of 36% is likely artifactually low.  There is no significant coronary artery calcification on the attenuation correction CT.   EKG:  EKG is ordered today.  The ekg ordered today demonstrates SB 46 bpm with no acute ST/T wave changes.  Recent Labs: 03/04/2020: BUN 20; Creatinine, Ser 1.17; Potassium 4.5; Sodium 142 03/09/2020: Hemoglobin 12.8; Platelets 170  Recent Lipid Panel    Component Value Date/Time   CHOL 218 10/25/2009 0000   TRIG 189 10/25/2009 0000   HDL 37.3 10/25/2009 0000   LDLCALC 142.9 10/25/2009 0000    Home Medications   Current Meds  Medication Sig  . acetaminophen (TYLENOL) 500 MG tablet Take 1,000 mg by mouth every 6 (six) hours as needed for moderate pain or headache.   [name] amoxicillin (AMOXIL) 500 MG tablet Take 2,000 mg by mouth See admin instructions. Take 4 capsules (2000 mg) by mouth 1 hour prior to dental procedures.  [name] apixaban (ELIQUIS) 5 MG TABS  tablet Take 1 tablet (5 mg total) by mouth 2 (two) times daily.  . Biotin 5000 MCG TABS Take 5,000 mcg by mouth daily.  . Carboxymethylcellul-Glycerin (LUBRICATING EYE DROPS OP) Apply 1 drop to eye 2 (two) times daily as needed (dry eyes).  [name] diclofenac (VOLTAREN) 75 MG EC tablet Take 75 mg by mouth 2 (two) times daily.  [name] FIBER THERAPY 500 MG TABS Take 500 mg by mouth 2 (two) times daily.  . finasteride (PROSCAR) 5 MG tablet Take 1 tablet (5 mg total) by mouth daily.  [name] gabapentin (NEURONTIN) 300 MG capsule Take 300 mg by mouth 3 (three) times daily.   [name] HYDROcodone-acetaminophen (NORCO) 7.5-325 MG tablet Take 1 tablet by mouth every 6 (six) hours as needed for moderate pain.  [name] levothyroxine (SYNTHROID, LEVOTHROID) 25 MCG tablet Take 25 mcg by mouth daily before breakfast.  . magnesium oxide (MAG-OX) 400 MG tablet Take  400 mg by mouth 2 (two) times daily.   . Omega-3 Fatty Acids (FISH OIL) 1000 MG CAPS Take 1,000 mg by mouth 2 (two) times daily.  . pantoprazole (PROTONIX) 40 MG tablet Take 40 mg by mouth daily.  . rosuvastatin (CRESTOR) 5 MG tablet Take 5 mg by mouth daily.   . sodium chloride (OCEAN) 0.65 % SOLN nasal spray Place 1 spray into both nostrils at bedtime.   . tamsulosin (FLOMAX) 0.4 MG CAPS capsule Take 1 capsule (0.4 mg total) by mouth daily.  . vitamin B-12 (CYANOCOBALAMIN) 1000 MCG tablet Take 1,000 mcg by mouth daily.   . Zinc 50 MG TABS Take 50 mg by mouth daily.  . [DISCONTINUED] metoprolol succinate (TOPROL-XL) 50 MG 24 hr tablet Take 1 tablet (50 mg total) by mouth daily.    Review of Systems   Review of Systems  Constitutional: Positive for malaise/fatigue. Negative for chills and fever.  Cardiovascular: Negative for chest pain, dyspnea on exertion, irregular heartbeat, leg swelling, near-syncope, orthopnea, palpitations and syncope.  Respiratory: Negative for cough, shortness of breath and wheezing.   Gastrointestinal: Negative for melena, nausea and vomiting.  Genitourinary: Negative for hematuria.  Neurological: Negative for dizziness, light-headedness and weakness.   All other systems reviewed and are otherwise negative except as noted above.  Physical Exam    VS:  BP 110/90 (BP Location: Left Arm, Patient Position: Sitting, Cuff Size: Normal)   Pulse (!) 46   Ht 5' 10.5" (1.791 m)   Wt 223 lb 4 oz (101.3 kg)   SpO2 98%   BMI 31.58 kg/m  , BMI Body mass index is 31.58 kg/m. GEN: Well nourished, well developed, in no acute distress. HEENT: normal. Neck: Supple, no JVD, carotid bruits, or masses. Cardiac: bradycardia, RRR, no murmurs, rubs, or gallops. No clubbing, cyanosis, edema.  Radials/DP/PT 2+ and equal bilaterally.  Respiratory:  Respirations regular and unlabored, clear to auscultation bilaterally. GI: Soft, nontender, nondistended, BS + x 4. MS: No deformity  or atrophy. Skin: Warm and dry, no rash. Neuro:  Strength and sensation are intact. Psych: Normal affect.  Assessment & Plan    1. DOE/nonobstructive CAD - Lexiscan Myoview 04/13/20 low risk with no evidence of ischemia. Tells me his DOE has resolved. Continues with fatigue, which is likely bradycardia-induced as below. No indication for ischemia eval at this time. Continue GDMT beta blocker, statin. No aspirin secondary to chronic anticoagulation.   2. PSVT/AVNRT -s/p catheter ablation 12/2019. No evidence of recurrence.  3. Paroxysmal atrial flutter -s/p catheter ablation 12/2019. Maintaining sinus rhythm.  4. Persistent  atrial fibrillation -noted on clinic follow-up in March with DCCV in April.  CHA2DS2-VASc score of 4. Maintaining sinus, however notably bradycardic on exam today 46 bpm with associated fatigue. Reduce Metoprolol Succinate to 25mg  daily. If bradycardia persists, consider further dose reduction. Continue Eliquis 5mg  twice daily, does not meet criteria for dose reduction. Denies bleeding complications.   5. HLD - Continue Rosuvastatin. Continue to follow with primary care.   6. PVC - No recurrence on beta blocker therapy and magnesium.   Disposition: Follow up in 2 month(s) with Dr. or APP   , NP 05/11/2020, 12:17 PM

## 2020-05-11 NOTE — Patient Instructions (Addendum)
Medication Instructions:  Your physician has recommended you make the following change in your medication:   CHANGE your Metoprolol Succinate (Toprol) to 25mg  *half tablet*  *If you need a refill on your cardiac medications before your next appointment, please call your pharmacy*  Lab Work: None ordered today  Testing/Procedures: None ordered today.  Your EKG today showed sinus bradycardia which is a slow but regular heart rhythm.  Your stress test showed no blockages and normal pumping function. This is a great result!  Follow-Up: At [facility], you and your health needs are our priority.  As part of our continuing mission to provide you with exceptional heart care, we have created designated Provider Care Teams.  These Care Teams include your primary Cardiologist (physician) and Advanced Practice Providers (APPs -  Physician Assistants and Nurse Practitioners) who all work together to provide you with the care you need, when you need it.  We recommend signing up for the patient portal called "MyChart".  Sign up information is provided on this After Visit Summary.  MyChart is used to connect with patients for Virtual Visits (Telemedicine).  Patients are able to view lab/test results, encounter notes, upcoming appointments, etc.  Non-urgent messages can be sent to your provider as well.   To learn more about what you can do with MyChart, go to [facility].    Your next appointment:   2 month(s)  The format for your next appointment:   In Person  Provider:    You may see [email or web address], MD or [name], NP

## 2020-05-21 ENCOUNTER — Encounter: Payer: Self-pay | Admitting: Family

## 2020-07-15 ENCOUNTER — Encounter: Payer: Self-pay | Admitting: Family

## 2020-07-15 ENCOUNTER — Other Ambulatory Visit: Payer: Self-pay

## 2020-07-15 ENCOUNTER — Ambulatory Visit (INDEPENDENT_AMBULATORY_CARE_PROVIDER_SITE_OTHER): Payer: Medicare Other | Admitting: Family

## 2020-07-15 VITALS — BP 110/62 | HR 58 | Ht 70.5 in | Wt 221.0 lb

## 2020-07-15 DIAGNOSIS — R002 Palpitations: Secondary | ICD-10-CM

## 2020-07-15 DIAGNOSIS — I483 Typical atrial flutter: Secondary | ICD-10-CM | POA: Diagnosis not present

## 2020-07-15 DIAGNOSIS — Z7901 Long term (current) use of anticoagulants: Secondary | ICD-10-CM

## 2020-07-15 DIAGNOSIS — I471 Supraventricular tachycardia, unspecified: Secondary | ICD-10-CM

## 2020-07-15 DIAGNOSIS — E785 Hyperlipidemia, unspecified: Secondary | ICD-10-CM

## 2020-07-15 DIAGNOSIS — I493 Ventricular premature depolarization: Secondary | ICD-10-CM

## 2020-07-15 DIAGNOSIS — I2510 Atherosclerotic heart disease of native coronary artery without angina pectoris: Secondary | ICD-10-CM

## 2020-07-15 DIAGNOSIS — I480 Paroxysmal atrial fibrillation: Secondary | ICD-10-CM

## 2020-07-15 DIAGNOSIS — E782 Mixed hyperlipidemia: Secondary | ICD-10-CM

## 2020-07-15 NOTE — Progress Notes (Signed)
Office Visit    Patient Name: Vincent [name] Date of Encounter: 07/15/2020  Primary Care Provider:  [name], MD Primary Cardiologist:  [name], MD Electrophysiologist:  None   Chief Complaint    Vincent [name] is a 79 y.o. male with a hx of paroxysmal atrial fib/flutter, AVNRT s/p RF CA, mild LV dysfunction, nonobstructive CAD, ventricular bigeminy presents today for follow up of arrhythmia, CAD.    Past Medical History    Past Medical History:  Diagnosis Date  . Arthritis   . Dizziness and giddiness   . Dysrhythmia   . GERD (gastroesophageal reflux disease)   . Gout   . History of NICM    a. Prev EF as low as 35-40%;  b. 03/2012 Echo: EF 50-55%, Gr 1 DD, mild MR;  c. 06/2014 Echo: EF 50-55%, gr 1 DD, mild MR, mildly dil LA; d. 11/2019 Echo: EF 50-55%.  [name] History of systolic CHF    a. Prev EF as low as 35-40%;  b. 03/2012 Echo: EF 50-55%, Gr 1 DD, mild MR;  c. 06/2014 Echo: EF 50-55%, gr 1 DD; d. 11/2019 Echo: EF 50-55%, no rwma, mildly dil LA, nl RV fxn, triv MR/PR.  [name] Hypothyroidism   . Non-obstructive CAD    a. 2009 nonobs dzs, LAD 20, EF 40-45%;  b. 07/2012 Ex MV: EF 55%, freq pvc's, no ischemia/infarct.  [name] PAF (paroxysmal atrial fibrillation) (HCC)    a. 11/2013 - occurred 3 wks post-op Appe in setting of pelvic abscess-->amio-->converted but amio later d/c'd 2/2 hypothyroidism; b. 01/2020 recurrent rate-controlled Afib-->Eliquis-->s/p DCCV 02/2020.  . Paroxysmal Atrial flutter (HCC)    a. 12/2019 s/p RFCA.  . Paroxysmal supraventricular tachycardia (HCC)    a. SVT/PAT  . Prostate enlargement   . PSVT (paroxysmal supraventricular tachycardia) (HCC)    a. 12/2019 s/p RFCA.  . PVC's (premature ventricular contractions)    a. Asymptomatic - managed with Toprol and Mg.  . Sciatica of right side    Past Surgical History:  Procedure Laterality Date  . ABSCESS DRAINAGE    . APPENDECTOMY  2015  . BACK SURGERY    . CARDIAC CATHETERIZATION  2009  .  CARDIOVERSION N/A 03/15/2020   Procedure: CARDIOVERSION;  Surgeon: Iran Ouch, MD;  Location: [facility];  Service: Cardiovascular;  Laterality: N/A;  . CATARACT EXTRACTION Right   . CATARACT EXTRACTION W/PHACO Right 10/17/2017   Procedure: CATARACT EXTRACTION PHACO AND INTRAOCULAR LENS PLACEMENT (IOC) RIGHT;  Surgeon: [name], MD;  Location: [facility];  Service: Ophthalmology;  Laterality: Right;  . CATARACT EXTRACTION W/PHACO Left 12/24/2017   Procedure: CATARACT EXTRACTION PHACO AND INTRAOCULAR LENS PLACEMENT (IOC) LEFT;  Surgeon: [name], MD;  Location: [facility];  Service: Ophthalmology;  Laterality: Left;  . COLONOSCOPY    . COLONOSCOPY WITH PROPOFOL N/A 11/06/2018   Procedure: COLONOSCOPY WITH PROPOFOL;  Surgeon: Toledo, Boykin Nearing, MD;  Location: [facility];  Service: Gastroenterology;  Laterality: N/A;  . DUPUYTREN / PALMAR FASCIOTOMY Bilateral   . 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 HAND SURGERY  1999  . JOINT REPLACEMENT Right 2012   TKR  . PARTIAL KNEE ARTHROPLASTY Left   . SVT ABLATION N/A 12/29/2019   Procedure: SVT ABLATION;  Surgeon: Marinus Maw, MD;  Location: [facility];  Service: Cardiovascular;  Laterality: N/A;  . TOTAL KNEE ARTHROPLASTY Right 06/17/2017    Allergies  No Known Allergies  History of Present Illness    Vincent [name] is a 79 y.o. male with a hx of paroxysmal atrial fib/flutter, AVNRT s/p RF CA, mild LV dysfunction, nonobstructive CAD, ventricular bigeminy last seen 05/11/20.  Previously underwent cardiac work-up for LV dysfunction with EF as low as 35-40% at 1 point.  Underwent diagnostic catheterization 2009 showed 20% stenosis in LAD.  Exercise stress test in 2013 was nonischemic.  EF has been variable over the  years but most recently 50-55% in January 2021.  In January 2015 he developed atrial fibrillation following an appendectomy.  Initially managed with amiodarone but later discontinued in the setting of hypothyroidism.  He was anticoagulated with Eliquis.  Due to worsening palpitations late 2020 he underwent event monitoring which showed episodes of SVT and atrial flutter.  He was referred to EP and subsequently underwent catheter ablation for AV nodal reentrant tachycardia and typical atrial flutter.  Eliquis subsequently discontinued late February but EP follow-up March not he was noted to be persistent, rate controlled atrial fibrillation and Eliquis was resumed.  He remained in atrial fibrillation at follow-up April 15 and subsequently underwent successful cardioversion April 26.  At clinic visit 04/08/2020 he noted more dyspnea on exertion.  He was recommended for Lexiscan Myoview to rule out ischemia.  He was undergoing anemia evaluation by primary care with normal ferritin in the setting of minimally low hemoglobin of 12.8. Subsequent stress test 04/13/20 was low risk with no significant coronary calcification.  When seen in clinic 05/11/20 in follow up noted to be bradycardic with HR at home 50s-60s and his Metoprolol Succinate was reduced to 25mg  daily.  05/14/20 labs via Care Everywhere show Hb 12.7.  Reports feeling overall well.  Continues to enjoy playing golf 3 times per week.  Reports no chest pain, pressure, tightness.  Reports improvement in his heart rate on monitor at home since reduced dose of metoprolol.  Denies lightheadedness, dizziness, near syncope.  His blood pressure at home is routinely 90-110/68-70.  Reports his dyspnea on exertion is stable at baseline.  He reports rare palpitations that are not bothersome and not associated with pain or shortness of breath.  Tells me these are fleeting.  EKGs/Labs/Other Studies Reviewed:   The following studies were reviewed today:  Lexiscan  Myoview 04/13/20  Low risk, probably normal pharmacologic myocardial perfusion stress test.  There is a small in size, mild in severity, fixed apical lateral and mid inferolateral defect most consistent with artifact but cannot rule out an element of scar.  There is no significant ischemia.  The left ventricular ejection fraction is normal by visual estimation and Siemens calculation (55-65%). QGS calculation of 36% is likely artifactually low.  There is no significant coronary artery calcification on the attenuation correction CT.   EKG:  EKG is ordered today.  The ekg ordered today demonstrates SB 58 bpm with no acute ST/T wave changes.  Recent Labs: 03/04/2020: BUN 20; Creatinine, Ser 1.17; Potassium 4.5; Sodium 142 03/09/2020: Hemoglobin 12.8; Platelets 170  Recent Lipid Panel    Component Value Date/Time   CHOL 218 10/25/2009 0000   TRIG 189 10/25/2009 0000   HDL 37.3 10/25/2009 0000   LDLCALC 142.9 10/25/2009 0000    Home Medications   Current Meds  Medication Sig  . acetaminophen (TYLENOL) 500 MG tablet Take 1,000 mg by mouth every 6 (six) hours as needed for moderate pain  or headache.   [name] amoxicillin (AMOXIL) 500 MG tablet Take 2,000 mg by mouth See admin instructions. Take 4 capsules (2000 mg) by mouth 1 hour prior to dental procedures.  [name] apixaban (ELIQUIS) 5 MG TABS tablet Take 1 tablet (5 mg total) by mouth 2 (two) times daily.  . Biotin 5000 MCG TABS Take 5,000 mcg by mouth daily.  . Carboxymethylcellul-Glycerin (LUBRICATING EYE DROPS OP) Apply 1 drop to eye 2 (two) times daily as needed (dry eyes).  [name] diclofenac (VOLTAREN) 75 MG EC tablet Take 75 mg by mouth 2 (two) times daily.  [name] FIBER THERAPY 500 MG TABS Take 500 mg by mouth 2 (two) times daily.  . finasteride (PROSCAR) 5 MG tablet Take 1 tablet (5 mg total) by mouth daily.  [name] gabapentin (NEURONTIN) 300 MG capsule Take 300 mg by mouth 3 (three) times daily.   [name] HYDROcodone-acetaminophen (NORCO) 7.5-325 MG tablet  Take 1 tablet by mouth every 6 (six) hours as needed for moderate pain.  [name] levothyroxine (SYNTHROID, LEVOTHROID) 25 MCG tablet Take 25 mcg by mouth daily before breakfast.  . magnesium oxide (MAG-OX) 400 MG tablet Take 400 mg by mouth 2 (two) times daily.   . metoprolol succinate (TOPROL XL) 25 MG 24 hr tablet Take 1 tablet (25 mg total) by mouth daily.  . pantoprazole (PROTONIX) 40 MG tablet Take 40 mg by mouth daily.  . rosuvastatin (CRESTOR) 5 MG tablet Take 5 mg by mouth daily.   . sodium chloride (OCEAN) 0.65 % SOLN nasal spray Place 1 spray into both nostrils at bedtime.   . tamsulosin (FLOMAX) 0.4 MG CAPS capsule Take 1 capsule (0.4 mg total) by mouth daily.  . vitamin B-12 (CYANOCOBALAMIN) 1000 MCG tablet Take 1,000 mcg by mouth daily.   . Zinc 50 MG TABS Take 50 mg by mouth daily.    Review of Systems   Review of Systems  Constitutional: Negative for chills, fever and malaise/fatigue.  Cardiovascular: Positive for dyspnea on exertion. Negative for chest pain, irregular heartbeat, leg swelling, near-syncope, orthopnea, palpitations and syncope.  Respiratory: Negative for cough, shortness of breath and wheezing.   Gastrointestinal: Negative for melena, nausea and vomiting.  Genitourinary: Negative for hematuria.  Neurological: Negative for dizziness, light-headedness and weakness.   All other systems reviewed and are otherwise negative except as noted above.  Physical Exam    VS:  BP 110/62 (BP Location: Left Arm, Patient Position: Sitting, Cuff Size: Normal)   Pulse (!) 58   Ht 5' 10.5" (1.791 m)   Wt 221 lb (100.2 kg)   BMI 31.26 kg/m  , BMI Body mass index is 31.26 kg/m. GEN: Well nourished, well developed, in no acute distress. HEENT: normal. Neck: Supple, no JVD, carotid bruits, or masses. Cardiac: bradycardia, RRR, no murmurs, rubs, or gallops. No clubbing, cyanosis, edema.  Radials/PT 2+ and equal bilaterally.  Respiratory:  Respirations regular and unlabored, clear  to auscultation bilaterally. GI: Soft, nontender, nondistended. MS: No deformity or atrophy. Skin: Warm and dry, no rash. Neuro:  Strength and sensation are intact. Psych: Normal affect.  Assessment & Plan    1. DOE/nonobstructive CAD - Lexiscan Myoview 04/13/20 low risk with no evidence of ischemia.  No chest pain, pressure, tightness.  Reports his dyspnea on exertion is stable at baseline.  EKG today without acute ST/T wave changes.  No indication for ischemia eval at this time. Continue GDMT beta blocker, statin. No aspirin secondary to chronic anticoagulation.  Continue regular cardiovascular exercise and low-sodium, heart  healthy diet.  2. PSVT/AVNRT -s/p catheter ablation 12/2019. No evidence of recurrence.  Continue metoprolol.  3. Paroxysmal atrial flutter -s/p catheter ablation 12/2019. Maintaining sinus rhythm.  4. Persistent atrial fibrillation -Noted in clinic 01/2020 with DCCV 02/2020.  CHA2DS2-VASc score of 4. Maintaining sinus bradycardia.  Continue metoprolol succinate 25 mg daily.  Continue Eliquis 5 mg twice daily.  Does not meet criteria for dose reduction.  Denies bleeding complications.  Hemoglobin 05/14/2020 stable.  5. HLD - Continue Rosuvastatin.  LDL goal less than 70.  Continue to follow with primary care -he has labs upcoming next month.  6. PVC - No recurrence on beta blocker therapy and magnesium.   Disposition: Follow up in 4 month(s) with Dr. Kirke Corin or APP   Alver Sorrow, NP 07/15/2020, 8:40 AM

## 2020-07-15 NOTE — Patient Instructions (Signed)
Medication Instructions:  No medication changes today.   *If you need a refill on your cardiac medications before your next appointment, please call your pharmacy*  Lab Work: No lab work today.   Testing/Procedures: Your EKG today showed sinus bradycardia which is a stable finding.   Follow-Up: At [facility], you and your health needs are our priority.  As part of our continuing mission to provide you with exceptional heart care, we have created designated Provider Care Teams.  These Care Teams include your primary Cardiologist (physician) and Advanced Practice Providers (APPs -  Physician Assistants and Nurse Practitioners) who all work together to provide you with the care you need, when you need it.  We recommend signing up for the patient portal called "MyChart".  Sign up information is provided on this After Visit Summary.  MyChart is used to connect with patients for Virtual Visits (Telemedicine).  Patients are able to view lab/test results, encounter notes, upcoming appointments, etc.  Non-urgent messages can be sent to your provider as well.   To learn more about what you can do with MyChart, go to [email or web address].    Your next appointment:   4 month(s)  The format for your next appointment:   In Person  Provider:   You may see [name], MD or one of the following Advanced Practice Providers on your designated Care Team:    Nicolasa Ducking, NP  Eula Listen, PA-C  Marisue Ivan, PA-C  Gillian Shields, NP  Other Instructions Fat and Cholesterol Restricted Eating Plan Getting too much fat and cholesterol in your diet may cause health problems. Choosing the right foods helps keep your fat and cholesterol at normal levels. This can keep you from getting certain diseases. What are tips for following this plan? Meal planning  At meals, divide your plate into four equal parts: ? Fill one-half of your plate with vegetables and green salads. ? Fill  one-fourth of your plate with whole grains. ? Fill one-fourth of your plate with low-fat (lean) protein foods.  Eat fish that is high in omega-3 fats at least two times a week. This includes mackerel, tuna, sardines, and salmon.  Eat foods that are high in fiber, such as whole grains, beans, apples, broccoli, carrots, peas, and barley. General tips   Work with your doctor to lose weight if you need to.  Avoid: ? Foods with added sugar. ? Fried foods. ? Foods with partially hydrogenated oils.  Limit alcohol intake to no more than 1 drink a day for nonpregnant women and 2 drinks a day for men. One drink equals 12 oz of beer, 5 oz of wine, or 1 oz of hard liquor. Reading food labels  Check food labels for: ? Trans fats. ? Partially hydrogenated oils. ? Saturated fat (g) in each serving. ? Cholesterol (mg) in each serving. ? Fiber (g) in each serving.  Choose foods with healthy fats, such as: ? Monounsaturated fats. ? Polyunsaturated fats. ? Omega-3 fats.  Choose grain products that have whole grains. Look for the word "whole" as the first word in the ingredient list. Cooking  Cook foods using low-fat methods. These include baking, boiling, grilling, and broiling.  Eat more home-cooked foods. Eat at restaurants and buffets less often.  Avoid cooking using saturated fats, such as butter, cream, palm oil, palm kernel oil, and coconut oil. Recommended foods  Fruits  All fresh, canned (in natural juice), or frozen fruits. Vegetables  Fresh or frozen vegetables (raw, steamed, roasted, or grilled).  Green salads. Grains  Whole grains, such as whole wheat or whole grain breads, crackers, cereals, and pasta. Unsweetened oatmeal, bulgur, barley, quinoa, or brown rice. Corn or whole wheat flour tortillas. Meats and other protein foods  Ground beef (85% or leaner), grass-fed beef, or beef trimmed of fat. Skinless chicken or [place]. Ground chicken or [place]. Pork trimmed of fat.  All fish and seafood. Egg whites. Dried beans, peas, or lentils. Unsalted nuts or seeds. Unsalted canned beans. Nut butters without added sugar or oil. Dairy  Low-fat or nonfat dairy products, such as skim or 1% milk, 2% or reduced-fat cheeses, low-fat and fat-free ricotta or cottage cheese, or plain low-fat and nonfat yogurt. Fats and oils  Tub margarine without trans fats. Light or reduced-fat mayonnaise and salad dressings. Avocado. Olive, canola, sesame, or safflower oils. The items listed above may not be a complete list of foods and beverages you can eat. Contact a dietitian for more information. Foods to avoid Fruits  Canned fruit in heavy syrup. Fruit in cream or butter sauce. Fried fruit. Vegetables  Vegetables cooked in cheese, cream, or butter sauce. Fried vegetables. Grains  White bread. White pasta. White rice. Cornbread. Bagels, pastries, and croissants. Crackers and snack foods that contain trans fat and hydrogenated oils. Meats and other protein foods  Fatty cuts of meat. Ribs, chicken wings, bacon, sausage, bologna, salami, chitterlings, fatback, hot dogs, bratwurst, and packaged lunch meats. Liver and organ meats. Whole eggs and egg yolks. Chicken and [place] with skin. Fried meat. Dairy  Whole or 2% milk, cream, half-and-half, and cream cheese. Whole milk cheeses. Whole-fat or sweetened yogurt. Full-fat cheeses. Nondairy creamers and whipped toppings. Processed cheese, cheese spreads, and cheese curds. Beverages  Alcohol. Sugar-sweetened drinks such as sodas, lemonade, and fruit drinks. Fats and oils  Butter, stick margarine, lard, shortening, ghee, or bacon fat. Coconut, palm kernel, and palm oils. Sweets and desserts  Corn syrup, sugars, honey, and molasses. Candy. Jam and jelly. Syrup. Sweetened cereals. Cookies, pies, cakes, donuts, muffins, and ice cream. The items listed above may not be a complete list of foods and beverages you should avoid. Contact a  dietitian for more information. Summary  Choosing the right foods helps keep your fat and cholesterol at normal levels. This can keep you from getting certain diseases.  At meals, fill one-half of your plate with vegetables and green salads.  Eat high-fiber foods, like whole grains, beans, apples, carrots, peas, and barley.  Limit added sugar, saturated fats, alcohol, and fried foods. This information is not intended to replace advice given to you by your health care provider. Make sure you discuss any questions you have with your health care provider. Document Revised: 07/10/2018 Document Reviewed: 07/24/2017 Elsevier Patient Education  2020 [employer].

## 2020-10-07 ENCOUNTER — Ambulatory Visit (INDEPENDENT_AMBULATORY_CARE_PROVIDER_SITE_OTHER): Payer: Medicare Other | Admitting: Urology

## 2020-10-07 ENCOUNTER — Other Ambulatory Visit: Payer: Self-pay

## 2020-10-07 ENCOUNTER — Encounter: Payer: Self-pay | Admitting: Urology

## 2020-10-07 VITALS — BP 108/73 | HR 61 | Ht 70.0 in | Wt 215.0 lb

## 2020-10-07 DIAGNOSIS — N401 Enlarged prostate with lower urinary tract symptoms: Secondary | ICD-10-CM

## 2020-10-07 DIAGNOSIS — R350 Frequency of micturition: Secondary | ICD-10-CM | POA: Diagnosis not present

## 2020-10-07 MED ORDER — TAMSULOSIN HCL 0.4 MG PO CAPS
0.8000 mg | ORAL_CAPSULE | Freq: Every day | ORAL | 0 refills | Status: DC
Start: 2020-10-07 — End: 2021-01-25

## 2020-10-07 MED ORDER — FINASTERIDE 5 MG PO TABS: 5.0000 mg | ORAL_TABLET | Freq: Every day | ORAL | 2 refills | Status: DC

## 2020-10-07 NOTE — Progress Notes (Signed)
10/07/2020 1:27 PM   Joycie Peek [name] Nov 01, 1941 [id number]  Referring provider: [name], MD [address] [facility] [place],  [place] [address]  Chief Complaint  Patient presents with  . Benign Prostatic Hypertrophy    Urologic history: 1.BPH with lower urinary tract symptoms -On dutasteride several years taking every other day -Tamsulosin added last year for increased urgency/nocturia  2.Elevated PSA -Prostate biopsy early 2000 PSA 6.1; benign -Rebiopsy 2006; PSA 6.6; benign -Started on dutasteride with baseline PSA low-mid 2 range. -Prostate cancer screening discontinued 2019   HPI: 79 y.o. male presents for annual follow-up.   No significant change in voiding pattern since his visit last year  Still having some urgency which is bothersome at times  IPSS today 9/35  Remains on finasteride/tamsulosin  Denies dysuria, gross hematuria  No flank, abdominal or pelvic pain  PCP checked PSA 10/21-1.82 (uncorrected)    PMH: Past Medical History:  Diagnosis Date  . Arthritis   . Dizziness and giddiness   . Dysrhythmia   . GERD (gastroesophageal reflux disease)   . Gout   . History of NICM    a. Prev EF as low as 35-40%;  b. 03/2012 Echo: EF 50-55%, Gr 1 DD, mild MR;  c. 06/2014 Echo: EF 50-55%, gr 1 DD, mild MR, mildly dil LA; d. 11/2019 Echo: EF 50-55%.  [name] History of systolic CHF    a. Prev EF as low as 35-40%;  b. 03/2012 Echo: EF 50-55%, Gr 1 DD, mild MR;  c. 06/2014 Echo: EF 50-55%, gr 1 DD; d. 11/2019 Echo: EF 50-55%, no rwma, mildly dil LA, nl RV fxn, triv MR/PR.  [name] Hypothyroidism   . Non-obstructive CAD    a. 2009 nonobs dzs, LAD 20, EF 40-45%;  b. 07/2012 Ex MV: EF 55%, freq pvc's, no ischemia/infarct.  [name] PAF (paroxysmal atrial fibrillation) (HCC)    a. 11/2013 - occurred 3 wks post-op Appe in setting of pelvic abscess-->amio-->converted but amio later d/c'd 2/2 hypothyroidism; b. 01/2020 recurrent rate-controlled  Afib-->Eliquis-->s/p DCCV 02/2020.  . Paroxysmal Atrial flutter (HCC)    a. 12/2019 s/p RFCA.  . Paroxysmal supraventricular tachycardia (HCC)    a. SVT/PAT  . Prostate enlargement   . PSVT (paroxysmal supraventricular tachycardia) (HCC)    a. 12/2019 s/p RFCA.  . PVC's (premature ventricular contractions)    a. Asymptomatic - managed with Toprol and Mg.  . Sciatica of right side     Surgical History: Past Surgical History:  Procedure Laterality Date  . ABSCESS DRAINAGE    . APPENDECTOMY  2015  . BACK SURGERY    . CARDIAC CATHETERIZATION  2009  . CARDIOVERSION N/A 03/15/2020   Procedure: CARDIOVERSION;  Surgeon: Iran Ouch, MD;  Location: [facility];  Service: Cardiovascular;  Laterality: N/A;  . CATARACT EXTRACTION Right   . CATARACT EXTRACTION W/PHACO Right 10/17/2017   Procedure: CATARACT EXTRACTION PHACO AND INTRAOCULAR LENS PLACEMENT (IOC) RIGHT;  Surgeon: [name], MD;  Location: [facility];  Service: Ophthalmology;  Laterality: Right;  . CATARACT EXTRACTION W/PHACO Left 12/24/2017   Procedure: CATARACT EXTRACTION PHACO AND INTRAOCULAR LENS PLACEMENT (IOC) LEFT;  Surgeon: [name], MD;  Location: [facility];  Service: Ophthalmology;  Laterality: Left;  . COLONOSCOPY    . COLONOSCOPY WITH PROPOFOL N/A 11/06/2018   Procedure: COLONOSCOPY WITH PROPOFOL;  Surgeon: Toledo, Boykin Nearing, MD;  Location: [facility];  Service: Gastroenterology;  Laterality: N/A;  . DUPUYTREN / PALMAR FASCIOTOMY Bilateral   . DUPUYTREN  CONTRACTURE RELEASE Left 11/28/2017   Procedure: DUPUYTREN CONTRACTURE RELEASE;  Surgeon: [name], MD;  Location: [facility];  Service: Orthopedics;  Laterality: Left;  left index and fourth finger  . HAND SURGERY  1999  . JOINT REPLACEMENT Right 2012   TKR  . PARTIAL KNEE ARTHROPLASTY Left   . SVT ABLATION N/A 12/29/2019   Procedure: SVT ABLATION;  Surgeon: Marinus Maw, MD;  Location: [facility] INVASIVE CV LAB;  Service:  Cardiovascular;  Laterality: N/A;  . TOTAL KNEE ARTHROPLASTY Right 06/17/2017    Home Medications:  Allergies as of 10/07/2020   No Known Allergies     Medication List       Accurate as of October 07, 2020  1:27 PM. If you have any questions, ask your nurse or doctor.        acetaminophen 500 MG tablet Commonly known as: TYLENOL Take 1,000 mg by mouth every 6 (six) hours as needed for moderate pain or headache.   amoxicillin 500 MG tablet Commonly known as: AMOXIL Take 2,000 mg by mouth See admin instructions. Take 4 capsules (2000 mg) by mouth 1 hour prior to dental procedures.   apixaban 5 MG Tabs tablet Commonly known as: Eliquis Take 1 tablet (5 mg total) by mouth 2 (two) times daily.   Biotin 5000 MCG Tabs Take 5,000 mcg by mouth daily.   diclofenac 75 MG EC tablet Commonly known as: VOLTAREN Take 75 mg by mouth 2 (two) times daily.   Fiber Therapy 500 MG Tabs Generic drug: Methylcellulose (Laxative) Take 500 mg by mouth 2 (two) times daily.   finasteride 5 MG tablet Commonly known as: PROSCAR Take 1 tablet (5 mg total) by mouth daily.   gabapentin 300 MG capsule Commonly known as: NEURONTIN Take 300 mg by mouth 3 (three) times daily.   HYDROcodone-acetaminophen 7.5-325 MG tablet Commonly known as: Norco Take 1 tablet by mouth every 6 (six) hours as needed for moderate pain.   levothyroxine 25 MCG tablet Commonly known as: SYNTHROID Take 25 mcg by mouth daily before breakfast.   LUBRICATING EYE DROPS OP Apply 1 drop to eye 2 (two) times daily as needed (dry eyes).   magnesium oxide 400 MG tablet Commonly known as: MAG-OX Take 400 mg by mouth 2 (two) times daily.   metoprolol succinate 25 MG 24 hr tablet Commonly known as: Toprol XL Take 1 tablet (25 mg total) by mouth daily.   pantoprazole 40 MG tablet Commonly known as: PROTONIX Take 40 mg by mouth daily.   rosuvastatin 5 MG tablet Commonly known as: CRESTOR Take 5 mg by mouth daily.     sodium chloride 0.65 % Soln nasal spray Commonly known as: OCEAN Place 1 spray into both nostrils at bedtime.   tamsulosin 0.4 MG Caps capsule Commonly known as: FLOMAX Take 1 capsule (0.4 mg total) by mouth daily.   Zinc 50 MG Tabs Take 50 mg by mouth daily.       Allergies: No Known Allergies  Family History: Family History  Problem Relation Age of Onset  . Heart attack Father 15  . Coronary artery disease Other        family hx  . Heart failure Other        family hx - CHF    Social History:  reports that he has never smoked. He has never used smokeless tobacco. He reports that he does not drink alcohol and does not use drugs.   Physical Exam: BP 108/73   Pulse  61   Ht 5\' 10"  (1.778 m)   Wt 215 lb (97.5 kg)   BMI 30.85 kg/m   Constitutional:  Alert and oriented, No acute distress. HEENT: [place] AT, moist mucus membranes.  Trachea midline, no masses. Cardiovascular: No clubbing, cyanosis, or edema. Respiratory: Normal respiratory effort, no increased work of breathing. Skin: No rashes, bruises or suspicious lesions. Neurologic: Grossly intact, no focal deficits, moving all 4 extremities. Psychiatric: Normal mood and affect.   Assessment & Plan:    1. Benign prostatic hyperplasia with LUTS  Moderate voiding symptoms, urgency more bothersome  Options discussed of increasing tamsulosin to 0.8 mg or adding a beta 3 agonist  He would prefer not to add a medication and will give a 1 month trial of tamsulosin titration  If effective he will call back for extended Rx  Bladder scan PVR 0 mL  Finasteride refilled   , MD  [facility] [address] [place], [place] [id number]

## 2020-10-08 ENCOUNTER — Ambulatory Visit: Payer: Medicare Other | Admitting: Urology

## 2020-10-08 LAB — MICROSCOPIC EXAMINATION: Bacteria, UA: NONE SEEN

## 2020-10-08 LAB — URINALYSIS, COMPLETE
Bilirubin, UA: NEGATIVE
Glucose, UA: NEGATIVE
Ketones, UA: NEGATIVE
Leukocytes,UA: NEGATIVE
Nitrite, UA: NEGATIVE
Protein,UA: NEGATIVE
Specific Gravity, UA: 1.02 (ref 1.005–1.030)
Urobilinogen, Ur: 0.2 mg/dL (ref 0.2–1.0)
pH, UA: 5.5 (ref 5.0–7.5)

## 2020-10-21 ENCOUNTER — Other Ambulatory Visit: Payer: Self-pay | Admitting: Family

## 2020-10-21 DIAGNOSIS — I480 Paroxysmal atrial fibrillation: Secondary | ICD-10-CM

## 2020-10-21 NOTE — Telephone Encounter (Signed)
Rx request sent to pharmacy.  

## 2020-10-23 ENCOUNTER — Other Ambulatory Visit: Payer: Self-pay | Admitting: Urology

## 2020-11-23 ENCOUNTER — Encounter: Payer: Self-pay | Admitting: Cardiovascular Disease

## 2020-11-23 ENCOUNTER — Ambulatory Visit (INDEPENDENT_AMBULATORY_CARE_PROVIDER_SITE_OTHER): Payer: Medicare Other | Admitting: Cardiovascular Disease

## 2020-11-23 ENCOUNTER — Other Ambulatory Visit: Payer: Self-pay

## 2020-11-23 VITALS — BP 110/68 | HR 54 | Ht 70.0 in | Wt 222.1 lb

## 2020-11-23 DIAGNOSIS — I4819 Other persistent atrial fibrillation: Secondary | ICD-10-CM | POA: Diagnosis not present

## 2020-11-23 DIAGNOSIS — I428 Other cardiomyopathies: Secondary | ICD-10-CM

## 2020-11-23 DIAGNOSIS — I471 Supraventricular tachycardia, unspecified: Secondary | ICD-10-CM

## 2020-11-23 DIAGNOSIS — I493 Ventricular premature depolarization: Secondary | ICD-10-CM | POA: Diagnosis not present

## 2020-11-23 NOTE — Patient Instructions (Signed)

## 2020-11-23 NOTE — Progress Notes (Signed)
Cardiology Office Note   Date:  11/23/2020   ID:  Vincent [name], DOB Apr 13, 1941, MRN [id number]  PCP:  Tracie Harrier, MD  Cardiologist:   Kathlyn Sacramento, MD   Chief Complaint  Patient presents with  . Follow-up    4 month. Meds reviewed by the patient verbally. "doing well."       History of Present Illness: Vincent [name] is a 80 y.o. male who presents for a follow up  regarding supraventricular tachycardia, mild cardiomyopathy, paroxysmal atrial fibrillation, atrial flutter and ventricular bigeminy.  Cardiac catheterization in 2009 showed an ejection fraction of 40-45% range with a mild nonobstructive 20% plaque in his LAD.   Most recent echocardiogram in January 2021 showed an EF of 50 to 55% with no significant valvular abnormalities. The patient had symptomatic SVT and atrial flutter status post ablation by Dr. Lovena Le in February 2021.  He was found to be in atrial fibrillation after ablation and was subsequently started on anticoagulation and underwent cardioversion in April with no recurrent arrhythmia since then.  He has been doing well with no recent chest pain, shortness of breath or palpitations.  He did undergo a nuclear stress test in May which showed no clear evidence of ischemia.   Past Medical History:  Diagnosis Date  . Arthritis   . Dizziness and giddiness   . Dysrhythmia   . GERD (gastroesophageal reflux disease)   . Gout   . History of NICM    a. Prev EF as low as 35-40%;  b. 03/2012 Echo: EF 50-55%, Gr 1 DD, mild MR;  c. 06/2014 Echo: EF 50-55%, gr 1 DD, mild MR, mildly dil LA; d. 11/2019 Echo: EF 50-55%.  [name] History of systolic CHF    a. Prev EF as low as 35-40%;  b. 03/2012 Echo: EF 50-55%, Gr 1 DD, mild MR;  c. 06/2014 Echo: EF 50-55%, gr 1 DD; d. 11/2019 Echo: EF 50-55%, no rwma, mildly dil LA, nl RV fxn, triv MR/PR.  [name] Hypothyroidism   . Non-obstructive CAD    a. 2009 nonobs dzs, LAD 20, EF 40-45%;  b. 07/2012 Ex MV: EF 55%, freq pvc's, no  ischemia/infarct.  [name] PAF (paroxysmal atrial fibrillation) ([place])    a. 11/2013 - occurred 3 wks post-op Appe in setting of pelvic abscess-->amio-->converted but amio later d/c'd 2/2 hypothyroidism; b. 01/2020 recurrent rate-controlled Afib-->Eliquis-->s/p DCCV 02/2020.  . Paroxysmal Atrial flutter ([place])    a. 12/2019 s/p RFCA.  . Paroxysmal supraventricular tachycardia (HCC)    a. SVT/PAT  . Prostate enlargement   . PSVT (paroxysmal supraventricular tachycardia) ([place])    a. 12/2019 s/p RFCA.  . PVC's (premature ventricular contractions)    a. Asymptomatic - managed with Toprol and Mg.  . Sciatica of right side     Past Surgical History:  Procedure Laterality Date  . ABSCESS DRAINAGE    . APPENDECTOMY  2015  . BACK SURGERY    . CARDIAC CATHETERIZATION  2009  . CARDIOVERSION N/A 03/15/2020   Procedure: CARDIOVERSION;  Surgeon: Wellington Hampshire, MD;  Location: [facility];  Service: Cardiovascular;  Laterality: N/A;  . CATARACT EXTRACTION Right   . CATARACT EXTRACTION W/PHACO Right 10/17/2017   Procedure: CATARACT EXTRACTION PHACO AND INTRAOCULAR LENS PLACEMENT ([place]) RIGHT;  Surgeon: Leandrew Koyanagi, MD;  Location: [facility];  Service: Ophthalmology;  Laterality: Right;  . CATARACT EXTRACTION W/PHACO Left 12/24/2017   Procedure: CATARACT EXTRACTION PHACO AND INTRAOCULAR LENS PLACEMENT ([place]) LEFT;  Surgeon: Leandrew Koyanagi,  MD;  Location: [facility];  Service: Ophthalmology;  Laterality: Left;  . COLONOSCOPY    . COLONOSCOPY WITH PROPOFOL N/A 11/06/2018   Procedure: COLONOSCOPY WITH PROPOFOL;  Surgeon: Toledo, Boykin Nearing, MD;  Location: [facility];  Service: Gastroenterology;  Laterality: N/A;  . DUPUYTREN / PALMAR FASCIOTOMY Bilateral   . 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 HAND SURGERY   1999  . JOINT REPLACEMENT Right 2012   TKR  . PARTIAL KNEE ARTHROPLASTY Left   . SVT ABLATION N/A 12/29/2019   Procedure: SVT ABLATION;  Surgeon: Marinus Maw, MD;  Location: [facility] INVASIVE CV LAB;  Service: Cardiovascular;  Laterality: N/A;  . TOTAL KNEE ARTHROPLASTY Right 06/17/2017     Current Outpatient Medications  Medication Sig Dispense Refill  . acetaminophen (TYLENOL) 500 MG tablet Take 1,000 mg by mouth every 6 (six) hours as needed for moderate pain or headache.     [name] amoxicillin (AMOXIL) 500 MG tablet Take 2,000 mg by mouth See admin instructions. Take 4 capsules (2000 mg) by mouth 1 hour prior to dental procedures.    [name] apixaban (ELIQUIS) 5 MG TABS tablet Take 1 tablet (5 mg total) by mouth 2 (two) times daily. 180 tablet 3  . Biotin 5000 MCG TABS Take 5,000 mcg by mouth daily.    . Carboxymethylcellul-Glycerin (LUBRICATING EYE DROPS OP) Apply 1 drop to eye 2 (two) times daily as needed (dry eyes).    [name] diclofenac (VOLTAREN) 75 MG EC tablet Take 75 mg by mouth 2 (two) times daily.    [name] FIBER THERAPY 500 MG TABS Take 500 mg by mouth 2 (two) times daily.    . finasteride (PROSCAR) 5 MG tablet Take 1 tablet (5 mg total) by mouth daily. 90 tablet 2  . gabapentin (NEURONTIN) 300 MG capsule Take 300 mg by mouth 3 (three) times daily.     [name] HYDROcodone-acetaminophen (NORCO) 7.5-325 MG tablet Take 1 tablet by mouth every 6 (six) hours as needed for moderate pain. 50 tablet 0  . levothyroxine (SYNTHROID, LEVOTHROID) 25 MCG tablet Take 25 mcg by mouth daily before breakfast.    . magnesium oxide (MAG-OX) 400 MG tablet Take 400 mg by mouth 2 (two) times daily.     . metoprolol succinate (TOPROL-XL) 25 MG 24 hr tablet TAKE 1 TABLET DAILY 90 tablet 1  . pantoprazole (PROTONIX) 40 MG tablet Take 40 mg by mouth daily.    . predniSONE (DELTASONE) 10 MG tablet Take 10 mg by mouth 2 (two) times daily.    . rosuvastatin (CRESTOR) 5 MG tablet Take 5 mg by mouth daily.     . sodium chloride (OCEAN)  0.65 % SOLN nasal spray Place 1 spray into both nostrils at bedtime.     . tamsulosin (FLOMAX) 0.4 MG CAPS capsule Take 2 capsules (0.8 mg total) by mouth daily. Brief trial double dose tamsulosin 30 capsule 0  . Zinc 50 MG TABS Take 50 mg by mouth daily.     No current facility-administered medications for this visit.    Allergies:   Patient has no known allergies.    Social History:  The patient  reports that he has never smoked. He has never used smokeless tobacco. He reports that he does not drink alcohol and does not use drugs.   Family History:  The patient's family  history includes Coronary artery disease in an other family member; Heart attack (age of onset: 8) in his father; Heart failure in an other family member.    ROS:  Please see the history of present illness.   Otherwise, review of systems are positive for none.   All other systems are reviewed and negative.    PHYSICAL EXAM: VS:  BP 110/68 (BP Location: Left Arm, Patient Position: Sitting, Cuff Size: Normal)   Pulse (!) 54   Ht 5\' 10"  (1.778 m)   Wt 222 lb 2 oz (100.8 kg)   SpO2 97%   BMI 31.87 kg/m  , BMI Body mass index is 31.87 kg/m. GEN: Well nourished, well developed, in no acute distress  HEENT: normal  Neck: no JVD, carotid bruits, or masses Cardiac: Irregularly irregular; no murmurs, rubs, or gallops,no edema  Respiratory:  clear to auscultation bilaterally, normal work of breathing GI: soft, nontender, nondistended, + BS MS: no deformity or atrophy  Skin: warm and dry, no rash Neuro:  Strength and sensation are intact Psych: euthymic mood, full affect   EKG:  EKG is ordered today. The ekg ordered today demonstrates sinus bradycardia with nonspecific ST changes.   Recent Labs: 03/04/2020: BUN 20; Creatinine, Ser 1.17; Potassium 4.5; Sodium 142 03/09/2020: Hemoglobin 12.8; Platelets 170    Lipid Panel    Component Value Date/Time   CHOL 218 10/25/2009 0000   TRIG 189 10/25/2009 0000   HDL  37.3 10/25/2009 0000   LDLCALC 142.9 10/25/2009 0000      Wt Readings from Last 3 Encounters:  11/23/20 222 lb 2 oz (100.8 kg)  10/07/20 215 lb (97.5 kg)  07/15/20 221 lb (100.2 kg)      No flowsheet data found.    ASSESSMENT AND PLAN:  1.  Paroxysmal supraventricular tachycardia and atrial flutter: Status post successful ablation.  Continue Toprol.  2. Frequent PVCs:  No evidence of significant PVCs on metoprolol.  3. History of nonischemic cardiomyopathy: Most recent echo showed an EF of 50 to 55% which is improved from before.  4.  Persistent atrial fibrillation: Status post successful cardioversion in April .  He is maintaining in sinus rhythm and tolerating anticoagulation with Eliquis which should be continued given chads vas score of 4.  5.  Hyperlipidemia: On small dose rosuvastatin with significant improvement in LDL.   Disposition: Follow-up in 6 months.  Signed,  May, MD  11/23/2020 5:53 PM    [facility]

## 2020-11-24 ENCOUNTER — Telehealth: Payer: Self-pay | Admitting: Pharmacist

## 2020-11-24 NOTE — Telephone Encounter (Signed)
Prior authorization has been submitted for Eliquis continuation of therapy.

## 2020-12-16 MED ORDER — APIXABAN 5 MG PO TABS: 5.0000 mg | ORAL_TABLET | Freq: Two times a day (BID) | ORAL | 1 refills | Status: DC

## 2020-12-16 NOTE — Addendum Note (Signed)
Addended by: Ruhan Borak E on: 12/16/2020 03:12 PM   Modules accepted: Orders

## 2020-12-16 NOTE — Telephone Encounter (Signed)
Appeals for Eliquis still pending. Spoke with pt, he still has a month of Eliquis left.  He has also been paying $125 for a 3 month supply. I have activated a copay card for him so that a 3 month supply will only cost $30. [facility] so that copay card can be used. I called info to pharmacy and also mailed card to pt so will have a copy.

## 2020-12-19 ENCOUNTER — Other Ambulatory Visit: Payer: Self-pay | Admitting: Urology

## 2020-12-25 ENCOUNTER — Other Ambulatory Visit: Payer: Self-pay | Admitting: Urology

## 2020-12-27 ENCOUNTER — Telehealth: Payer: Self-pay

## 2020-12-27 NOTE — Telephone Encounter (Signed)
Incoming call on triage line in regards to patients tamsulosin Rx. Patient would like to know if there is any other medication that he could try for his frequency and urgency besides tamsulosin. He would also like a refill on tamsulosin if there is nothing else available, [facility] [address]. Please advise.

## 2020-12-27 NOTE — Telephone Encounter (Signed)
Can give a trial of Myrbetriq.  He can pick up samples 25 mg x 4 weeks and call back regarding efficacy

## 2020-12-27 NOTE — Telephone Encounter (Signed)
Let patient know he can pick up medication at the front desk at our office.

## 2020-12-29 ENCOUNTER — Other Ambulatory Visit: Payer: Self-pay

## 2020-12-29 MED ORDER — FINASTERIDE 5 MG PO TABS: 5.0000 mg | ORAL_TABLET | Freq: Every day | ORAL | 0 refills | Status: DC

## 2020-12-29 MED ORDER — FINASTERIDE 5 MG PO TABS: 5.0000 mg | ORAL_TABLET | Freq: Every day | ORAL | 1 refills | Status: DC

## 2021-01-15 ENCOUNTER — Other Ambulatory Visit: Payer: Self-pay | Admitting: Urology

## 2021-01-17 ENCOUNTER — Telehealth: Payer: Self-pay | Admitting: Pharmacist

## 2021-01-17 NOTE — Telephone Encounter (Signed)
Called insurance for update on external appeals since this was submitted 7 weeks ago and we still have not heard back. They stated they upheld the denial for Eliquis and mailed this out to pt on 2/22.  Called pt to advise him that his copay card will continue to work at the pharmacy so that he can remain on Eliquis. He is aware to call with any issues filling his rx and was appreciative for the assistance.

## 2021-01-19 ENCOUNTER — Other Ambulatory Visit: Payer: Self-pay

## 2021-01-19 MED ORDER — FINASTERIDE 5 MG PO TABS: 5.0000 mg | ORAL_TABLET | Freq: Every day | ORAL | 2 refills | Status: DC

## 2021-01-19 NOTE — Telephone Encounter (Signed)
Patient called requesting refill on finasteride to mail order pharmacy this was sent. He also wanted to update Dr. Lonna Cobb on his results from his Myrbetriq trial. He states that it has helped with the urgency but his flow/stream is worse. He wants to know if this is something he should live with or is there another medication he can try for the urgency that may not effect his flow?

## 2021-01-20 MED ORDER — MIRABEGRON ER 25 MG PO TB24
25.0000 mg | ORAL_TABLET | Freq: Every day | ORAL | 3 refills | Status: DC
Start: 2021-01-20 — End: 2021-10-07

## 2021-01-20 NOTE — Telephone Encounter (Signed)
Per Dr. Lonna Cobb patient was notified that the Myrbetriq is the better option to help with the urgency but it can effect the flow. If this is something he can tolerate then he should continue with the Myrbetriq. Spoke with patient he is in agreement and would like a script sent in. Script sent to mail order pharmacy

## 2021-01-22 ENCOUNTER — Other Ambulatory Visit: Payer: Self-pay | Admitting: Urology

## 2021-01-24 ENCOUNTER — Telehealth: Payer: Self-pay

## 2021-01-24 NOTE — Telephone Encounter (Signed)
Prior Authorization for Eliquis 5 mg tablets approved.   PA# [employer] [id number] EF 01/24/2021-01/24/2022

## 2021-01-25 ENCOUNTER — Other Ambulatory Visit: Payer: Self-pay | Admitting: *Deleted

## 2021-01-25 MED ORDER — TAMSULOSIN HCL 0.4 MG PO CAPS: 0.4000 mg | ORAL_CAPSULE | Freq: Every day | ORAL | 3 refills | Status: DC

## 2021-01-25 NOTE — Telephone Encounter (Signed)
Myrbertric is working but to much money . He states he want to go back to Tamsulosin 1 tab daily.

## 2021-03-31 ENCOUNTER — Telehealth: Payer: Self-pay | Admitting: Cardiovascular Disease

## 2021-03-31 NOTE — Telephone Encounter (Signed)
   [facility] Pre-operative Risk Assessment    Patient Name: Vedant Shehadeh  DOB: 05-Sep-1941  MRN: [id number]   HEARTCARE STAFF: - Please ensure there is not already an duplicate clearance open for this procedure. - Under Visit Info/Reason for Call, type in Other and utilize the format Clearance MM/DD/YY or Clearance TBD. Do not use dashes or single digits. - If request is for dental extraction, please clarify the # of teeth to be extracted.  Request for surgical clearance:  1. What type of surgery is being performed? Colon / EGD  2. When is this surgery scheduled? 07/05/21  3. What type of clearance is required (medical clearance vs. Pharmacy clearance to hold med vs. Both)? both  4. Are there any medications that need to be held prior to surgery and how long? Eliquis instructions  5. Practice name and name of physician performing surgery? [facility]   6. What is the office phone number? [phone]   7.   What is the office fax number? [phone]  8.   Anesthesia type (None, local, MAC, general) ? General    Caryl Pina Gerringer 03/31/2021, 3:35 PM  _________________________________________________________________   (provider comments below)

## 2021-04-04 NOTE — Telephone Encounter (Signed)
Pt will return call this afternoon.

## 2021-04-04 NOTE — Telephone Encounter (Signed)
Follow Up:      Pt is returning a call from today. 

## 2021-04-04 NOTE — Telephone Encounter (Signed)
Patient with diagnosis of A Fib on Eliquis for anticoagulation.    1. Procedure: Colon / EGD  Date of procedure: 07/05/21   CHA2DS2-VASc Score = 4  his indicates a 4.8% annual risk of stroke. The patient's score is based upon: CHF History: Yes HTN History: No Diabetes History: No Stroke History: No Vascular Disease History: Yes Age Score: 2 Gender Score: 0    CrCl 49 mL/min using adjusted body weight Platelet count: not drawn in past year  Per office protocol, patient can hold Eliquis for 2 days prior to procedure.

## 2021-04-05 NOTE — Telephone Encounter (Signed)
   Primary Cardiologist: Vincent Bears, MD  Chart reviewed as part of pre-operative protocol coverage.   80 y.o. male with  Paroxysmal atrial fibrillation   PSVT, Atrial Flutter s/p RF ablation in 2/21  HFimpEF (heart failure with improved ejection fraction)   EF 40-45 improved to 50-55  Non-ischemic cardiomyopathy   Non-obstructive CAD (cath in 2009)  Last OV: 11/23/20, Dr. Kirke [name]  Procedure: Colo/EGD  RCRI:  Perioperative Risk of Major Cardiac Event is (%): 0.9 (low risk) DASI:  Functional Capacity in METs is: 4.86 (functional status is good )  Patient was contacted 04/05/2021 in reference to pre-operative risk assessment for pending surgery as outlined below.    Since last seen, Vincent [name] has done well without chest pain, shortness of breath.  Recommendations: . Therefore, based on ACC/AHA guidelines, the patient is at acceptable risk for the planned procedure without further cardiovascular testing.  . The patient may hold Apixaban (Eliquis) for 2 days prior to his procedure and resume post op when felt to be safe.  . The procedure is planned for August.  The patient knows to call us if he has any change in his CV status between now and his procedure.     Please call with questions. [name], PA-C 04/05/2021, 1:05 PM

## 2021-04-05 NOTE — Telephone Encounter (Signed)
Notes faxed to surgeon. This phone note will be removed from the preop pool. [name], PA-C  04/05/2021 1:09 PM

## 2021-04-05 NOTE — Telephone Encounter (Signed)
Patient was calling for pre opp has questions. Please advise

## 2021-05-08 ENCOUNTER — Other Ambulatory Visit: Payer: Self-pay | Admitting: Family

## 2021-05-08 DIAGNOSIS — I480 Paroxysmal atrial fibrillation: Secondary | ICD-10-CM

## 2021-05-09 NOTE — Telephone Encounter (Signed)
Attempted to schedule.  LMOV to call office.  ° °

## 2021-05-09 NOTE — Telephone Encounter (Signed)
Please schedule 6 month F/U appointment. Thank you! 

## 2021-05-11 NOTE — Telephone Encounter (Signed)
scheduled

## 2021-06-03 ENCOUNTER — Ambulatory Visit (INDEPENDENT_AMBULATORY_CARE_PROVIDER_SITE_OTHER): Payer: Medicare Other | Admitting: Cardiovascular Disease

## 2021-06-03 ENCOUNTER — Encounter: Payer: Self-pay | Admitting: Cardiovascular Disease

## 2021-06-03 ENCOUNTER — Other Ambulatory Visit: Payer: Self-pay

## 2021-06-03 VITALS — BP 108/70 | HR 51 | Ht 70.5 in | Wt 225.1 lb

## 2021-06-03 DIAGNOSIS — E785 Hyperlipidemia, unspecified: Secondary | ICD-10-CM | POA: Diagnosis not present

## 2021-06-03 DIAGNOSIS — I493 Ventricular premature depolarization: Secondary | ICD-10-CM

## 2021-06-03 DIAGNOSIS — I471 Supraventricular tachycardia, unspecified: Secondary | ICD-10-CM

## 2021-06-03 DIAGNOSIS — I4819 Other persistent atrial fibrillation: Secondary | ICD-10-CM

## 2021-06-03 NOTE — Patient Instructions (Signed)
Medication Instructions:  Your physician recommends that you continue on your current medications as directed. Please refer to the Current Medication list given to you today.  *If you need a refill on your cardiac medications before your next appointment, please call your pharmacy*   Lab Work: None ordered If you have labs (blood work) drawn today and your tests are completely normal, you will receive your results only by: MyChart Message (if you have MyChart) OR A paper copy in the mail If you have any lab test that is abnormal or we need to change your treatment, we will call you to review the results.   Testing/Procedures: None ordered   Follow-Up: At [facility], you and your health needs are our priority.  As part of our continuing mission to provide you with exceptional heart care, we have created designated Provider Care Teams.  These Care Teams include your primary Cardiologist (physician) and Advanced Practice Providers (APPs -  Physician Assistants and Nurse Practitioners) who all work together to provide you with the care you need, when you need it.  We recommend signing up for the patient portal called "MyChart".  Sign up information is provided on this After Visit Summary.  MyChart is used to connect with patients for Virtual Visits (Telemedicine).  Patients are able to view lab/test results, encounter notes, upcoming appointments, etc.  Non-urgent messages can be sent to your provider as well.   To learn more about what you can do with MyChart, go to [email or web address].    Your next appointment:   Your physician wants you to follow-up in: 6 months You will receive a reminder letter in the mail two months in advance. If you don't receive a letter, please call our office to schedule the follow-up appointment.   The format for your next appointment:   In Person  Provider:   You may see Muhammad Arida, MD or one of the following Advanced Practice Providers on your  designated Care Team:   Christopher Berge, NP Ryan Dunn, PA-C Jacquelyn Visser, PA-C Cadence Furth, PA-C   Other Instructions N/A  

## 2021-06-03 NOTE — Progress Notes (Signed)
Cardiology Office Note   Date:  06/03/2021   ID:  Vincent [name], DOB 1941-04-03, MRN [id number]  PCP:  [name], MD  Cardiologist:   [name], MD   Chief Complaint  Patient presents with   Other    6 month f/u no complaints today. Meds reviewed verbally with pt.      History of Present Illness: Vincent [name] is a 80 y.o. male who presents for a follow up  regarding supraventricular tachycardia, mild cardiomyopathy, paroxysmal atrial fibrillation, atrial flutter and ventricular bigeminy.  Cardiac catheterization in 2009 showed an ejection fraction of 40-45% range with a mild nonobstructive 20% plaque in his LAD.   Most recent echocardiogram in January 2021 showed an EF of 50 to 55% with no significant valvular abnormalities. The patient had symptomatic SVT and atrial flutter status post ablation by Dr. Ladona Ridgel in February 2021.  He was found to be in atrial fibrillation after ablation and was subsequently started on anticoagulation and underwent cardioversion in April with no recurrent arrhythmia since then.  Has been doing well with no chest pain, shortness of breath or palpitations.  No syncope or presyncope. Most recent ischemic cardiac evaluation in May 2021 included a [facility] which showed no evidence of ischemia with normal ejection fraction.  Past Medical History:  Diagnosis Date   Arthritis    Dizziness and giddiness    Dysrhythmia    GERD (gastroesophageal reflux disease)    Gout    History of NICM    a. Prev EF as low as 35-40%;  b. 03/2012 Echo: EF 50-55%, Gr 1 DD, mild MR;  c. 06/2014 Echo: EF 50-55%, gr 1 DD, mild MR, mildly dil LA; d. 11/2019 Echo: EF 50-55%.   History of systolic CHF    a. Prev EF as low as 35-40%;  b. 03/2012 Echo: EF 50-55%, Gr 1 DD, mild MR;  c. 06/2014 Echo: EF 50-55%, gr 1 DD; d. 11/2019 Echo: EF 50-55%, no rwma, mildly dil LA, nl RV fxn, triv MR/PR.   Hypothyroidism    Non-obstructive CAD    a. 2009 nonobs  dzs, LAD 20, EF 40-45%;  b. 07/2012 Ex MV: EF 55%, freq pvc's, no ischemia/infarct.   PAF (paroxysmal atrial fibrillation) (HCC)    a. 11/2013 - occurred 3 wks post-op Appe in setting of pelvic abscess-->amio-->converted but amio later d/c'd 2/2 hypothyroidism; b. 01/2020 recurrent rate-controlled Afib-->Eliquis-->s/p DCCV 02/2020.   Paroxysmal Atrial flutter (HCC)    a. 12/2019 s/p RFCA.   Paroxysmal supraventricular tachycardia (HCC)    a. SVT/PAT   Prostate enlargement    PSVT (paroxysmal supraventricular tachycardia) (HCC)    a. 12/2019 s/p RFCA.   PVC's (premature ventricular contractions)    a. Asymptomatic - managed with Toprol and Mg.   Sciatica of right side     Past Surgical History:  Procedure Laterality Date   ABSCESS DRAINAGE     APPENDECTOMY  2015   BACK SURGERY     CARDIAC CATHETERIZATION  2009   CARDIOVERSION N/A 03/15/2020   Procedure: CARDIOVERSION;  Surgeon: Iran Ouch, MD;  Location: [facility] ORS;  Service: Cardiovascular;  Laterality: N/A;   CATARACT EXTRACTION Right    CATARACT EXTRACTION W/PHACO Right 10/17/2017   Procedure: CATARACT EXTRACTION PHACO AND INTRAOCULAR LENS PLACEMENT (IOC) RIGHT;  Surgeon: [name], MD;  Location: [facility];  Service: Ophthalmology;  Laterality: Right;   CATARACT EXTRACTION W/PHACO Left 12/24/2017   Procedure: CATARACT EXTRACTION PHACO AND INTRAOCULAR LENS  PLACEMENT (IOC) LEFT;  Surgeon: [name], MD;  Location: [facility];  Service: Ophthalmology;  Laterality: Left;   COLONOSCOPY     COLONOSCOPY WITH PROPOFOL N/A 11/06/2018   Procedure: COLONOSCOPY WITH PROPOFOL;  Surgeon: Toledo, Boykin Nearing, MD;  Location: [facility] ENDOSCOPY;  Service: Gastroenterology;  Laterality: N/A;   DUPUYTREN / PALMAR FASCIOTOMY Bilateral    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   Procedure: DUPUYTREN CONTRACTURE RELEASE;  Surgeon: [name], MD;  Location: [facility] ORS;  Service: Orthopedics;  Laterality: Left;   left index and fourth finger   HAND SURGERY  1999   JOINT REPLACEMENT Right 2012   TKR   PARTIAL KNEE ARTHROPLASTY Left    SVT ABLATION N/A 12/29/2019   Procedure: SVT ABLATION;  Surgeon: Marinus Maw, MD;  Location: [facility];  Service: Cardiovascular;  Laterality: N/A;   TOTAL KNEE ARTHROPLASTY Right 06/17/2017     Current Outpatient Medications  Medication Sig Dispense Refill   acetaminophen (TYLENOL) 500 MG tablet Take 1,000 mg by mouth every 6 (six) hours as needed for moderate pain or headache.      amoxicillin (AMOXIL) 500 MG tablet Take 2,000 mg by mouth See admin instructions. Take 4 capsules (2000 mg) by mouth 1 hour prior to dental procedures.     apixaban (ELIQUIS) 5 MG TABS tablet Take 1 tablet (5 mg total) by mouth 2 (two) times daily. 180 tablet 1   Biotin 5000 MCG TABS Take 5,000 mcg by mouth daily.     Carboxymethylcellul-Glycerin (LUBRICATING EYE DROPS OP) Apply 1 drop to eye 2 (two) times daily as needed (dry eyes).     diclofenac (VOLTAREN) 75 MG EC tablet Take 75 mg by mouth 2 (two) times daily.     FIBER THERAPY 500 MG TABS Take 500 mg by mouth 2 (two) times daily.     finasteride (PROSCAR) 5 MG tablet Take 1 tablet (5 mg total) by mouth daily. 90 tablet 2   gabapentin (NEURONTIN) 300 MG capsule Take 300 mg by mouth 4 (four) times daily.     HYDROcodone-acetaminophen (NORCO) 7.5-325 MG tablet Take 1 tablet by mouth every 6 (six) hours as needed for moderate pain. 50 tablet 0   levothyroxine (SYNTHROID, LEVOTHROID) 25 MCG tablet Take 25 mcg by mouth daily before breakfast.     magnesium oxide (MAG-OX) 400 MG tablet Take 400 mg by mouth 2 (two) times daily.      metoprolol succinate (TOPROL-XL) 25 MG 24 hr tablet Take 1 tablet (25 mg total) by mouth daily. Please call to schedule office visit for further refills. Thank you! 90 tablet 0   mirabegron ER (MYRBETRIQ) 25 MG TB24 tablet Take 1 tablet (25 mg total) by mouth daily. 90 tablet 3   pantoprazole  (PROTONIX) 40 MG tablet Take 40 mg by mouth daily.     rosuvastatin (CRESTOR) 5 MG tablet Take 5 mg by mouth daily.      sodium chloride (OCEAN) 0.65 % SOLN nasal spray Place 1 spray into both nostrils at bedtime.      tamsulosin (FLOMAX) 0.4 MG CAPS capsule Take 1 capsule (0.4 mg total) by mouth daily. 90 capsule 3   Zinc 50 MG TABS Take 50 mg by mouth daily.     predniSONE (DELTASONE) 10 MG tablet Take 10 mg by mouth 2 (two) times daily. (Patient not taking: Reported on 06/03/2021)     No current facility-administered medications for this visit.    Allergies:   Patient has no  known allergies.    Social History:  The patient  reports that he has never smoked. He has never used smokeless tobacco. He reports that he does not drink alcohol and does not use drugs.   Family History:  The patient's family history includes Coronary artery disease in an other family member; Heart attack (age of onset: 72) in his father; Heart failure in an other family member.    ROS:  Please see the history of present illness.   Otherwise, review of systems are positive for none.   All other systems are reviewed and negative.    PHYSICAL EXAM: VS:  BP 108/70 (BP Location: Left Arm, Patient Position: Sitting, Cuff Size: Normal)   Pulse (!) 51   Ht 5' 10.5" (1.791 m)   Wt 225 lb 2 oz (102.1 kg)   SpO2 96%   BMI 31.85 kg/m  , BMI Body mass index is 31.85 kg/m. GEN: Well nourished, well developed, in no acute distress  HEENT: normal  Neck: no JVD, carotid bruits, or masses Cardiac: Irregularly irregular; no murmurs, rubs, or gallops,no edema  Respiratory:  clear to auscultation bilaterally, normal work of breathing GI: soft, nontender, nondistended, + BS MS: no deformity or atrophy  Skin: warm and dry, no rash Neuro:  Strength and sensation are intact Psych: euthymic mood, full affect   EKG:  EKG is ordered today. The ekg ordered today demonstrates sinus bradycardia with nonspecific ST  changes.   Recent Labs: No results found for requested labs within last 8760 hours.    Lipid Panel    Component Value Date/Time   CHOL 218 10/25/2009 0000   TRIG 189 10/25/2009 0000   HDL 37.3 10/25/2009 0000   LDLCALC 142.9 10/25/2009 0000      Wt Readings from Last 3 Encounters:  06/03/21 225 lb 2 oz (102.1 kg)  11/23/20 222 lb 2 oz (100.8 kg)  10/07/20 215 lb (97.5 kg)      No flowsheet data found.    ASSESSMENT AND PLAN:  1.  Paroxysmal supraventricular tachycardia and atrial flutter: Status post successful ablation.  Continue Toprol.  2. Frequent PVCs:  No evidence of significant PVCs on metoprolol.   3. History of nonischemic cardiomyopathy: Most recent echo showed an EF of 50 to 55% which is improved from before.  4.  Persistent atrial fibrillation:  He is maintaining in sinus rhythm and tolerating anticoagulation with Eliquis which should be continued given chads vas score of 4.  5.  Hyperlipidemia: On small dose rosuvastatin with significant improvement in LDL.  Most recent lipid profile showed an LDL of 82.   Disposition: Follow-up in 6 months.  Signed,  [name], MD  06/03/2021 8:18 AM    [facility]

## 2021-06-09 NOTE — Addendum Note (Signed)
Addended by: Kendrick Fries on: 06/09/2021 09:16 AM   Modules accepted: Orders

## 2021-07-05 ENCOUNTER — Ambulatory Visit: Payer: Medicare Other | Admitting: Certified Registered"

## 2021-07-05 ENCOUNTER — Encounter
Admission: RE | Disposition: A | Discharge status: Home / Self Care | Payer: Self-pay | Source: Home / Self Care | Attending: Gastroenterology

## 2021-07-05 ENCOUNTER — Other Ambulatory Visit: Payer: Self-pay

## 2021-07-05 ENCOUNTER — Encounter: Payer: Self-pay | Admitting: *Deleted

## 2021-07-05 ENCOUNTER — Ambulatory Visit
Admission: RE | Admit: 2021-07-05 | Discharge: 2021-07-05 | Disposition: A | Discharge status: Home / Self Care | Payer: Medicare Other | Attending: Gastroenterology | Admitting: Gastroenterology

## 2021-07-05 DIAGNOSIS — Z79899 Other long term (current) drug therapy: Secondary | ICD-10-CM | POA: Diagnosis not present

## 2021-07-05 DIAGNOSIS — K2100 Gastro-esophageal reflux disease with esophagitis, without bleeding: Secondary | ICD-10-CM | POA: Diagnosis not present

## 2021-07-05 DIAGNOSIS — Z79890 Hormone replacement therapy (postmenopausal): Secondary | ICD-10-CM | POA: Diagnosis not present

## 2021-07-05 DIAGNOSIS — K449 Diaphragmatic hernia without obstruction or gangrene: Secondary | ICD-10-CM | POA: Insufficient documentation

## 2021-07-05 DIAGNOSIS — K5730 Diverticulosis of large intestine without perforation or abscess without bleeding: Secondary | ICD-10-CM | POA: Diagnosis not present

## 2021-07-05 DIAGNOSIS — K2950 Unspecified chronic gastritis without bleeding: Secondary | ICD-10-CM | POA: Insufficient documentation

## 2021-07-05 DIAGNOSIS — K529 Noninfective gastroenteritis and colitis, unspecified: Secondary | ICD-10-CM | POA: Insufficient documentation

## 2021-07-05 DIAGNOSIS — R0989 Other specified symptoms and signs involving the circulatory and respiratory systems: Secondary | ICD-10-CM | POA: Insufficient documentation

## 2021-07-05 DIAGNOSIS — Z791 Long term (current) use of non-steroidal anti-inflammatories (NSAID): Secondary | ICD-10-CM | POA: Diagnosis not present

## 2021-07-05 DIAGNOSIS — Z7901 Long term (current) use of anticoagulants: Secondary | ICD-10-CM | POA: Diagnosis not present

## 2021-07-05 DIAGNOSIS — K641 Second degree hemorrhoids: Secondary | ICD-10-CM | POA: Diagnosis not present

## 2021-07-05 HISTORY — PX: ESOPHAGOGASTRODUODENOSCOPY (EGD) WITH PROPOFOL: SHX5813

## 2021-07-05 HISTORY — DX: Disorder of kidney and ureter, unspecified: N28.9

## 2021-07-05 HISTORY — PX: COLONOSCOPY WITH PROPOFOL: SHX5780

## 2021-07-05 HISTORY — DX: Personal history of urinary calculi: Z87.442

## 2021-07-05 SURGERY — COLONOSCOPY WITH PROPOFOL
Anesthesia: General

## 2021-07-05 MED ORDER — PROPOFOL 500 MG/50ML IV EMUL
INTRAVENOUS | Status: DC | PRN
  Administered 2021-07-05: 120 ug/kg/min via INTRAVENOUS

## 2021-07-05 MED ORDER — GLYCOPYRROLATE 0.2 MG/ML IJ SOLN: INTRAMUSCULAR | Status: DC | PRN

## 2021-07-05 MED ORDER — LIDOCAINE 2% (20 MG/ML) 5 ML SYRINGE
INTRAMUSCULAR | Status: DC | PRN
  Administered 2021-07-05: 20 mg via INTRAVENOUS

## 2021-07-05 MED ORDER — PROPOFOL 10 MG/ML IV BOLUS
INTRAVENOUS | Status: DC | PRN
  Administered 2021-07-05: 30 mg via INTRAVENOUS
  Administered 2021-07-05: 70 mg via INTRAVENOUS

## 2021-07-05 MED ORDER — SODIUM CHLORIDE 0.9 % IV SOLN: INTRAVENOUS | Status: DC

## 2021-07-05 NOTE — Op Note (Signed)
[facility] Gastroenterology Patient Name: Vincent [name] Procedure Date: 07/05/2021 9:59 AM MRN: [id number] Account #: [id number] Date of Birth: Jul 21, 1941 Admit Type: Outpatient Age: 80 Room: [facility] ENDO ROOM 1 Gender: Male Note Status: Finalized Procedure:             Upper GI endoscopy Indications:           Globus sensation Providers:             Andrey Farmer MD, MD Referring MD:          Mertie Clause. Fletcher Anon, MD (Referring MD) Medicines:             Monitored Anesthesia Care Complications:         No immediate complications. Estimated blood loss:                         Minimal. Procedure:             Pre-Anesthesia Assessment:                        - Prior to the procedure, a History and Physical was                         performed, and patient medications and allergies were                         reviewed. The patient is competent. The risks and                         benefits of the procedure and the sedation options and                         risks were discussed with the patient. All questions                         were answered and informed consent was obtained.                         Patient identification and proposed procedure were                         verified by the physician, the nurse, the anesthetist                         and the technician in the endoscopy suite. Mental                         Status Examination: alert and oriented. Airway                         Examination: normal oropharyngeal airway and neck                         mobility. Respiratory Examination: clear to                         auscultation. CV Examination: normal. Prophylactic  Antibiotics: The patient does not require prophylactic                         antibiotics. Prior Anticoagulants: The patient has                         taken Eliquis (apixaban), last dose was 3 days prior                         to procedure. ASA Grade  Assessment: II - A patient                         with mild systemic disease. After reviewing the risks                         and benefits, the patient was deemed in satisfactory                         condition to undergo the procedure. The anesthesia                         plan was to use monitored anesthesia care (MAC).                         Immediately prior to administration of medications,                         the patient was re-assessed for adequacy to receive                         sedatives. The heart rate, respiratory rate, oxygen                         saturations, blood pressure, adequacy of pulmonary                         ventilation, and response to care were monitored                         throughout the procedure. The physical status of the                         patient was re-assessed after the procedure.                        After obtaining informed consent, the endoscope was                         passed under direct vision. Throughout the procedure,                         the patient's blood pressure, pulse, and oxygen                         saturations were monitored continuously. The Endoscope                         was introduced through the mouth, and advanced  to the                         second part of duodenum. The upper GI endoscopy was                         accomplished without difficulty. The patient tolerated                         the procedure well. Findings:      A small hiatal hernia was present.      Normal mucosa was found in the entire esophagus. Biopsies were obtained       from the proximal and distal esophagus with cold forceps for histology       of suspected eosinophilic esophagitis. Estimated blood loss was minimal.      Patchy mild inflammation characterized by erythema was found in the       gastric antrum. Biopsies were taken with a cold forceps for Helicobacter       pylori testing. Estimated blood loss was  minimal.      The examined duodenum was normal. Impression:            - Small hiatal hernia.                        - Normal mucosa was found in the entire esophagus.                         Biopsied.                        - Gastritis. Biopsied.                        - Normal examined duodenum. Recommendation:        - Perform a colonoscopy today. Procedure Code(s):     --- Professional ---                        [phone], Esophagogastroduodenoscopy, flexible,                         transoral; with biopsy, single or multiple Diagnosis Code(s):     --- Professional ---                        K44.9, Diaphragmatic hernia without obstruction or                         gangrene                        K29.70, Gastritis, unspecified, without bleeding                        F45.8, Other somatoform disorders CPT copyright 2019 American Medical Association. All rights reserved. The codes documented in this report are preliminary and upon coder review may  be revised to meet current compliance requirements. Andrey Farmer MD, MD 07/05/2021 10:42:37 AM Number of Addenda: 0 Note Initiated On: 07/05/2021 9:59 AM Estimated Blood Loss:  Estimated blood loss was minimal.      [facility]

## 2021-07-05 NOTE — Anesthesia Postprocedure Evaluation (Signed)
Anesthesia Post Note  Patient: Vincent [name]  Procedure(s) Performed: COLONOSCOPY WITH PROPOFOL ESOPHAGOGASTRODUODENOSCOPY (EGD) WITH PROPOFOL  Patient location during evaluation: Endoscopy Anesthesia Type: General Level of consciousness: awake and alert Pain management: pain level controlled Vital Signs Assessment: post-procedure vital signs reviewed and stable Respiratory status: spontaneous breathing, nonlabored ventilation and respiratory function stable Cardiovascular status: blood pressure returned to baseline and stable Postop Assessment: no apparent nausea or vomiting Anesthetic complications: no   No notable events documented.   Last Vitals:  Vitals:   07/05/21 0934 07/05/21 1042  BP: 139/87 91/69  Pulse: 79 (!) 56  Resp: 18 14  Temp: 37.1 C   SpO2: 99% 97%    Last Pain:  Vitals:   07/05/21 1042  TempSrc:   PainSc: 0-No pain                 Foye Deer

## 2021-07-05 NOTE — Transfer of Care (Signed)
Immediate Anesthesia Transfer of Care Note  Patient: Vincent [name]  Procedure(s) Performed: COLONOSCOPY WITH PROPOFOL ESOPHAGOGASTRODUODENOSCOPY (EGD) WITH PROPOFOL  Patient Location: Endoscopy Unit  Anesthesia Type:General  Level of Consciousness: awake  Airway & Oxygen Therapy: Patient Spontanous Breathing  Post-op Assessment: Report given to RN and Post -op Vital signs reviewed and stable  Post vital signs: Reviewed  Last Vitals:  Vitals Value Taken Time  BP 91/69 07/05/21 1042  Temp    Pulse 59 07/05/21 1043  Resp 17 07/05/21 1043  SpO2 97 % 07/05/21 1043  Vitals shown include unvalidated device data.  Last Pain:  Vitals:   07/05/21 1042  TempSrc:   PainSc: 0-No pain         Complications: No notable events documented.

## 2021-07-05 NOTE — H&P (Signed)
Outpatient short stay form Pre-procedure 07/05/2021  Vincent Bill, MD  Primary Physician: [name], MD  Reason for visit:  Dr. Marcello Fennel  History of present illness:   80 y/o gentleman with history of chronic diarrhea, 1-2 times a week. History of appendectomy. No family history of GI malignancies. Had colonoscopy in 2020 with normal biopsies. Last took eliquis 3 days ago. Also with globus sensation.    Current Facility-Administered Medications:    0.9 %  sodium chloride infusion, , Intravenous, Continuous, Haywood Meinders, Rossie Muskrat, MD, Last Rate: 20 mL/hr at 07/05/21 0950, New Bag at 07/05/21 0950  Medications Prior to Admission  Medication Sig Dispense Refill Last Dose   acetaminophen (TYLENOL) 500 MG tablet Take 1,000 mg by mouth every 6 (six) hours as needed for moderate pain or headache.    07/04/2021 at 0800   amoxicillin (AMOXIL) 500 MG tablet Take 2,000 mg by mouth See admin instructions. Take 4 capsules (2000 mg) by mouth 1 hour prior to dental procedures.   07/04/2021 at 0800   apixaban (ELIQUIS) 5 MG TABS tablet Take 1 tablet (5 mg total) by mouth 2 (two) times daily. 180 tablet 1 07/02/2021   Biotin 5000 MCG TABS Take 5,000 mcg by mouth daily.   Past Week   Carboxymethylcellul-Glycerin (LUBRICATING EYE DROPS OP) Apply 1 drop to eye 2 (two) times daily as needed (dry eyes).   07/04/2021   cetirizine (ZYRTEC) 10 MG tablet Take 10 mg by mouth daily.   07/04/2021   diclofenac (VOLTAREN) 75 MG EC tablet Take 75 mg by mouth 2 (two) times daily.   07/04/2021   FIBER THERAPY 500 MG TABS Take 500 mg by mouth 2 (two) times daily.   07/04/2021   finasteride (PROSCAR) 5 MG tablet Take 1 tablet (5 mg total) by mouth daily. 90 tablet 2 07/04/2021   gabapentin (NEURONTIN) 300 MG capsule Take 300 mg by mouth 4 (four) times daily.   07/04/2021   HYDROcodone-acetaminophen (NORCO) 7.5-325 MG tablet Take 1 tablet by mouth every 6 (six) hours as needed for moderate pain. 50 tablet 0 07/04/2021    hyoscyamine (ANASPAZ) 0.125 MG TBDP disintergrating tablet Place 0.125 mg under the tongue every 6 (six) hours as needed.   07/04/2021   levothyroxine (SYNTHROID, LEVOTHROID) 25 MCG tablet Take 25 mcg by mouth daily before breakfast.   07/04/2021   lipase/protease/amylase (CREON) 12000-38000 units CPEP capsule Take by mouth 3 (three) times daily with meals.   07/04/2021   magnesium oxide (MAG-OX) 400 MG tablet Take 400 mg by mouth 2 (two) times daily.    Past Week   metoprolol succinate (TOPROL-XL) 25 MG 24 hr tablet Take 1 tablet (25 mg total) by mouth daily. Please call to schedule office visit for further refills. Thank you! 90 tablet 0 07/05/2021 at 0700   mirabegron ER (MYRBETRIQ) 25 MG TB24 tablet Take 1 tablet (25 mg total) by mouth daily. 90 tablet 3 07/04/2021   Multiple Vitamin (MULTIVITAMIN) tablet Take 1 tablet by mouth daily.   Past Week   pantoprazole (PROTONIX) 40 MG tablet Take 40 mg by mouth daily.   07/04/2021   sodium chloride (OCEAN) 0.65 % SOLN nasal spray Place 1 spray into both nostrils at bedtime.    07/04/2021   tamsulosin (FLOMAX) 0.4 MG CAPS capsule Take 1 capsule (0.4 mg total) by mouth daily. 90 capsule 3 07/04/2021   Zinc 50 MG TABS Take 50 mg by mouth daily.   Past Week   predniSONE (DELTASONE) 10 MG  tablet Take 10 mg by mouth 2 (two) times daily. (Patient not taking: No sig reported)   Completed Course   rosuvastatin (CRESTOR) 5 MG tablet Take 5 mg by mouth daily.         No Known Allergies   Past Medical History:  Diagnosis Date   Arthritis    Dizziness and giddiness    Dysrhythmia    GERD (gastroesophageal reflux disease)    Gout    History of kidney stones    History of NICM    a. Prev EF as low as 35-40%;  b. 03/2012 Echo: EF 50-55%, Gr 1 DD, mild MR;  c. 06/2014 Echo: EF 50-55%, gr 1 DD, mild MR, mildly dil LA; d. 11/2019 Echo: EF 50-55%.   History of systolic CHF    a. Prev EF as low as 35-40%;  b. 03/2012 Echo: EF 50-55%, Gr 1 DD, mild MR;  c. 06/2014 Echo: EF  50-55%, gr 1 DD; d. 11/2019 Echo: EF 50-55%, no rwma, mildly dil LA, nl RV fxn, triv MR/PR.   Hypothyroidism    Non-obstructive CAD    a. 2009 nonobs dzs, LAD 20, EF 40-45%;  b. 07/2012 Ex MV: EF 55%, freq pvc's, no ischemia/infarct.   PAF (paroxysmal atrial fibrillation) (HCC)    a. 11/2013 - occurred 3 wks post-op Appe in setting of pelvic abscess-->amio-->converted but amio later d/c'd 2/2 hypothyroidism; b. 01/2020 recurrent rate-controlled Afib-->Eliquis-->s/p DCCV 02/2020.   Paroxysmal Atrial flutter (HCC)    a. 12/2019 s/p RFCA.   Paroxysmal supraventricular tachycardia (HCC)    a. SVT/PAT   Prostate enlargement    PSVT (paroxysmal supraventricular tachycardia) (HCC)    a. 12/2019 s/p RFCA.   PVC's (premature ventricular contractions)    a. Asymptomatic - managed with Toprol and Mg.   Renal insufficiency    Sciatica of right side     Review of systems:  Otherwise negative.    Physical Exam  Gen: Alert, oriented. Appears stated age.  HEENT: PERRLA. Lungs: No respiratory distress CV: RRR Abd: soft, benign, no masses Ext: No edema    Planned procedures: Proceed with colonoscopy. The patient understands the nature of the planned procedure, indications, risks, alternatives and potential complications including but not limited to bleeding, infection, perforation, damage to internal organs and possible oversedation/side effects from anesthesia. The patient agrees and gives consent to proceed.  Please refer to procedure notes for findings, recommendations and patient disposition/instructions.     Vincent Bill, MD [facility] Gastroenterology

## 2021-07-05 NOTE — Op Note (Signed)
[facility] Gastroenterology Patient Name: Vincent [name] Procedure Date: 07/05/2021 9:58 AM MRN: [id number] Account #: [id number] Date of Birth: [birth year] Admit Type: Outpatient Age: 80 Room: [facility] ENDO ROOM 1 Gender: Male Note Status: Finalized Procedure:             Colonoscopy Indications:           Chronic diarrhea Providers:             Andrey Farmer MD, MD Referring MD:          Mertie Clause. Fletcher Anon, MD (Referring MD) Medicines:             Monitored Anesthesia Care Complications:         No immediate complications. Estimated blood loss:                         Minimal. Procedure:             Pre-Anesthesia Assessment:                        - Prior to the procedure, a History and Physical was                         performed, and patient medications and allergies were                         reviewed. The patient is competent. The risks and                         benefits of the procedure and the sedation options and                         risks were discussed with the patient. All questions                         were answered and informed consent was obtained.                         Patient identification and proposed procedure were                         verified by the physician, the nurse, the anesthetist                         and the technician in the endoscopy suite. Mental                         Status Examination: alert and oriented. Airway                         Examination: normal oropharyngeal airway and neck                         mobility. Respiratory Examination: clear to                         auscultation. CV Examination: normal. Prophylactic                         Antibiotics: The  patient does not require prophylactic                         antibiotics. Prior Anticoagulants: The patient has                         taken Eliquis (apixaban), last dose was 3 days prior                         to procedure. ASA Grade Assessment: II  - A patient                         with mild systemic disease. After reviewing the risks                         and benefits, the patient was deemed in satisfactory                         condition to undergo the procedure. The anesthesia                         plan was to use monitored anesthesia care (MAC).                         Immediately prior to administration of medications,                         the patient was re-assessed for adequacy to receive                         sedatives. The heart rate, respiratory rate, oxygen                         saturations, blood pressure, adequacy of pulmonary                         ventilation, and response to care were monitored                         throughout the procedure. The physical status of the                         patient was re-assessed after the procedure.                        After obtaining informed consent, the colonoscope was                         passed under direct vision. Throughout the procedure,                         the patient's blood pressure, pulse, and oxygen                         saturations were monitored continuously. The                         Colonoscope was introduced through the anus and  advanced to the the terminal ileum. The colonoscopy                         was somewhat difficult due to significant looping.                         Successful completion of the procedure was aided by                         applying abdominal pressure. The patient tolerated the                         procedure well. The quality of the bowel preparation                         was good except the cecum was fair. Findings:      The perianal and digital rectal examinations were normal.      The terminal ileum appeared normal.      A 3 mm polyp was found in the ascending colon. The polyp was sessile.       The polyp was removed with a cold snare. Resection and retrieval were        complete. Biopsies for histology were taken with a cold forceps for       evaluation of celiac disease. Estimated blood loss was minimal.      Normal mucosa was found in the entire colon. Biopsies for histology were       taken with a cold forceps from the entire colon for evaluation of       microscopic colitis. Estimated blood loss was minimal.      Scattered small-mouthed diverticula were found in the sigmoid colon and       descending colon.      Internal hemorrhoids were found during retroflexion. The hemorrhoids       were Grade II (internal hemorrhoids that prolapse but reduce       spontaneously).      The exam was otherwise without abnormality on direct and retroflexion       views. Impression:            - The examined portion of the ileum was normal.                        - One 3 mm polyp in the ascending colon, removed with                         a cold snare. Resected and retrieved. Biopsied.                        - Normal mucosa in the entire examined colon. Biopsied.                        - Diverticulosis in the sigmoid colon and in the                         descending colon.                        - Internal hemorrhoids.                        -  The examination was otherwise normal on direct and                         retroflexion views. Recommendation:        - Discharge patient to home.                        - Resume previous diet.                        - Resume Eliquis (apixaban) at prior dose today.                        - Await pathology results.                        - No repeat colonoscopy due to current age (61 years                         or older). Last colonoscopy was two years ago with                         adequate prep and recommended no further screening                         colonoscopies. Fair prep in cecum on this exam.                        - Return to referring physician as previously                         scheduled. Procedure  Code(s):     --- Professional ---                        [phone], Colonoscopy, flexible; with removal of                         tumor(s), polyp(s), or other lesion(s) by snare                         technique                        45380, 30, Colonoscopy, flexible; with biopsy, single                         or multiple Diagnosis Code(s):     --- Professional ---                        K64.1, Second degree hemorrhoids                        K63.5, Polyp of colon                        K52.9, Noninfective gastroenteritis and colitis,                         unspecified  K57.30, Diverticulosis of large intestine without                         perforation or abscess without bleeding CPT copyright 2019 American Medical Association. All rights reserved. The codes documented in this report are preliminary and upon coder review may  be revised to meet current compliance requirements. Andrey Farmer MD, MD 07/05/2021 10:46:42 AM Number of Addenda: 0 Note Initiated On: 07/05/2021 9:58 AM Scope Withdrawal Time: 0 hours 11 minutes 25 seconds  Total Procedure Duration: 0 hours 18 minutes 22 seconds  Estimated Blood Loss:  Estimated blood loss was minimal.      [facility]

## 2021-07-05 NOTE — Anesthesia Preprocedure Evaluation (Signed)
Anesthesia Evaluation  Patient identified by MRN, date of birth, ID band Patient awake    Reviewed: Allergy & Precautions, NPO status , Patient's Chart, lab work & pertinent test results  History of Anesthesia Complications Negative for: history of anesthetic complications  Airway Mallampati: II  TM Distance: >3 FB Neck ROM: Full    Dental no notable dental hx.    Pulmonary sleep apnea and Continuous Positive Airway Pressure Ventilation , neg COPD,    breath sounds clear to auscultation- rhonchi (-) wheezing      Cardiovascular Exercise Tolerance: Poor + CAD (non-obstructive) and +CHF (HFrEF)  (-) Past MI, (-) Cardiac Stents and (-) CABG + dysrhythmias (s/p cardioversion 2021) Atrial Fibrillation  Rhythm:Regular Rate:Normal - Systolic murmurs and - Diastolic murmurs Echo 11/2019 1. Left ventricular ejection fraction, by visual estimation, is 50 to  55%. The left ventricle has low normal function. There is no left  ventricular hypertrophy.  2. Mildly dilated left ventricular internal cavity size.  3. The left ventricle has no regional wall motion abnormalities.  4. Global right ventricle has normal systolic function.The right  ventricular size is mildly enlarged. No increase in right ventricular wall  thickness.  5. Left atrial size was mildly dilated.  6. Right atrial size was normal.  7. The mitral valve is normal in structure. Trivial mitral valve  regurgitation.  8. The tricuspid valve is normal in structure.  9. The tricuspid valve is normal in structure. Tricuspid valve  regurgitation is not demonstrated.  10. The aortic valve is normal in structure. Aortic valve regurgitation is  not visualized.  11. The pulmonic valve was normal in structure. Pulmonic valve  regurgitation is trivial.  12. The inferior vena cava is normal in size with greater than 50%  respiratory variability, suggesting right atrial pressure  of 3 mmHg.    Neuro/Psych neg Seizures  Neuromuscular disease (sciatica) negative psych ROS   GI/Hepatic Neg liver ROS, GERD  ,  Endo/Other  neg diabetesHypothyroidism   Renal/GU CRFRenal disease     Musculoskeletal  (+) Arthritis , Osteoarthritis,    Abdominal (+) - obese,   Peds  Hematology negative hematology ROS (+)   Anesthesia Other Findings Past Medical History: No date: Arthritis No date: Dizziness and giddiness No date: Dysrhythmia No date: GERD (gastroesophageal reflux disease) No date: Gout No date: History of NICM     Comment:  a. Prev EF as low as 35-40%;  b. 03/2012 Echo: EF 50-55%,              Gr 1 DD, mild MR;  c. 06/2014 Echo: EF 50-55%, gr 1 DD,               mild MR, mildly dil LA. No date: History of systolic CHF     Comment:  a. Prev EF as low as 35-40%;  b. 03/2012 Echo: EF 50-55%,              Gr 1 DD, mild MR;  c. 06/2014 Echo: EF 50-55%, gr 1 DD,               mild MR, mildly dil LA. No date: Hypothyroidism No date: Non-obstructive CAD     Comment:  a. 2009 nonobs dzs, LAD 20, EF 40-45%;  b. 07/2012 Ex MV:              EF 55%, freq pvc's, no ischemia/infarct. No date: PAF (paroxysmal atrial fibrillation) (HCC)     Comment:  a.  11/2013 - occurred 3 wks post-op Appe in setting of               pelvic abscess-->amio-->converted but amio later d/c'd               2/2 hypothyroidism. No date: Paroxysmal supraventricular tachycardia (HCC)     Comment:  a. SVT/PAT No date: Prostate enlargement No date: PSVT (paroxysmal supraventricular tachycardia) (HCC)     Comment:  a. 11/2013. No date: PVC's (premature ventricular contractions)     Comment:  a. Asymptomatic - managed with Toprol and Mg. No date: Sciatica of right side   Reproductive/Obstetrics                            Anesthesia Physical  Anesthesia Plan  ASA: III  Anesthesia Plan: General   Post-op Pain Management:    Induction: Intravenous  PONV Risk Score  and Plan: 2 and Propofol infusion and Treatment may vary due to age or medical condition  Airway Management Planned: Natural Airway  Additional Equipment: None  Intra-op Plan:   Post-operative Plan:   Informed Consent: I have reviewed the patients History and Physical, chart, labs and discussed the procedure including the risks, benefits and alternatives for the proposed anesthesia with the patient or authorized representative who has indicated his/her understanding and acceptance.     Dental advisory given  Plan Discussed with: CRNA and Anesthesiologist  Anesthesia Plan Comments: (Discussed risks of anesthesia with patient, including possibility of difficulty with spontaneous ventilation under anesthesia necessitating airway intervention, PONV, and rare risks such as cardiac or respiratory or neurological events. Patient understands.)        Anesthesia Quick Evaluation

## 2021-07-05 NOTE — Interval H&P Note (Signed)
History and Physical Interval Note:  07/05/2021 10:05 AM  Vincent [name]  has presented today for surgery, with the diagnosis of DIARRHEA GERD.  The various methods of treatment have been discussed with the patient and family. After consideration of risks, benefits and other options for treatment, the patient has consented to  Procedure(s) with comments: COLONOSCOPY WITH PROPOFOL (N/A) - REQUEST AM ESOPHAGOGASTRODUODENOSCOPY (EGD) WITH PROPOFOL (N/A) as a surgical intervention.  The patient's history has been reviewed, patient examined, no change in status, stable for surgery.  I have reviewed the patient's chart and labs.  Questions were answered to the patient's satisfaction.     Regis Bill  Ok to proceed with EGD/Colonoscopy

## 2021-07-06 ENCOUNTER — Encounter: Payer: Self-pay | Admitting: Gastroenterology

## 2021-07-06 LAB — SURGICAL PATHOLOGY

## 2021-07-17 ENCOUNTER — Other Ambulatory Visit: Payer: Self-pay | Admitting: Internal Medicine

## 2021-07-18 NOTE — Telephone Encounter (Signed)
Pt last saw Dr Kirke Corin 06/03/21, last labs 08/30/20 Creat 1.2, age 80, weight 98.9kg, based on specified criteria pt is on appropriate dosage of Eliquis 5mg  BID for afib.  Will refill rx.

## 2021-07-30 ENCOUNTER — Other Ambulatory Visit: Payer: Self-pay | Admitting: Cardiovascular Disease

## 2021-07-30 DIAGNOSIS — I480 Paroxysmal atrial fibrillation: Secondary | ICD-10-CM

## 2021-08-10 ENCOUNTER — Other Ambulatory Visit: Payer: Self-pay | Admitting: Urology

## 2021-10-07 ENCOUNTER — Ambulatory Visit (INDEPENDENT_AMBULATORY_CARE_PROVIDER_SITE_OTHER): Payer: Medicare Other | Admitting: Urology

## 2021-10-07 ENCOUNTER — Other Ambulatory Visit: Payer: Self-pay | Admitting: Urology

## 2021-10-07 ENCOUNTER — Encounter: Payer: Self-pay | Admitting: Urology

## 2021-10-07 ENCOUNTER — Other Ambulatory Visit: Payer: Self-pay

## 2021-10-07 VITALS — BP 118/78 | HR 60 | Ht 70.0 in | Wt 210.0 lb

## 2021-10-07 DIAGNOSIS — N401 Enlarged prostate with lower urinary tract symptoms: Secondary | ICD-10-CM

## 2021-10-07 LAB — MICROSCOPIC EXAMINATION: Bacteria, UA: NONE SEEN

## 2021-10-07 LAB — URINALYSIS, COMPLETE
Bilirubin, UA: NEGATIVE
Glucose, UA: NEGATIVE
Ketones, UA: NEGATIVE
Leukocytes,UA: NEGATIVE
Nitrite, UA: NEGATIVE
Protein,UA: NEGATIVE
Specific Gravity, UA: 1.01 (ref 1.005–1.030)
Urobilinogen, Ur: 0.2 mg/dL (ref 0.2–1.0)
pH, UA: 6 (ref 5.0–7.5)

## 2021-10-07 LAB — BLADDER SCAN AMB NON-IMAGING: Scan Result: 27

## 2021-10-07 MED ORDER — GEMTESA 75 MG PO TABS: 75.0000 mg | ORAL_TABLET | Freq: Every day | ORAL | 0 refills | Status: DC

## 2021-10-07 NOTE — Progress Notes (Signed)
10/07/2021 11:56 AM   Vincent [name] [birth year] [id number]  Referring provider: Tracie Harrier, MD [address] [facility] [place],  [place] [address]  Chief Complaint  Patient presents with   Benign Prostatic Hypertrophy    Urologic history: 1.  BPH with lower urinary tract symptoms -Finasteride 5 mg daily -Tamsulosin added last year for increased urgency/nocturia   2.  Elevated PSA -Prostate biopsy early 2000 PSA 6.1; benign -Rebiopsy 2006; PSA 6.6; benign -Started on dutasteride with baseline PSA low-mid 2 range. -Prostate cancer screening discontinued 2019  HPI: 80 y.o. male presents for annual follow-up.  At last years visit had more bothersome storage related voiding symptoms No significant improvement with Myrbetriq Denies dysuria, gross hematuria No flank, abdominal or pelvic pain   PMH: Past Medical History:  Diagnosis Date   Arthritis    Dizziness and giddiness    Dysrhythmia    GERD (gastroesophageal reflux disease)    Gout    History of kidney stones    History of NICM    a. Prev EF as low as 35-40%;  b. 03/2012 Echo: EF 50-55%, Gr 1 DD, mild MR;  c. 06/2014 Echo: EF 50-55%, gr 1 DD, mild MR, mildly dil LA; d. 11/2019 Echo: EF 50-55%.   History of systolic CHF    a. Prev EF as low as 35-40%;  b. 03/2012 Echo: EF 50-55%, Gr 1 DD, mild MR;  c. 06/2014 Echo: EF 50-55%, gr 1 DD; d. 11/2019 Echo: EF 50-55%, no rwma, mildly dil LA, nl RV fxn, triv MR/PR.   Hypothyroidism    Non-obstructive CAD    a. 2009 nonobs dzs, LAD 20, EF 40-45%;  b. 07/2012 Ex MV: EF 55%, freq pvc's, no ischemia/infarct.   PAF (paroxysmal atrial fibrillation) ([place])    a. 11/2013 - occurred 3 wks post-op Appe in setting of pelvic abscess-->amio-->converted but amio later d/c'd 2/2 hypothyroidism; b. 01/2020 recurrent rate-controlled Afib-->Eliquis-->s/p DCCV 02/2020.   Paroxysmal Atrial flutter ([place])    a. 12/2019 s/p RFCA.   Paroxysmal supraventricular tachycardia  (HCC)    a. SVT/PAT   Prostate enlargement    PSVT (paroxysmal supraventricular tachycardia) ([place])    a. 12/2019 s/p RFCA.   PVC's (premature ventricular contractions)    a. Asymptomatic - managed with Toprol and Mg.   Renal insufficiency    Sciatica of right side     Surgical History: Past Surgical History:  Procedure Laterality Date   ABSCESS DRAINAGE     APPENDECTOMY  2015   BACK SURGERY     CARDIAC CATHETERIZATION  2009   CARDIOVERSION N/A 03/15/2020   Procedure: CARDIOVERSION;  Surgeon: Wellington Hampshire, MD;  Location: [facility] OR[facility];  Service: Cardiovascular;  Laterality: N/A;   CATARACT EXTRACTION Right    CATARACT EXTRACTION W/PHACO Right 10/17/2017   Procedure: CATARACT EXTRACTION PHACO AND INTRAOCULAR LENS PLACEMENT ([place]) RIGHT;  Surgeon: Leandrew Koyanagi, MD;  Location: [place];  Service: Ophthalmology;  Laterality: Right;   CATARACT EXTRACTION W/PHACO Left 12/24/2017   Procedure: CATARACT EXTRACTION PHACO AND INTRAOCULAR LENS PLACEMENT ([place]) LEFT;  Surgeon: Leandrew Koyanagi, MD;  Location: [place];  Service: Ophthalmology;  Laterality: Left;   COLONOSCOPY     COLONOSCOPY WITH PROPOFOL N/A 11/06/2018   Procedure: COLONOSCOPY WITH PROPOFOL;  Surgeon: Tol[place], Benay Pike, MD;  Location: [facility] ENDOSCOPY;  Service: Gastroenterology;  Laterality: N/A;   COLONOSCOPY WITH PROPOFOL N/A 07/05/2021   Procedure: COLONOSCOPY WITH PROPOFOL;  Surgeon: Lesly Rubenstein, MD;  Location: [facility]  ENDOSCOPY;  Service: Endoscopy;  Laterality: N/A;  REQUEST AM   DUPUYTREN / PALMAR FASCIOTOMY Bilateral    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   Procedure: DUPUYTREN CONTRACTURE RELEASE;  Surgeon: [name], MD;  Location: [facility] OR[facility];  Service: Orthopedics;  Laterality: Left;  left index and fourth finger   ESOPHAGOGASTRODUODENOSCOPY (EGD) WITH PROPOFOL N/A 07/05/2021   Procedure: ESOPHAGOGASTRODUODENOSCOPY (EGD) WITH PROPOFOL;  Surgeon: Regis Bill, MD;   Location: [facility] ENDOSCOPY;  Service: Endoscopy;  Laterality: N/A;   HAND SURGERY  1999   JOINT REPLACEMENT Right 2012   TKR   PARTIAL KNEE ARTHROPLASTY Left    SVT ABLATION N/A 12/29/2019   Procedure: SVT ABLATION;  Surgeon: Marinus Maw, MD;  Location: [facility];  Service: Cardiovascular;  Laterality: N/A;   TOTAL KNEE ARTHROPLASTY Right 06/17/2017    Home Medications:  Allergies as of 10/07/2021   No Known Allergies      Medication List        Accurate as of October 07, 2021 11:56 AM. If you have any questions, ask your nurse or doctor.          STOP taking these medications    HYDROcodone-acetaminophen 7.5-325 MG tablet Commonly known as: Norco Stopped by: [name], MD   lipase/protease/amylase 12000-38000 units Cpep capsule Commonly known as: CREON Stopped by: [name], MD   mirabegron ER 25 MG Tb24 tablet Commonly known as: MYRBETRIQ Stopped by: [name], MD   multivitamin tablet Stopped by: [name], MD       TAKE these medications    acetaminophen 500 MG tablet Commonly known as: TYLENOL Take 1,000 mg by mouth every 6 (six) hours as needed for moderate pain or headache.   amoxicillin 500 MG tablet Commonly known as: AMOXIL Take 2,000 mg by mouth See admin instructions. Take 4 capsules (2000 mg) by mouth 1 hour prior to dental procedures.   Biotin 5000 MCG Tabs Take 5,000 mcg by mouth daily.   cetirizine 10 MG tablet Commonly known as: ZYRTEC Take 10 mg by mouth daily.   diclofenac 75 MG EC tablet Commonly known as: VOLTAREN Take 75 mg by mouth 2 (two) times daily.   Eliquis 5 MG Tabs tablet Generic drug: apixaban TAKE 1 TABLET BY MOUTH TWICE A DAY   ferrous sulfate 324 MG Tbec Take 324 mg by mouth.   Fiber Therapy 500 MG Tabs Generic drug: Methylcellulose (Laxative) Take 500 mg by mouth 2 (two) times daily.   finasteride 5 MG tablet Commonly known as: PROSCAR TAKE 1 TABLET (5 MG TOTAL) BY  MOUTH DAILY.   gabapentin 300 MG capsule Commonly known as: NEURONTIN Take 300 mg by mouth 4 (four) times daily.   hyoscyamine 0.125 MG Tbdp disintergrating tablet Commonly known as: ANASPAZ Place 0.125 mg under the tongue every 6 (six) hours as needed.   levothyroxine 25 MCG tablet Commonly known as: SYNTHROID Take 25 mcg by mouth daily before breakfast.   LUBRICATING EYE DROPS OP Apply 1 drop to eye 2 (two) times daily as needed (dry eyes).   magnesium oxide 400 MG tablet Commonly known as: MAG-OX Take 400 mg by mouth 2 (two) times daily.   metoprolol succinate 25 MG 24 hr tablet Commonly known as: TOPROL-XL TAKE 1 TABLET DAILY   pantoprazole 40 MG tablet Commonly known as: PROTONIX Take 40 mg by mouth daily.   predniSONE 10 MG tablet Commonly known as: DELTASONE Take 10 mg by mouth 2 (two) times  daily.   rosuvastatin 5 MG tablet Commonly known as: CRESTOR Take 5 mg by mouth daily.   sodium chloride 0.65 % Soln nasal spray Commonly known as: OCEAN Place 1 spray into both nostrils at bedtime.   tamsulosin 0.4 MG Caps capsule Commonly known as: FLOMAX Take 1 capsule (0.4 mg total) by mouth daily.   Vitamin B 12 500 MCG Tabs Take by mouth.   Zinc 50 MG Tabs Take 50 mg by mouth daily.        Allergies: No Known Allergies  Family History: Family History  Problem Relation Age of Onset   Heart attack Father 80   Coronary artery disease Other        family hx   Heart failure Other        family hx - CHF    Social History:  reports that he has never smoked. He has never used smokeless tobacco. He reports that he does not drink alcohol and does not use drugs.   Physical Exam: BP 118/78   Pulse 60   Ht 5\' 10"  (1.778 m)   Wt 210 lb (95.3 kg)   BMI 30.13 kg/m   Constitutional:  Alert and oriented, No acute distress. HEENT: [place] AT, moist mucus membranes.  Trachea midline, no masses. Cardiovascular: No clubbing, cyanosis, or edema. Respiratory: Normal  respiratory effort, no increased work of breathing. Psychiatric: Normal mood and affect.   Assessment & Plan:    1.  BPH with LUTS Bladder scan PVR 27 mL Bothersome storage related voiding symptoms-no improvement with Myrbetriq We discussed a trial of Gemtesa Prostate outlet procedures were discussed however since his most bothersome symptoms are storage related surgery may not be effective and if considering surgery would recommend a urodynamic study.  At this point he is not interested in any surgical procedures We also discussed PTNS.  With history of BPH Botox would potentially cause urinary retention He would like to try Gemtesa and was given samples x4 weeks.  If effective he will call back for an Rx   Abbie Sons, MD  [facility] [address] [place],  [address] [phone]

## 2021-10-12 ENCOUNTER — Other Ambulatory Visit: Payer: Self-pay | Admitting: Cardiovascular Disease

## 2021-10-12 DIAGNOSIS — I480 Paroxysmal atrial fibrillation: Secondary | ICD-10-CM

## 2021-10-27 ENCOUNTER — Other Ambulatory Visit: Payer: Self-pay | Admitting: Urology

## 2021-12-28 ENCOUNTER — Other Ambulatory Visit: Payer: Self-pay | Admitting: Cardiovascular Disease

## 2021-12-28 DIAGNOSIS — I480 Paroxysmal atrial fibrillation: Secondary | ICD-10-CM

## 2021-12-29 NOTE — Telephone Encounter (Signed)
Please schedule overdue 6 month F/U for 90 day refills. Thank you!

## 2022-01-03 NOTE — Telephone Encounter (Signed)
Please schedule overdue 6 month F/U appointment for refills. Thank you! 

## 2022-01-03 NOTE — Telephone Encounter (Signed)
LVM to schedule

## 2022-01-08 ENCOUNTER — Other Ambulatory Visit: Payer: Self-pay | Admitting: Cardiovascular Disease

## 2022-01-08 DIAGNOSIS — I480 Paroxysmal atrial fibrillation: Secondary | ICD-10-CM

## 2022-01-09 NOTE — Telephone Encounter (Signed)
Eliquis 5mg  refill request received. Patient is 81 years old, weight-95.3kg, Crea-1.40 on 08/29/21 via [facility] from [facility], Diagnosis-Aflutter/Afib, and last seen by Dr. [facility] on 06/03/2021. Dose is appropriate based on dosing criteria. Will send in refill to requested pharmacy.

## 2022-01-09 NOTE — Telephone Encounter (Signed)
Refill Request.  

## 2022-01-11 ENCOUNTER — Other Ambulatory Visit: Payer: Self-pay | Admitting: Urology

## 2022-02-27 ENCOUNTER — Other Ambulatory Visit: Payer: Self-pay | Admitting: Cardiovascular Disease

## 2022-02-27 DIAGNOSIS — I480 Paroxysmal atrial fibrillation: Secondary | ICD-10-CM

## 2022-03-21 ENCOUNTER — Ambulatory Visit (INDEPENDENT_AMBULATORY_CARE_PROVIDER_SITE_OTHER): Payer: Medicare Other | Admitting: Cardiovascular Disease

## 2022-03-21 ENCOUNTER — Encounter: Payer: Self-pay | Admitting: Cardiovascular Disease

## 2022-03-21 VITALS — BP 110/70 | HR 55 | Ht 70.5 in | Wt 226.4 lb

## 2022-03-21 DIAGNOSIS — I471 Supraventricular tachycardia: Secondary | ICD-10-CM | POA: Diagnosis not present

## 2022-03-21 DIAGNOSIS — E785 Hyperlipidemia, unspecified: Secondary | ICD-10-CM | POA: Diagnosis not present

## 2022-03-21 DIAGNOSIS — I4819 Other persistent atrial fibrillation: Secondary | ICD-10-CM | POA: Diagnosis not present

## 2022-03-21 DIAGNOSIS — I493 Ventricular premature depolarization: Secondary | ICD-10-CM | POA: Diagnosis not present

## 2022-03-21 NOTE — Patient Instructions (Signed)

## 2022-03-21 NOTE — Progress Notes (Signed)
?  ?Cardiology Office Note ? ? ?Date:  03/21/2022  ? ?ID:  Vincent [name], DOB [birth year], MRN [id number] ? ?PCP:  [name], MD  ?Cardiologist:   [name], MD  ? ?Chief Complaint  ?Patient presents with  ? Other  ?  6 Month f/u c/o giving out more than usual. Meds reviewed verbally with pt.  ? ? ?  ?History of Present Illness: ?Vincent [name] is a 81 y.o. male who presents for a follow up  regarding supraventricular tachycardia, mild cardiomyopathy, paroxysmal atrial fibrillation, atrial flutter and ventricular bigeminy.  ?Cardiac catheterization in 2009 showed an ejection fraction of 40-45% range with a mild nonobstructive 20% plaque in his LAD.  ? ?Most recent echocardiogram in January 2021 showed an EF of 50 to 55% with no significant valvular abnormalities. ?The patient had symptomatic SVT and atrial flutter status post ablation by Dr. Ladona Ridgel in February 2021.  He was found to be in atrial fibrillation after ablation and was subsequently started on anticoagulation and underwent cardioversion in April with no recurrent arrhythmia since then. ? ?Most recent ischemic cardiac evaluation in May 2021 included a Lexiscan Myoview which showed no evidence of ischemia with normal ejection fraction. ? ?He has been doing well with no recent chest pain or palpitations.  He reports stable exertional dyspnea.  He continues to play golf on a regular basis with no significant limitations.  No side effects with anticoagulation. ? ?Past Medical History:  ?Diagnosis Date  ? Arthritis   ? Dizziness and giddiness   ? Dysrhythmia   ? GERD (gastroesophageal reflux disease)   ? Gout   ? History of kidney stones   ? History of NICM   ? a. Prev EF as low as 35-40%;  b. 03/2012 Echo: EF 50-55%, Gr 1 DD, mild MR;  c. 06/2014 Echo: EF 50-55%, gr 1 DD, mild MR, mildly dil LA; d. 11/2019 Echo: EF 50-55%.  ? History of systolic CHF   ? a. Prev EF as low as 35-40%;  b. 03/2012 Echo: EF 50-55%, Gr 1 DD, mild MR;  c. 06/2014  Echo: EF 50-55%, gr 1 DD; d. 11/2019 Echo: EF 50-55%, no rwma, mildly dil LA, nl RV fxn, triv MR/PR.  ? Hypothyroidism   ? Non-obstructive CAD   ? a. 2009 nonobs dzs, LAD 20, EF 40-45%;  b. 07/2012 Ex MV: EF 55%, freq pvc's, no ischemia/infarct.  ? PAF (paroxysmal atrial fibrillation) (HCC)   ? a. 11/2013 - occurred 3 wks post-op Appe in setting of pelvic abscess-->amio-->converted but amio later d/c'd 2/2 hypothyroidism; b. 01/2020 recurrent rate-controlled Afib-->Eliquis-->s/p DCCV 02/2020.  ? Paroxysmal Atrial flutter (HCC)   ? a. 12/2019 s/p RFCA.  ? Paroxysmal supraventricular tachycardia (HCC)   ? a. SVT/PAT  ? Prostate enlargement   ? PSVT (paroxysmal supraventricular tachycardia) (HCC)   ? a. 12/2019 s/p RFCA.  ? PVC's (premature ventricular contractions)   ? a. Asymptomatic - managed with Toprol and Mg.  ? Renal insufficiency   ? Sciatica of right side   ? ? ?Past Surgical History:  ?Procedure Laterality Date  ? ABSCESS DRAINAGE    ? APPENDECTOMY  2015  ? BACK SURGERY    ? CARDIAC CATHETERIZATION  2009  ? CARDIOVERSION N/A 03/15/2020  ? Procedure: CARDIOVERSION;  Surgeon: Iran Ouch, MD;  Location: [facility];  Service: Cardiovascular;  Laterality: N/A;  ? CATARACT EXTRACTION Right   ? CATARACT EXTRACTION W/PHACO Right 10/17/2017  ? Procedure: CATARACT EXTRACTION PHACO AND INTRAOCULAR  LENS PLACEMENT (IOC) RIGHT;  Surgeon: [name], MD;  Location: [facility];  Service: Ophthalmology;  Laterality: Right;  ? CATARACT EXTRACTION W/PHACO Left 12/24/2017  ? Procedure: CATARACT EXTRACTION PHACO AND INTRAOCULAR LENS PLACEMENT (IOC) LEFT;  Surgeon: [name], MD;  Location: [facility];  Service: Ophthalmology;  Laterality: Left;  ? COLONOSCOPY    ? COLONOSCOPY WITH PROPOFOL N/A 11/06/2018  ? Procedure: COLONOSCOPY WITH PROPOFOL;  Surgeon: Toledo, Boykin Nearing, MD;  Location: [facility];  Service: Gastroenterology;  Laterality: N/A;  ? COLONOSCOPY WITH PROPOFOL N/A 07/05/2021  ?  Procedure: COLONOSCOPY WITH PROPOFOL;  Surgeon: Regis Bill, MD;  Location: [facility];  Service: Endoscopy;  Laterality: N/A;  REQUEST AM  ? DUPUYTREN / PALMAR FASCIOTOMY Bilateral   ? 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 Procedure: DUPUYTREN CONTRACTURE RELEASE;  Surgeon: [name], MD;  Location: [facility];  Service: Orthopedics;  Laterality: Left;  left index and fourth finger  ? ESOPHAGOGASTRODUODENOSCOPY (EGD) WITH PROPOFOL N/A 07/05/2021  ? Procedure: ESOPHAGOGASTRODUODENOSCOPY (EGD) WITH PROPOFOL;  Surgeon: Regis Bill, MD;  Location: [facility];  Service: Endoscopy;  Laterality: N/A;  ? HAND SURGERY  1999  ? JOINT REPLACEMENT Right 2012  ? TKR  ? PARTIAL KNEE ARTHROPLASTY Left   ? SVT ABLATION N/A 12/29/2019  ? Procedure: SVT ABLATION;  Surgeon: Marinus Maw, MD;  Location: [facility];  Service: Cardiovascular;  Laterality: N/A;  ? TOTAL KNEE ARTHROPLASTY Right 06/17/2017  ? ? ? ?Current Outpatient Medications  ?Medication Sig Dispense Refill  ? acetaminophen (TYLENOL) 500 MG tablet Take 1,000 mg by mouth every 6 (six) hours as needed for moderate pain or headache.     ? amoxicillin (AMOXIL) 500 MG tablet Take 2,000 mg by mouth See admin instructions. Take 4 capsules (2000 mg) by mouth 1 hour prior to dental procedures.    ? Biotin 5000 MCG TABS Take 5,000 mcg by mouth daily.    ? Carboxymethylcellul-Glycerin (LUBRICATING EYE DROPS OP) Apply 1 drop to eye 2 (two) times daily as needed (dry eyes).    ? cetirizine (ZYRTEC) 10 MG tablet Take 10 mg by mouth daily.    ? Cyanocobalamin (VITAMIN B 12) 500 MCG TABS Take by mouth.    ? diclofenac (VOLTAREN) 75 MG EC tablet Take 75 mg by mouth daily.    ? ELIQUIS 5 MG TABS tablet TAKE 1 TABLET BY MOUTH TWICE A DAY 180 tablet 1  ? ferrous sulfate 324 MG TBEC Take 324 mg by mouth.    ? FIBER THERAPY 500 MG TABS Take 500 mg by mouth 2 (two) times daily.    ? finasteride (PROSCAR) 5 MG tablet TAKE 1 TABLET (5 MG TOTAL) BY  MOUTH DAILY. 90 tablet 1  ? gabapentin (NEURONTIN) 300 MG capsule Take 300 mg by mouth 4 (four) times daily.    ? hyoscyamine (ANASPAZ) 0.125 MG TBDP disintergrating tablet Place 0.125 mg under the tongue every 6 (six) hours as needed.    ? levothyroxine (SYNTHROID, LEVOTHROID) 25 MCG tablet Take 25 mcg by mouth daily before breakfast.    ? magnesium oxide (MAG-OX) 400 MG tablet Take 400 mg by mouth 2 (two) times daily.     ? metoprolol succinate (TOPROL-XL) 25 MG 24 hr tablet TAKE 1 TABLET DAILY 90 tablet 0  ? pantoprazole (PROTONIX) 40 MG tablet Take 40 mg by mouth daily.    ? rosuvastatin (CRESTOR) 5 MG tablet Take 5 mg by mouth daily.     ?  sodium chloride (OCEAN) 0.65 % SOLN nasal spray Place 1 spray into both nostrils at bedtime.     ? tamsulosin (FLOMAX) 0.4 MG CAPS capsule TAKE 1 CAPSULE BY MOUTH EVERY DAY 90 capsule 3  ? Zinc 50 MG TABS Take 50 mg by mouth daily.    ? ?No current facility-administered medications for this visit.  ? ? ?Allergies:   Patient has no known allergies.  ? ? ?Social History:  The patient  reports that he has never smoked. He has never used smokeless tobacco. He reports that he does not drink alcohol and does not use drugs.  ? ?Family History:  The patient's family history includes Coronary artery disease in an other family member; Heart attack (age of onset: 59) in his father; Heart failure in an other family member.  ? ? ?ROS:  Please see the history of present illness.   Otherwise, review of systems are positive for none.   All other systems are reviewed and negative.  ? ? ?PHYSICAL EXAM: ?VS:  BP 110/70 (BP Location: Left Arm, Patient Position: Sitting, Cuff Size: Normal)   Pulse (!) 55   Ht 5' 10.5" (1.791 m)   Wt 226 lb 6 oz (102.7 kg)   SpO2 97%   BMI 32.02 kg/m?  , BMI Body mass index is 32.02 kg/m?. ?GEN: Well nourished, well developed, in no acute distress  ?HEENT: normal  ?Neck: no JVD, carotid bruits, or masses ?Cardiac: Irregularly irregular; no murmurs, rubs, or  gallops,no edema  ?Respiratory:  clear to auscultation bilaterally, normal work of breathing ?GI: soft, nontender, nondistended, + BS ?MS: no deformity or atrophy  ?Skin: warm and dry, no rash ?Neuro:  Strength and

## 2022-04-11 ENCOUNTER — Other Ambulatory Visit: Payer: Self-pay | Admitting: Cardiovascular Disease

## 2022-04-11 DIAGNOSIS — I480 Paroxysmal atrial fibrillation: Secondary | ICD-10-CM

## 2022-04-11 NOTE — Telephone Encounter (Signed)
Refill Request.  

## 2022-04-11 NOTE — Telephone Encounter (Signed)
Prescription refill request for Eliquis received. Indication: PAF Last office visit: 03/21/22  Terrilee Files MD Scr: 1.4 on 08/29/21 Age:  81 Weight: 102.7kg  Based on above findings Eliquis 5mg  twice daily is the appropriate dose.  Refill approved.

## 2022-04-15 ENCOUNTER — Other Ambulatory Visit: Payer: Self-pay | Admitting: Urology

## 2022-05-02 ENCOUNTER — Telehealth: Payer: Self-pay | Admitting: Cardiovascular Disease

## 2022-05-02 NOTE — Telephone Encounter (Signed)
Lmom for pt to call back. 

## 2022-05-02 NOTE — Telephone Encounter (Signed)
Pt c/o medication issue:  1. Name of Medication:   ELIQUIS 5 MG TABS tablet    2. How are you currently taking this medication (dosage and times per day)? TAKE 1 TABLET BY MOUTH TWICE A DAY  3. Are you having a reaction (difficulty breathing--STAT)? No  4. What is your medication issue?  Pt states that the pharmacy has let him know that his "Discount Card" for medication has run out. Pt would like to apply for another one. Please advise

## 2022-05-02 NOTE — Telephone Encounter (Signed)
Spoke w/ pt.  Advised him that the discount card can usually only be used once, but he can go to the [employer] and fill out the pt assistance paperwork. Advised him that warfarin may be a possibility. He will look into the pt assistance and contact the office to have Dr. Kirke Corin complete his portion. He is appreciative of the call.

## 2022-05-03 ENCOUNTER — Telehealth: Payer: Self-pay | Admitting: Cardiovascular Disease

## 2022-05-03 NOTE — Telephone Encounter (Signed)
Attempted to return call to pt, I can send in rx refill for Eliquis 90 day supply, but this was already done on 04/11/22, so there is a rx already at the pharmacy.  I do not know what copay ID card pt is referring to in this message.  I see he spoke with Darla Lesches, RN yesterday and she advised him that the discount card can usually only be used once, but he can go to the [employer] and fill out the pt assistance paperwork. Advised him that warfarin may be a possibility. He will look into the pt assistance and contact the office to have Dr. Kirke Corin complete his portion. Pt states he called and the copay car has already been renewed, he just needs 90 day rx sent to pharmacy, advised pt we refilled that on 04/11/22 so he should have a current 90 day rx at pharmacy and they should be able to apply that card to that current rx.  Pt thanked me for my call back and he will call the pharmacy to have them apply the copay card to his current rx.  Advised pt TCB if needed anything further from [place] after he calls his pharmacy.

## 2022-05-03 NOTE — Telephone Encounter (Signed)
  Pt c/o medication issue:  1. Name of Medication: ELIQUIS 5 MG TABS tablet  2. How are you currently taking this medication (dosage and times per day)? TAKE 1 TABLET BY MOUTH TWICE A DAY  3. Are you having a reaction (difficulty breathing--STAT)?   4. What is your medication issue? Pt said, [facility] [address] called him that they need a written prescription send to the for eliquis including his copay ID card# [id number]. He said refill should be 90 days

## 2022-06-06 ENCOUNTER — Telehealth: Payer: Self-pay | Admitting: Cardiovascular Disease

## 2022-06-06 NOTE — Telephone Encounter (Signed)
Will route to Dr. Arida to advise. 

## 2022-06-06 NOTE — Telephone Encounter (Signed)
Pt c/o medication issue:  1. Name of Medication: Medrol 6 day tapered regimen  2. How are you currently taking this medication (dosage and times per day)? Patient has not started taking yet   3. Are you having a reaction (difficulty breathing--STAT)?   4. What is your medication issue? Dr. Beverly Milch at [facility] prescribed this for the patient to take. The patient wanted to check with Dr. Kirke Corin before he starts taking it to [facility] sure there are not any interactions with his Cardiac regimen

## 2022-06-06 NOTE — Telephone Encounter (Signed)
This should be fine 

## 2022-06-07 NOTE — Telephone Encounter (Signed)
Patient made aware of Dr. Arida's response with verbalized understanding. Patient voiced appreciation for the call. 

## 2022-07-31 ENCOUNTER — Telehealth: Payer: Self-pay | Admitting: Cardiovascular Disease

## 2022-07-31 DIAGNOSIS — I480 Paroxysmal atrial fibrillation: Secondary | ICD-10-CM

## 2022-07-31 MED ORDER — METOPROLOL SUCCINATE ER 25 MG PO TB24: 25.0000 mg | ORAL_TABLET | Freq: Every day | ORAL | 0 refills | Status: DC

## 2022-07-31 NOTE — Telephone Encounter (Signed)
*  STAT* If patient is at the pharmacy, call can be transferred to refill team.   1. Which medications need to be refilled? (please list name of each medication and dose if known)  metoprolol succinate (TOPROL-XL) 25 MG 24 hr tablet  2. Which pharmacy/location (including street and city if local pharmacy) is medication to be sent to? [facility] - [place], [place] - [address] AT Portal to Registered [facility] Sites  3. Do they need a 30 day or 90 day supply? 90 day supply  

## 2022-07-31 NOTE — Telephone Encounter (Signed)
Requested Prescriptions   Signed Prescriptions Disp Refills   metoprolol succinate (TOPROL-XL) 25 MG 24 hr tablet 90 tablet 0    Sig: Take 1 tablet (25 mg total) by mouth daily.    Authorizing Provider: ARIDA, MUHAMMAD A    Ordering User: NEWCOMER MCCLAIN, Nichols Corter L    

## 2022-09-21 ENCOUNTER — Ambulatory Visit: Payer: Medicare Other | Attending: Cardiovascular Disease | Admitting: Cardiovascular Disease

## 2022-09-21 ENCOUNTER — Encounter: Payer: Self-pay | Admitting: Cardiovascular Disease

## 2022-09-21 VITALS — BP 112/64 | HR 61 | Ht 70.5 in | Wt 205.4 lb

## 2022-09-21 DIAGNOSIS — E785 Hyperlipidemia, unspecified: Secondary | ICD-10-CM | POA: Diagnosis not present

## 2022-09-21 DIAGNOSIS — I480 Paroxysmal atrial fibrillation: Secondary | ICD-10-CM

## 2022-09-21 DIAGNOSIS — I493 Ventricular premature depolarization: Secondary | ICD-10-CM

## 2022-09-21 DIAGNOSIS — R0609 Other forms of dyspnea: Secondary | ICD-10-CM

## 2022-09-21 MED ORDER — METOPROLOL SUCCINATE ER 25 MG PO TB24: 25.0000 mg | ORAL_TABLET | Freq: Every day | ORAL | 3 refills | Status: DC

## 2022-09-21 NOTE — Patient Instructions (Signed)
Medication Instructions:  Your physician recommends that you continue on your current medications as directed. Please refer to the Current Medication list given to you today.  *If you need a refill on your cardiac medications before your next appointment, please call your pharmacy*   Lab Work: None ordered If you have labs (blood work) drawn today and your tests are completely normal, you will receive your results only by: [place] (if you have MyChart) OR A paper copy in the mail If you have any lab test that is abnormal or we need to change your treatment, we will call you to review the results.   Testing/Procedures: Your physician has requested that you have an echocardiogram. Echocardiography is a painless test that uses sound waves to create images of your heart. It provides your doctor with information about the size and shape of your heart and how well your heart's chambers and valves are working. This procedure takes approximately one hour. There are no restrictions for this procedure. Please do NOT wear cologne, perfume, aftershave, or lotions (deodorant is allowed). Please arrive 15 minutes prior to your appointment time.    Follow-Up: At [facility], you and your health needs are our priority.  As part of our continuing mission to provide you with exceptional heart care, we have created designated Provider Care Teams.  These Care Teams include your primary Cardiologist (physician) and Advanced Practice Providers (APPs -  Physician Assistants and Nurse Practitioners) who all work together to provide you with the care you need, when you need it.  Your next appointment:   6 month(s)  The format for your next appointment:   In Person  Provider:   Kathlyn Sacramento, MD   Important Information About Sugar

## 2022-09-21 NOTE — Progress Notes (Signed)
Cardiology Office Note   Date:  09/21/2022   ID:  [name], Vincent [name] 13, 1942, MRN [id number]  PCP:  [name], MD  Cardiologist:   [name], MD   Chief Complaint  Patient presents with   Other    6 month f/u c/o afib, fatigue and anxiety at bedtime. Meds reviewed verbally with pt.      History of Present Illness: [name] is a 81 y.o. male who presents for a follow up  regarding supraventricular tachycardia, mild cardiomyopathy, paroxysmal atrial fibrillation, atrial flutter and ventricular bigeminy.  Cardiac catheterization in 2009 showed an ejection fraction of 40-45% range with a mild nonobstructive 20% plaque in his LAD.   Most recent echocardiogram in January 2021 showed an EF of 50 to 55% with no significant valvular abnormalities. The patient had symptomatic SVT and atrial flutter status post ablation by Dr. Ladona Ridgel in February 2021.  He was found to be in atrial fibrillation after ablation and was subsequently started on anticoagulation and underwent cardioversion  no recurrent arrhythmia since then.  Most recent ischemic cardiac evaluation in May 2021 included a [facility] which showed no evidence of ischemia with normal ejection fraction.  He has been under significant stress lately due to death of his sister and illness of his wife.  In that setting, he reports worsening palpitations especially at night.  In addition, he reports increased exertional dyspnea and fatigue.  No chest discomfort.  Past Medical History:  Diagnosis Date   Arthritis    Dizziness and giddiness    Dysrhythmia    GERD (gastroesophageal reflux disease)    Gout    History of kidney stones    History of NICM    a. Prev EF as low as 35-40%;  b. 03/2012 Echo: EF 50-55%, Gr 1 DD, mild MR;  c. 06/2014 Echo: EF 50-55%, gr 1 DD, mild MR, mildly dil LA; d. 11/2019 Echo: EF 50-55%.   History of systolic CHF    a. Prev EF as low as 35-40%;  b. 03/2012 Echo: EF 50-55%,  Gr 1 DD, mild MR;  c. 06/2014 Echo: EF 50-55%, gr 1 DD; d. 11/2019 Echo: EF 50-55%, no rwma, mildly dil LA, nl RV fxn, triv MR/PR.   Hypothyroidism    Non-obstructive CAD    a. 2009 nonobs dzs, LAD 20, EF 40-45%;  b. 07/2012 Ex MV: EF 55%, freq pvc's, no ischemia/infarct.   PAF (paroxysmal atrial fibrillation) (HCC)    a. 11/2013 - occurred 3 wks post-op Appe in setting of pelvic abscess-->amio-->converted but amio later d/c'd 2/2 hypothyroidism; b. 01/2020 recurrent rate-controlled Afib-->Eliquis-->s/p DCCV 02/2020.   Paroxysmal Atrial flutter (HCC)    a. 12/2019 s/p RFCA.   Paroxysmal supraventricular tachycardia    a. SVT/PAT   Prostate enlargement    PSVT (paroxysmal supraventricular tachycardia)    a. 12/2019 s/p RFCA.   PVC's (premature ventricular contractions)    a. Asymptomatic - managed with Toprol and Mg.   Renal insufficiency    Sciatica of right side     Past Surgical History:  Procedure Laterality Date   ABSCESS DRAINAGE     APPENDECTOMY  2015   BACK SURGERY     CARDIAC CATHETERIZATION  2009   CARDIOVERSION N/A 03/15/2020   Procedure: CARDIOVERSION;  Surgeon: Iran Ouch, MD;  Location: [facility];  Service: Cardiovascular;  Laterality: N/A;   CATARACT EXTRACTION Right    CATARACT EXTRACTION W/PHACO Right 10/17/2017   Procedure: CATARACT EXTRACTION PHACO  AND INTRAOCULAR LENS PLACEMENT (IOC) RIGHT;  Surgeon: [name], MD;  Location: [facility];  Service: Ophthalmology;  Laterality: Right;   CATARACT EXTRACTION W/PHACO Left 12/24/2017   Procedure: CATARACT EXTRACTION PHACO AND INTRAOCULAR LENS PLACEMENT (IOC) LEFT;  Surgeon: [name], MD;  Location: [facility];  Service: Ophthalmology;  Laterality: Left;   COLONOSCOPY     COLONOSCOPY WITH PROPOFOL N/A 11/06/2018   Procedure: COLONOSCOPY WITH PROPOFOL;  Surgeon: Toledo, Boykin Nearing, MD;  Location: [facility];  Service: Gastroenterology;  Laterality: N/A;   COLONOSCOPY WITH PROPOFOL  N/A 07/05/2021   Procedure: COLONOSCOPY WITH PROPOFOL;  Surgeon: Regis Bill, MD;  Location: [facility];  Service: Endoscopy;  Laterality: N/A;  REQUEST AM   DUPUYTREN / PALMAR FASCIOTOMY Bilateral    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   Procedure: DUPUYTREN CONTRACTURE RELEASE;  Surgeon: [name], MD;  Location: [facility];  Service: Orthopedics;  Laterality: Left;  left index and fourth finger   ESOPHAGOGASTRODUODENOSCOPY (EGD) WITH PROPOFOL N/A 07/05/2021   Procedure: ESOPHAGOGASTRODUODENOSCOPY (EGD) WITH PROPOFOL;  Surgeon: Regis Bill, MD;  Location: [facility];  Service: Endoscopy;  Laterality: N/A;   HAND SURGERY  1999   JOINT REPLACEMENT Right 2012   TKR   PARTIAL KNEE ARTHROPLASTY Left    SVT ABLATION N/A 12/29/2019   Procedure: SVT ABLATION;  Surgeon: Marinus Maw, MD;  [facility];  Service: Cardiovascular;  Laterality: N/A;   TOTAL KNEE ARTHROPLASTY Right 06/17/2017     Current Outpatient Medications  Medication Sig Dispense Refill   acetaminophen (TYLENOL) 500 MG tablet Take 1,000 mg by mouth every 6 (six) hours as needed for moderate pain or headache.      amoxicillin (AMOXIL) 500 MG tablet Take 2,000 mg by mouth See admin instructions. Take 4 capsules (2000 mg) by mouth 1 hour prior to dental procedures.     Biotin 5000 MCG TABS Take 5,000 mcg by mouth daily.     Carboxymethylcellul-Glycerin (LUBRICATING EYE DROPS OP) Apply 1 drop to eye 2 (two) times daily as needed (dry eyes).     cetirizine (ZYRTEC) 10 MG tablet Take 10 mg by mouth daily.     Cyanocobalamin (VITAMIN B 12) 500 MCG TABS Take by mouth.     ELIQUIS 5 MG TABS tablet TAKE 1 TABLET BY MOUTH TWICE A DAY 180 tablet 1   ferrous sulfate 324 MG TBEC Take 324 mg by mouth.     FIBER THERAPY 500 MG TABS Take 500 mg by mouth 2 (two) times daily.     finasteride (PROSCAR) 5 MG tablet TAKE 1 TABLET (5 MG TOTAL) BY MOUTH DAILY. 90 tablet 1   gabapentin (NEURONTIN) 300  MG capsule Take 300 mg by mouth 4 (four) times daily.     levothyroxine (SYNTHROID, LEVOTHROID) 25 MCG tablet Take 25 mcg by mouth daily before breakfast.     magnesium oxide (MAG-OX) 400 MG tablet Take 400 mg by mouth 2 (two) times daily.      pantoprazole (PROTONIX) 40 MG tablet Take 40 mg by mouth daily.     rosuvastatin (CRESTOR) 5 MG tablet Take 5 mg by mouth daily.      sodium chloride (OCEAN) 0.65 % SOLN nasal spray Place 1 spray into both nostrils at bedtime.      tamsulosin (FLOMAX) 0.4 MG CAPS capsule TAKE 1 CAPSULE BY MOUTH EVERY DAY 90 capsule 3   Zinc 50 MG TABS Take 50 mg by mouth daily.  metoprolol succinate (TOPROL-XL) 25 MG 24 hr tablet Take 1 tablet (25 mg total) by mouth daily. 90 tablet 3   No current facility-administered medications for this visit.    Allergies:   Patient has no known allergies.    Social History:  The patient  reports that he has never smoked. He has never used smokeless tobacco. He reports that he does not drink alcohol and does not use drugs.   Family History:  The patient's family history includes Coronary artery disease in an other family member; Heart attack (age of onset: 17) in his father; Heart failure in an other family member.    ROS:  Please see the history of present illness.   Otherwise, review of systems are positive for none.   All other systems are reviewed and negative.    PHYSICAL EXAM: VS:  BP 112/64 (BP Location: Left Arm, Patient Position: Sitting, Cuff Size: Normal)   Pulse 61   Ht 5' 10.5" (1.791 m)   Wt 205 lb 6 oz (93.2 kg)   SpO2 99%   BMI 29.05 kg/m  , BMI Body mass index is 29.05 kg/m. GEN: Well nourished, well developed, in no acute distress  HEENT: normal  Neck: no JVD, carotid bruits, or masses Cardiac: Irregularly irregular; no murmurs, rubs, or gallops,no edema  Respiratory:  clear to auscultation bilaterally, normal work of breathing GI: soft, nontender, nondistended, + BS MS: no deformity or atrophy   Skin: warm and dry, no rash Neuro:  Strength and sensation are intact Psych: euthymic mood, full affect   EKG:  EKG is ordered today. The ekg ordered today demonstrates normal sinus rhythm with left axis deviation and nonspecific T wave changes.   Recent Labs: No results found for requested labs within last 365 days.    Lipid Panel    Component Value Date/Time   CHOL 218 10/25/2009 0000   TRIG 189 10/25/2009 0000   HDL 37.3 10/25/2009 0000   LDLCALC 142.9 10/25/2009 0000      Wt Readings from Last 3 Encounters:  09/21/22 205 lb 6 oz (93.2 kg)  03/21/22 226 lb 6 oz (102.7 kg)  10/07/21 210 lb (95.3 kg)          No data to display            ASSESSMENT AND PLAN:  1.  Paroxysmal supraventricular tachycardia and atrial flutter: Status post successful ablation.  Continue Toprol.  He reports increased palpitations at night but he is in sinus rhythm today.  Not able to increase Toprol due to baseline bradycardia.  Suspect that stress is contributing.  If his symptoms worsen, he will let us know and we will obtain a 2-week ZIO monitor on him.  Will refill Toprol.  2. Frequent PVCs:  No evidence of significant PVCs on metoprolol.   3. History of nonischemic cardiomyopathy: Most recent echo in 2021 showed an EF of 50 to 55% which is improved from before.  However, he now reports worsening dyspnea and fatigue.  I requested a repeat echocardiogram.  4.  Persistent atrial fibrillation:  He is maintaining in sinus rhythm and tolerating anticoagulation with Eliquis.  Most recent labs showed a creatinine of 1.1.  Continue Eliquis 5 mg twice daily.   5.  Hyperlipidemia: I reviewed most recent lipid profile done in [name] which showed an LDL of 88.  Continue rosuvastatin 5 mg once daily.   Disposition: Follow-up in 6 months.  Signed,  Kathlyn Sacramento, MD  09/21/2022 5:28 PM  [facility]

## 2022-10-01 ENCOUNTER — Other Ambulatory Visit: Payer: Self-pay | Admitting: Cardiovascular Disease

## 2022-10-01 DIAGNOSIS — I480 Paroxysmal atrial fibrillation: Secondary | ICD-10-CM

## 2022-10-02 NOTE — Telephone Encounter (Signed)
Refill request

## 2022-10-02 NOTE — Telephone Encounter (Signed)
Eliquis 5mg  refill request received. Patient is 82 years old, weight-93.2kg, Crea-1.1 on 09/04/2022 via [facility] from [place], [place], and last seen by Dr. [place] on 09/21/2022. Dose is appropriate based on dosing criteria. Will send in refill to requested pharmacy.

## 2022-10-04 ENCOUNTER — Ambulatory Visit: Payer: Medicare Other | Attending: Cardiovascular Disease

## 2022-10-04 DIAGNOSIS — R0609 Other forms of dyspnea: Secondary | ICD-10-CM | POA: Diagnosis not present

## 2022-10-04 LAB — ECHOCARDIOGRAM COMPLETE
AR max vel: 2.27 cm2
AV Peak grad: 7.3 mmHg
Ao pk vel: 1.35 m/s
Area-P 1/2: 1.96 cm2
Calc EF: 60.5 %
MV M vel: 3.42 m/s
MV Peak grad: 46.7 mmHg
P 1/2 time: 2733 msec
S' Lateral: 4.1 cm
Single Plane A2C EF: 59.1 %
Single Plane A4C EF: 63.8 %

## 2022-10-09 ENCOUNTER — Ambulatory Visit: Payer: Medicare Other | Admitting: Urology

## 2022-10-11 ENCOUNTER — Ambulatory Visit (INDEPENDENT_AMBULATORY_CARE_PROVIDER_SITE_OTHER): Payer: Medicare Other | Admitting: Urology

## 2022-10-11 ENCOUNTER — Encounter: Payer: Self-pay | Admitting: Urology

## 2022-10-11 VITALS — BP 104/71 | HR 88 | Ht 70.5 in | Wt 211.0 lb

## 2022-10-11 DIAGNOSIS — N401 Enlarged prostate with lower urinary tract symptoms: Secondary | ICD-10-CM

## 2022-10-11 LAB — BLADDER SCAN AMB NON-IMAGING: Scan Result: 27

## 2022-10-11 MED ORDER — TROSPIUM CHLORIDE ER 60 MG PO CP24: 60.0000 mg | ORAL_CAPSULE | Freq: Every day | ORAL | 0 refills | Status: DC

## 2022-10-11 NOTE — Progress Notes (Signed)
10/11/2022 2:11 PM   Vincent [name] [birth year] [id number]  Referring provider: Tracie Harrier, MD [address] [facility] [place],  [place] [address]  Chief Complaint  Patient presents with   Benign Prostatic Hypertrophy    Urologic history: 1.  BPH with lower urinary tract symptoms -Finasteride 5 mg daily -Tamsulosin added last year for increased urgency/nocturia   2.  Elevated PSA -Prostate biopsy early 2000 PSA 6.1; benign -Rebiopsy 2006; PSA 6.6; benign -Started on dutasteride with baseline PSA low-mid 2 range. -Prostate cancer screening discontinued 2019  HPI: 81 y.o. male presents for annual follow-up.  Bothersome storage related voiding symptoms for the last 2 years.  No improvement with either Myrbetriq or Gemtesa Denies dysuria, gross hematuria No flank, abdominal or pelvic pain Remains on tamsulosin and finasteride   PMH: Past Medical History:  Diagnosis Date   Arthritis    Dizziness and giddiness    Dysrhythmia    GERD (gastroesophageal reflux disease)    Gout    History of kidney stones    History of NICM    a. Prev EF as low as 35-40%;  b. 03/2012 Echo: EF 50-55%, Gr 1 DD, mild MR;  c. 06/2014 Echo: EF 50-55%, gr 1 DD, mild MR, mildly dil LA; d. 11/2019 Echo: EF 50-55%.   History of systolic CHF    a. Prev EF as low as 35-40%;  b. 03/2012 Echo: EF 50-55%, Gr 1 DD, mild MR;  c. 06/2014 Echo: EF 50-55%, gr 1 DD; d. 11/2019 Echo: EF 50-55%, no rwma, mildly dil LA, nl RV fxn, triv MR/PR.   Hypothyroidism    Non-obstructive CAD    a. 2009 nonobs dzs, LAD 20, EF 40-45%;  b. 07/2012 Ex MV: EF 55%, freq pvc's, no ischemia/infarct.   PAF (paroxysmal atrial fibrillation) ([place])    a. 11/2013 - occurred 3 wks post-op Appe in setting of pelvic abscess-->amio-->converted but amio later d/c'd 2/2 hypothyroidism; b. 01/2020 recurrent rate-controlled Afib-->Eliquis-->s/p DCCV 02/2020.   Paroxysmal Atrial flutter ([place])    a. 12/2019 s/p RFCA.    Paroxysmal supraventricular tachycardia    a. SVT/PAT   Prostate enlargement    PSVT (paroxysmal supraventricular tachycardia)    a. 12/2019 s/p RFCA.   PVC's (premature ventricular contractions)    a. Asymptomatic - managed with Toprol and Mg.   Renal insufficiency    Sciatica of right side     Surgical History: Past Surgical History:  Procedure Laterality Date   ABSCESS DRAINAGE     APPENDECTOMY  2015   BACK SURGERY     CARDIAC CATHETERIZATION  2009   CARDIOVERSION N/A 03/15/2020   Procedure: CARDIOVERSION;  Surgeon: Wellington Hampshire, MD;  Location: [facility];  Service: Cardiovascular;  Laterality: N/A;   CATARACT EXTRACTION Right    CATARACT EXTRACTION W/PHACO Right 10/17/2017   Procedure: CATARACT EXTRACTION PHACO AND INTRAOCULAR LENS PLACEMENT ([place]) RIGHT;  Surgeon: Leandrew Koyanagi, MD;  Location: [place];  Service: Ophthalmology;  Laterality: Right;   CATARACT EXTRACTION W/PHACO Left 12/24/2017   Procedure: CATARACT EXTRACTION PHACO AND INTRAOCULAR LENS PLACEMENT ([place]) LEFT;  Surgeon: Leandrew Koyanagi, MD;  Location: [place];  Service: Ophthalmology;  Laterality: Left;   COLONOSCOPY     COLONOSCOPY WITH PROPOFOL N/A 11/06/2018   Procedure: COLONOSCOPY WITH PROPOFOL;  Surgeon: Toledo, Benay Pike, MD;  Location: [facility];  Service: Gastroenterology;  Laterality: N/A;   COLONOSCOPY WITH PROPOFOL N/A 07/05/2021   Procedure: COLONOSCOPY WITH PROPOFOL;  Surgeon: Andrey Farmer  T, MD;  Location: [facility];  Service: Endoscopy;  Laterality: N/A;  REQUEST AM   DUPUYTREN / PALMAR FASCIOTOMY Bilateral    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   Procedure: DUPUYTREN CONTRACTURE RELEASE;  Surgeon: Earnestine Leys, MD;  Location: [facility];  Service: Orthopedics;  Laterality: Left;  left index and fourth finger   ESOPHAGOGASTRODUODENOSCOPY (EGD) WITH PROPOFOL N/A 07/05/2021   Procedure: ESOPHAGOGASTRODUODENOSCOPY (EGD) WITH PROPOFOL;  Surgeon:  Lesly Rubenstein, MD;  Location: [facility];  Service: Endoscopy;  Laterality: N/A;   HAND SURGERY  1999   JOINT REPLACEMENT Right 2012   TKR   PARTIAL KNEE ARTHROPLASTY Left    SVT ABLATION N/A 12/29/2019   Procedure: SVT ABLATION;  Surgeon: Evans Lance, MD;  Location: [place] [facility];  Service: Cardiovascular;  Laterality: N/A;   TOTAL KNEE ARTHROPLASTY Right 06/17/2017    Home Medications:  Allergies as of 10/11/2022   No Known Allergies      Medication List        Accurate as of October 11, 2022  2:11 PM. If you have any questions, ask your nurse or doctor.          STOP taking these medications    cetirizine 10 MG tablet Commonly known as: ZYRTEC Stopped by: Abbie Sons, MD   ferrous sulfate 324 MG Tbec Stopped by: Abbie Sons, MD   gabapentin 300 MG capsule Commonly known as: NEURONTIN Stopped by: Abbie Sons, MD       TAKE these medications    acetaminophen 500 MG tablet Commonly known as: TYLENOL Take 1,000 mg by mouth every 6 (six) hours as needed for moderate pain or headache.   amoxicillin 500 MG tablet Commonly known as: AMOXIL Take 2,000 mg by mouth See admin instructions. Take 4 capsules (2000 mg) by mouth 1 hour prior to dental procedures.   Biotin 5000 MCG Tabs Take 5,000 mcg by mouth daily.   Eliquis 5 MG Tabs tablet Generic drug: apixaban TAKE 1 TABLET BY MOUTH TWICE A DAY   Fiber Therapy 500 MG Tabs Generic drug: Methylcellulose (Laxative) Take 500 mg by mouth 2 (two) times daily.   finasteride 5 MG tablet Commonly known as: PROSCAR TAKE 1 TABLET (5 MG TOTAL) BY MOUTH DAILY.   levothyroxine 25 MCG tablet Commonly known as: SYNTHROID Take 25 mcg by mouth daily before breakfast.   LUBRICATING EYE DROPS OP Apply 1 drop to eye 2 (two) times daily as needed (dry eyes).   magnesium oxide 400 MG tablet Commonly known as: MAG-OX Take 400 mg by mouth 2 (two) times daily.   metoprolol succinate 25 MG 24 hr  tablet Commonly known as: TOPROL-XL Take 1 tablet (25 mg total) by mouth daily.   pantoprazole 40 MG tablet Commonly known as: PROTONIX Take 40 mg by mouth daily.   rosuvastatin 5 MG tablet Commonly known as: CRESTOR Take 5 mg by mouth daily.   sodium chloride 0.65 % Soln nasal spray Commonly known as: OCEAN Place 1 spray into both nostrils at bedtime.   tamsulosin 0.4 MG Caps capsule Commonly known as: FLOMAX TAKE 1 CAPSULE BY MOUTH EVERY DAY   Vitamin B 12 500 MCG Tabs Take by mouth.   Zinc 50 MG Tabs Take 50 mg by mouth daily.        Allergies: No Known Allergies  Family History: Family History  Problem Relation Age of Onset   Heart attack Father 21   Coronary artery disease Other  family hx   Heart failure Other        family hx - CHF    Social History:  reports that he has never smoked. He has never used smokeless tobacco. He reports that he does not drink alcohol and does not use drugs.   Physical Exam: BP 104/71   Pulse 88   Ht 5' 10.5" (1.791 m)   Wt 211 lb (95.7 kg)   BMI 29.85 kg/m   Constitutional:  Alert and oriented, No acute distress. HEENT: [place] AT, moist mucus membranes.  Trachea midline, no masses. Cardiovascular: No clubbing, cyanosis, or edema. Respiratory: Normal respiratory effort, no increased work of breathing. Psychiatric: Normal mood and affect.   Assessment & Plan:    1.  BPH with LUTS Bladder scan PVR 27 mL Bothersome storage related voiding symptoms-refractory to beta 3 agonist We discussed anticholinergic medication trial.  We discussed increased side effects of dry mouth and constipation.  Based on age would recommend trospium and Rx extended release trospium 60 mg sent to pharmacy We discussed small chance of urinary retention and if he notes worsening voiding symptoms to call Call back 1 month regarding efficacy PTNS was also discussed Continue annual follow-up   [name], MD  [facility] [address] [place], [place] [address] [phone]

## 2022-10-14 ENCOUNTER — Other Ambulatory Visit: Payer: Self-pay | Admitting: Urology

## 2022-10-28 ENCOUNTER — Other Ambulatory Visit: Payer: Self-pay | Admitting: Urology

## 2022-10-30 ENCOUNTER — Telehealth: Payer: Self-pay | Admitting: Cardiovascular Disease

## 2022-10-30 ENCOUNTER — Other Ambulatory Visit: Payer: Self-pay

## 2022-10-30 DIAGNOSIS — I480 Paroxysmal atrial fibrillation: Secondary | ICD-10-CM

## 2022-10-30 MED ORDER — METOPROLOL SUCCINATE ER 25 MG PO TB24: 25.0000 mg | ORAL_TABLET | Freq: Every day | ORAL | 3 refills | Status: DC

## 2022-10-30 NOTE — Telephone Encounter (Signed)
Disp Refills Start End   metoprolol succinate (TOPROL-XL) 25 MG 24 hr tablet 90 tablet 3 10/30/2022    Sig - Route: Take 1 tablet (25 mg total) by mouth daily. - Oral   Sent to pharmacy as: metoprolol succinate (TOPROL-XL) 25 MG 24 hr tablet   E-Prescribing Status: Receipt confirmed by pharmacy (10/30/2022  9:32 AM EST)    Associated Diagnoses  PAF (paroxysmal atrial fibrillation) [facility])     Pharmacy  [id number] [place], [place] - [address]

## 2022-10-30 NOTE — Telephone Encounter (Signed)
*  STAT* If patient is at the pharmacy, call can be transferred to refill team.   1. Which medications need to be refilled? (please list name of each medication and dose if known)  metoprolol succinate (TOPROL-XL) 25 MG 24 hr tablet  2. Which pharmacy/location (including street and city if local pharmacy) is medication to be sent to? [id number] [place], [place] - [address]   3. Do they need a 30 day or 90 day supply?  90 day supply

## 2022-11-04 ENCOUNTER — Other Ambulatory Visit: Payer: Self-pay | Admitting: Urology

## 2022-12-28 ENCOUNTER — Telehealth: Payer: Self-pay | Admitting: Cardiovascular Disease

## 2022-12-28 NOTE — Telephone Encounter (Signed)
Pt c/o medication issue:  1. Name of Medication: ELIQUIS 5 MG TABS tablet   2. How are you currently taking this medication (dosage and times per day)?   3. Are you having a reaction (difficulty breathing--STAT)?   4. What is your medication issue? Pt is requesting call back to see if he can get his [facility] discount for this medication renewed. He said that it is now expired.

## 2022-12-28 NOTE — Telephone Encounter (Signed)
Pt c/o medication issue:  1. Name of Medication: Eliquis  2. How are you currently taking this medication (dosage and times per day)?   3. Are you having a reaction (difficulty breathing--STAT)?   4. What is your medication issue? Patient said when he went to use his discount card for Eliquis, it did not work. He says he have always been able to use it, but not this time

## 2022-12-28 NOTE — Telephone Encounter (Signed)
Co-pay card placed up front for pick up. Pt made aware.

## 2022-12-29 NOTE — Telephone Encounter (Signed)
Left a message for the patient to call back.  

## 2022-12-29 NOTE — Telephone Encounter (Signed)
Follow Up:    Patient is  returning Lisa's call from today.

## 2022-12-29 NOTE — Telephone Encounter (Signed)
The patient stated he tried to use the $10 coupon for Eliquis but cannot due to not having commercial insurance. He has been advised that he can try for assistance but would need to spend 3% of his income. He stated that he did not want to try for assistance at this time.   He wanted to know if there was another medication that he could try and has been advised that the only other options are Xarelto (which may not be cheaper) and Warfarin.   He has a month supply right now. He stated he will call back if he changes his mind about the assistance.

## 2023-03-22 ENCOUNTER — Ambulatory Visit: Payer: Medicare Other | Attending: Cardiovascular Disease | Admitting: Cardiovascular Disease

## 2023-03-22 ENCOUNTER — Encounter: Payer: Self-pay | Admitting: Cardiovascular Disease

## 2023-03-22 VITALS — BP 124/60 | HR 53 | Ht 70.5 in | Wt 218.2 lb

## 2023-03-22 DIAGNOSIS — I493 Ventricular premature depolarization: Secondary | ICD-10-CM | POA: Diagnosis not present

## 2023-03-22 DIAGNOSIS — I471 Supraventricular tachycardia, unspecified: Secondary | ICD-10-CM | POA: Diagnosis not present

## 2023-03-22 DIAGNOSIS — E785 Hyperlipidemia, unspecified: Secondary | ICD-10-CM

## 2023-03-22 DIAGNOSIS — I4819 Other persistent atrial fibrillation: Secondary | ICD-10-CM | POA: Diagnosis not present

## 2023-03-22 NOTE — Patient Instructions (Signed)
Medication Instructions:  No changes *If you need a refill on your cardiac medications before your next appointment, please call your pharmacy*   Lab Work: None ordered If you have labs (blood work) drawn today and your tests are completely normal, you will receive your results only by: MyChart Message (if you have MyChart) OR A paper copy in the mail If you have any lab test that is abnormal or we need to change your treatment, we will call you to review the results.   Testing/Procedures: None ordered   Follow-Up: At [facility], you and your health needs are our priority.  As part of our continuing mission to provide you with exceptional heart care, we have created designated Provider Care Teams.  These Care Teams include your primary Cardiologist (physician) and Advanced Practice Providers (APPs -  Physician Assistants and Nurse Practitioners) who all work together to provide you with the care you need, when you need it.  We recommend signing up for the patient portal called "MyChart".  Sign up information is provided on this After Visit Summary.  MyChart is used to connect with patients for Virtual Visits (Telemedicine).  Patients are able to view lab/test results, encounter notes, upcoming appointments, etc.  Non-urgent messages can be sent to your provider as well.   To learn more about what you can do with MyChart, go to [email or web address].    Your next appointment:   6 month(s)  Provider:   You may see Muhammad Arida, MD or one of the following Advanced Practice Providers on your designated Care Team:   Christopher Berge, NP Ryan Dunn, PA-C Cadence Furth, PA-C Sheri Hammock, NP    

## 2023-03-22 NOTE — Progress Notes (Signed)
Cardiology Office Note   Date:  03/22/2023   ID:  Vincent [name] May 10, 1941, MRN [id number]  PCP:  Vincent Reichmann, MD  Cardiologist:   [name], MD   Chief Complaint  Patient presents with   Follow-up    6 month f/u no complaints today. Meds reviewed verbally with pt.      History of Present Illness: Vincent [name] is a 82 y.o. male who presents for a follow up regarding supraventricular tachycardia, mild cardiomyopathy, paroxysmal atrial fibrillation, atrial flutter and ventricular bigeminy.  Cardiac catheterization in 2009 showed an ejection fraction of 40-45% range with a mild nonobstructive 20% plaque in his LAD.   Most recent echocardiogram in January 2021 showed an EF of 50 to 55% with no significant valvular abnormalities. The patient had symptomatic SVT and atrial flutter status post ablation by Dr. Ladona Ridgel in February 2021.  He was found to be in atrial fibrillation after ablation and was subsequently started on anticoagulation and underwent cardioversion no recurrent arrhythmia since then.  Most recent ischemic cardiac evaluation in May 2021 included a [facility] which showed no evidence of ischemia with normal ejection fraction.  He has been doing well overall with no chest pain or shortness of breath.  He has intermittent palpitations and tachycardia but these episodes do not last more than few minutes.  He continues to be under stress due to illness of his wife.  Past Medical History:  Diagnosis Date   Arthritis    Dizziness and giddiness    Dysrhythmia    GERD (gastroesophageal reflux disease)    Gout    History of kidney stones    History of NICM    a. Prev EF as low as 35-40%;  b. 03/2012 Echo: EF 50-55%, Gr 1 DD, mild MR;  c. 06/2014 Echo: EF 50-55%, gr 1 DD, mild MR, mildly dil LA; d. 11/2019 Echo: EF 50-55%.   History of systolic CHF    a. Prev EF as low as 35-40%;  b. 03/2012 Echo: EF 50-55%, Gr 1 DD, mild MR;  c. 06/2014 Echo: EF  50-55%, gr 1 DD; d. 11/2019 Echo: EF 50-55%, no rwma, mildly dil LA, nl RV fxn, triv MR/PR.   Hypothyroidism    Non-obstructive CAD    a. 2009 nonobs dzs, LAD 20, EF 40-45%;  b. 07/2012 Ex MV: EF 55%, freq pvc's, no ischemia/infarct.   PAF (paroxysmal atrial fibrillation) (HCC)    a. 11/2013 - occurred 3 wks post-op Appe in setting of pelvic abscess-->amio-->converted but amio later d/c'd 2/2 hypothyroidism; b. 01/2020 recurrent rate-controlled Afib-->Eliquis-->s/p DCCV 02/2020.   Paroxysmal Atrial flutter (HCC)    a. 12/2019 s/p RFCA.   Paroxysmal supraventricular tachycardia    a. SVT/PAT   Prostate enlargement    PSVT (paroxysmal supraventricular tachycardia)    a. 12/2019 s/p RFCA.   PVC's (premature ventricular contractions)    a. Asymptomatic - managed with Toprol and Mg.   Renal insufficiency    Sciatica of right side     Past Surgical History:  Procedure Laterality Date   ABSCESS DRAINAGE     APPENDECTOMY  2015   BACK SURGERY     CARDIAC CATHETERIZATION  2009   CARDIOVERSION N/A 03/15/2020   Procedure: CARDIOVERSION;  Surgeon: Iran Ouch, MD;  Location: [facility] OR[facility];  Service: Cardiovascular;  Laterality: N/A;   CATARACT EXTRACTION Right    CATARACT EXTRACTION W/PHACO Right 10/17/2017   Procedure: CATARACT EXTRACTION PHACO AND INTRAOCULAR LENS PLACEMENT (IOC)  RIGHT;  Surgeon: [name], MD;  Location: [facility];  Service: Ophthalmology;  Laterality: Right;   CATARACT EXTRACTION W/PHACO Left 12/24/2017   Procedure: CATARACT EXTRACTION PHACO AND INTRAOCULAR LENS PLACEMENT (IOC) LEFT;  Surgeon: [name], MD;  Location: [facility];  Service: Ophthalmology;  Laterality: Left;   COLONOSCOPY     COLONOSCOPY WITH PROPOFOL N/A 11/06/2018   Procedure: COLONOSCOPY WITH PROPOFOL;  Surgeon: Toledo, Boykin Nearing, MD;  Location: [facility];  Service: Gastroenterology;  Laterality: N/A;   COLONOSCOPY WITH PROPOFOL N/A 07/05/2021   Procedure: COLONOSCOPY  WITH PROPOFOL;  Surgeon: Regis Bill, MD;  Location: [facility];  Service: Endoscopy;  Laterality: N/A;  REQUEST AM   DUPUYTREN / PALMAR FASCIOTOMY Bilateral    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   Procedure: DUPUYTREN CONTRACTURE RELEASE;  Surgeon: [name], MD;  Location: [facility];  Service: Orthopedics;  Laterality: Left;  left index and fourth finger   ESOPHAGOGASTRODUODENOSCOPY (EGD) WITH PROPOFOL N/A 07/05/2021   Procedure: ESOPHAGOGASTRODUODENOSCOPY (EGD) WITH PROPOFOL;  Surgeon: Regis Bill, MD;  Location: [facility];  Service: Endoscopy;  Laterality: N/A;   HAND SURGERY  1999   JOINT REPLACEMENT Right 2012   TKR   PARTIAL KNEE ARTHROPLASTY Left    SVT ABLATION N/A 12/29/2019   Procedure: SVT ABLATION;  Surgeon: Marinus Maw, MD;  [facility];  Service: Cardiovascular;  Laterality: N/A;   TOTAL KNEE ARTHROPLASTY Right 06/17/2017     Current Outpatient Medications  Medication Sig Dispense Refill   acetaminophen (TYLENOL) 500 MG tablet Take 1,000 mg by mouth every 6 (six) hours as needed for moderate pain or headache.      amoxicillin (AMOXIL) 500 MG tablet Take 2,000 mg by mouth See admin instructions. Take 4 capsules (2000 mg) by mouth 1 hour prior to dental procedures.     Biotin 5000 MCG TABS Take 5,000 mcg by mouth daily.     Carboxymethylcellul-Glycerin (LUBRICATING EYE DROPS OP) Apply 1 drop to eye 2 (two) times daily as needed (dry eyes).     Cyanocobalamin (VITAMIN B 12) 500 MCG TABS Take by mouth.     ELIQUIS 5 MG TABS tablet TAKE 1 TABLET BY MOUTH TWICE A DAY 180 tablet 1   FIBER THERAPY 500 MG TABS Take 500 mg by mouth 2 (two) times daily.     finasteride (PROSCAR) 5 MG tablet TAKE 1 TABLET (5 MG TOTAL) BY MOUTH DAILY. 90 tablet 1   levothyroxine (SYNTHROID, LEVOTHROID) 25 MCG tablet Take 25 mcg by mouth daily before breakfast.     magnesium oxide (MAG-OX) 400 MG tablet Take 400 mg by mouth 2 (two) times daily.       metoprolol succinate (TOPROL-XL) 25 MG 24 hr tablet Take 1 tablet (25 mg total) by mouth daily. 90 tablet 3   pantoprazole (PROTONIX) 40 MG tablet Take 40 mg by mouth daily.     rosuvastatin (CRESTOR) 5 MG tablet Take 5 mg by mouth daily.      sodium chloride (OCEAN) 0.65 % SOLN nasal spray Place 1 spray into both nostrils at bedtime.      tamsulosin (FLOMAX) 0.4 MG CAPS capsule TAKE 1 CAPSULE BY MOUTH EVERY DAY 90 capsule 3   Trospium Chloride 60 MG CP24 TAKE 1 CAPSULE BY MOUTH EVERY DAY 90 capsule 1   Zinc 50 MG TABS Take 50 mg by mouth daily.     No current facility-administered medications for this visit.    Allergies:  Patient has no known allergies.    Social History:  The patient  reports that he has never smoked. He has never used smokeless tobacco. He reports that he does not drink alcohol and does not use drugs.   Family History:  The patient's family history includes Coronary artery disease in an other family member; Heart attack (age of onset: 61) in his father; Heart failure in an other family member.    ROS:  Please see the history of present illness.   Otherwise, review of systems are positive for none.   All other systems are reviewed and negative.    PHYSICAL EXAM: VS:  BP 124/60 (BP Location: Left Arm, Patient Position: Sitting, Cuff Size: Normal)   Pulse (!) 53   Ht 5' 10.5" (1.791 m)   Wt 218 lb 4 oz (99 kg)   SpO2 98%   BMI 30.87 kg/m  , BMI Body mass index is 30.87 kg/m. GEN: Well nourished, well developed, in no acute distress  HEENT: normal  Neck: no JVD, carotid bruits, or masses Cardiac: Regular rate and rhythm; no murmurs, rubs, or gallops,no edema  Respiratory:  clear to auscultation bilaterally, normal work of breathing GI: soft, nontender, nondistended, + BS MS: no deformity or atrophy  Skin: warm and dry, no rash Neuro:  Strength and sensation are intact Psych: euthymic mood, full affect   EKG:  EKG is ordered today. The ekg ordered today  demonstrates sinus bradycardia with nonspecific ST changes.   Recent Labs: No results found for requested labs within last 365 days.    Lipid Panel    Component Value Date/Time   CHOL 218 10/25/2009 0000   TRIG 189 10/25/2009 0000   HDL 37.3 10/25/2009 0000   LDLCALC 142.9 10/25/2009 0000      Wt Readings from Last 3 Encounters:  03/22/23 218 lb 4 oz (99 kg)  10/11/22 211 lb (95.7 kg)  09/21/22 205 lb 6 oz (93.2 kg)          No data to display            ASSESSMENT AND PLAN:  1.  Paroxysmal supraventricular tachycardia and atrial flutter: Status post successful ablation.  Continue Toprol.    2. Frequent PVCs:  No evidence of significant PVCs on metoprolol.   3. History of nonischemic cardiomyopathy: Most recent echocardiogram in November 2023 showed improvement in LV systolic function with an EF of 55 to 60%.  4.  Persistent atrial fibrillation:  He is maintaining in sinus rhythm and tolerating anticoagulation with Eliquis.  Most recent labs showed a creatinine of 1.1.  Continue Eliquis 5 mg twice daily.   5.  Hyperlipidemia: I reviewed most recent lipid profile done in October which showed an LDL of 87.  Continue rosuvastatin.   Disposition: Follow-up in 6 months.  Signed,  [name], MD  03/22/2023 1:27 PM    [facility]

## 2023-04-14 ENCOUNTER — Other Ambulatory Visit: Payer: Self-pay | Admitting: Urology

## 2023-04-21 ENCOUNTER — Other Ambulatory Visit: Payer: Self-pay | Admitting: Cardiovascular Disease

## 2023-04-21 DIAGNOSIS — I480 Paroxysmal atrial fibrillation: Secondary | ICD-10-CM

## 2023-04-23 NOTE — Telephone Encounter (Signed)
Refill request

## 2023-04-23 NOTE — Telephone Encounter (Signed)
Prescription refill request for Eliquis received. Indication: AF Last office visit: 03/22/23  Terrilee Files MD Scr: 1.1 on 09/04/22 Age: 82 Weight: 99kg  Based on above findings Eliquis 5mg  twice daily is the appropriate dose.  Refill approved.

## 2023-05-18 ENCOUNTER — Other Ambulatory Visit: Payer: Self-pay | Admitting: Urology

## 2023-08-17 ENCOUNTER — Other Ambulatory Visit: Payer: Self-pay | Admitting: Urology

## 2023-08-17 ENCOUNTER — Other Ambulatory Visit: Payer: Self-pay | Admitting: Cardiovascular Disease

## 2023-08-17 DIAGNOSIS — I480 Paroxysmal atrial fibrillation: Secondary | ICD-10-CM

## 2023-10-15 ENCOUNTER — Encounter: Payer: Self-pay | Admitting: Urology

## 2023-10-15 ENCOUNTER — Ambulatory Visit: Payer: Medicare Other | Admitting: Urology

## 2023-10-15 VITALS — BP 111/70 | HR 139 | Ht 68.0 in | Wt 212.0 lb

## 2023-10-15 DIAGNOSIS — N401 Enlarged prostate with lower urinary tract symptoms: Secondary | ICD-10-CM | POA: Diagnosis not present

## 2023-10-15 LAB — BLADDER SCAN AMB NON-IMAGING: PVR: 0 WU

## 2023-10-15 MED ORDER — TAMSULOSIN HCL 0.4 MG PO CAPS: 0.4000 mg | ORAL_CAPSULE | Freq: Every day | ORAL | 3 refills | Status: AC

## 2023-10-15 NOTE — Progress Notes (Signed)
I, Vincent [name], acting as a scribe for Vincent Altes, MD., have documented all relevant documentation on the behalf of Vincent Altes, MD, as directed by Vincent Altes, MD while in the presence of Vincent Altes, MD.  10/15/2023 12:05 PM   Joycie Peek [name] [birth year] [id number]  Referring provider: [name], MD [address] [facility] [address],  [place] [address]  Chief Complaint  Patient presents with   Benign Prostatic Hypertrophy   Urologic history: 1.  BPH with lower urinary tract symptoms Finasteride 5 mg daily Tamsulosin added for increased urgency/nocturia   2.  Elevated PSA Prostate biopsy early 2000 PSA 6.1; benign Rebiopsy 2006; PSA 6.6; benign Started on dutasteride with baseline PSA low-mid 2 range. Prostate cancer screening discontinued 2019  HPI: Vincent [name] is a 82 y.o. male presents for annual follow-up.   At last year's visit, he had continued to have bothersome storage-related voiding symptoms without improvement on Myrbetriq or Gemtesa. He was given a trial of extended-release trospium which he initially thought was effective, but now he is having frequency and urgency. Also complaining of constipation.  Denies dysuria, gross hematuria No flank, abdominal or pelvic pain   PMH: Past Medical History:  Diagnosis Date   Arthritis    Dizziness and giddiness    Dysrhythmia    GERD (gastroesophageal reflux disease)    Gout    History of kidney stones    History of NICM    a. Prev EF as low as 35-40%;  b. 03/2012 Echo: EF 50-55%, Gr 1 DD, mild MR;  c. 06/2014 Echo: EF 50-55%, gr 1 DD, mild MR, mildly dil LA; d. 11/2019 Echo: EF 50-55%.   History of systolic CHF    a. Prev EF as low as 35-40%;  b. 03/2012 Echo: EF 50-55%, Gr 1 DD, mild MR;  c. 06/2014 Echo: EF 50-55%, gr 1 DD; d. 11/2019 Echo: EF 50-55%, no rwma, mildly dil LA, nl RV fxn, triv MR/PR.   Hypothyroidism    Non-obstructive CAD    a. 2009 nonobs dzs,  LAD 20, EF 40-45%;  b. 07/2012 Ex MV: EF 55%, freq pvc's, no ischemia/infarct.   PAF (paroxysmal atrial fibrillation) (HCC)    a. 11/2013 - occurred 3 wks post-op Appe in setting of pelvic abscess-->amio-->converted but amio later d/c'd 2/2 hypothyroidism; b. 01/2020 recurrent rate-controlled Afib-->Eliquis-->s/p DCCV 02/2020.   Paroxysmal Atrial flutter (HCC)    a. 12/2019 s/p RFCA.   Paroxysmal supraventricular tachycardia (HCC)    a. SVT/PAT   Prostate enlargement    PSVT (paroxysmal supraventricular tachycardia) (HCC)    a. 12/2019 s/p RFCA.   PVC's (premature ventricular contractions)    a. Asymptomatic - managed with Toprol and Mg.   Renal insufficiency    Sciatica of right side     Surgical History: Past Surgical History:  Procedure Laterality Date   ABSCESS DRAINAGE     APPENDECTOMY  2015   BACK SURGERY     CARDIAC CATHETERIZATION  2009   CARDIOVERSION N/A 03/15/2020   Procedure: CARDIOVERSION;  Surgeon: Iran Ouch, MD;  Location: [facility];  Service: Cardiovascular;  Laterality: N/A;   CATARACT EXTRACTION Right    CATARACT EXTRACTION W/PHACO Right 10/17/2017   Procedure: CATARACT EXTRACTION PHACO AND INTRAOCULAR LENS PLACEMENT (IOC) RIGHT;  Surgeon: [name], MD;  Location: [facility];  Service: Ophthalmology;  Laterality: Right;   CATARACT EXTRACTION W/PHACO Left 12/24/2017   Procedure: CATARACT EXTRACTION PHACO AND  INTRAOCULAR LENS PLACEMENT (IOC) LEFT;  Surgeon: [name], MD;  Location: [facility];  Service: Ophthalmology;  Laterality: Left;   COLONOSCOPY     COLONOSCOPY WITH PROPOFOL N/A 11/06/2018   Procedure: COLONOSCOPY WITH PROPOFOL;  Surgeon: Toledo, Boykin Nearing, MD;  Location: [facility];  Service: Gastroenterology;  Laterality: N/A;   COLONOSCOPY WITH PROPOFOL N/A 07/05/2021   Procedure: COLONOSCOPY WITH PROPOFOL;  Surgeon: Regis Bill, MD;  Location: [facility];  Service: Endoscopy;  Laterality: N/A;  REQUEST  AM   DUPUYTREN / PALMAR FASCIOTOMY Bilateral    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   Procedure: DUPUYTREN CONTRACTURE RELEASE;  Surgeon: [name], MD;  Location: [facility];  Service: Orthopedics;  Laterality: Left;  left index and fourth finger   ESOPHAGOGASTRODUODENOSCOPY (EGD) WITH PROPOFOL N/A 07/05/2021   Procedure: ESOPHAGOGASTRODUODENOSCOPY (EGD) WITH PROPOFOL;  Surgeon: Regis Bill, MD;  Location: [facility];  Service: Endoscopy;  Laterality: N/A;   HAND SURGERY  1999   JOINT REPLACEMENT Right 2012   TKR   PARTIAL KNEE ARTHROPLASTY Left    SVT ABLATION N/A 12/29/2019   Procedure: SVT ABLATION;  Surgeon: Marinus Maw, MD;  [facility];  Service: Cardiovascular;  Laterality: N/A;   TOTAL KNEE ARTHROPLASTY Right 06/17/2017    Home Medications:  Allergies as of 10/15/2023   No Known Allergies      Medication List        Accurate as of October 15, 2023 12:05 PM. If you have any questions, ask your nurse or doctor.          STOP taking these medications    Vitamin B 12 500 MCG Tabs Stopped by: Vincent [name]       TAKE these medications    acetaminophen 500 MG tablet Commonly known as: TYLENOL Take 1,000 mg by mouth every 6 (six) hours as needed for moderate pain or headache.   amoxicillin 500 MG tablet Commonly known as: AMOXIL Take 2,000 mg by mouth See admin instructions. Take 4 capsules (2000 mg) by mouth 1 hour prior to dental procedures.   Biotin 5000 MCG Tabs Take 5,000 mcg by mouth daily.   Eliquis 5 MG Tabs tablet Generic drug: apixaban TAKE 1 TABLET BY MOUTH TWICE A DAY   Fiber Therapy 500 MG Tabs Generic drug: Methylcellulose (Laxative) Take 500 mg by mouth 2 (two) times daily.   finasteride 5 MG tablet Commonly known as: PROSCAR TAKE 1 TABLET (5 MG TOTAL) BY MOUTH DAILY.   levothyroxine 25 MCG tablet Commonly known as: SYNTHROID Take 25 mcg by mouth daily before breakfast.   LUBRICATING EYE  DROPS OP Apply 1 drop to eye 2 (two) times daily as needed (dry eyes).   magnesium oxide 400 MG tablet Commonly known as: MAG-OX Take 400 mg by mouth 2 (two) times daily.   metoprolol succinate 25 MG 24 hr tablet Commonly known as: TOPROL-XL TAKE 1 TABLET (25 MG TOTAL) BY MOUTH DAILY.   pantoprazole 40 MG tablet Commonly known as: PROTONIX Take 40 mg by mouth daily.   rosuvastatin 5 MG tablet Commonly known as: CRESTOR Take 5 mg by mouth daily.   sodium chloride 0.65 % Soln nasal spray Commonly known as: OCEAN Place 1 spray into both nostrils at bedtime.   tamsulosin 0.4 MG Caps capsule Commonly known as: FLOMAX Take 1 capsule (0.4 mg total) by mouth daily.   Trospium Chloride 60 MG Cp24 TAKE 1 CAPSULE BY MOUTH EVERY DAY   Zinc 50  MG Tabs Take 50 mg by mouth daily.        Allergies: No Known Allergies  Family History: Family History  Problem Relation Age of Onset   Heart attack Father 77   Coronary artery disease Other        family hx   Heart failure Other        family hx - CHF    Social History:  reports that he has never smoked. He has never used smokeless tobacco. He reports that he does not drink alcohol and does not use drugs.   Physical Exam: BP 111/70   Pulse (!) 139   Ht 5\' 8"  (1.727 m)   Wt 212 lb (96.2 kg)   BMI 32.23 kg/m   Constitutional:  Alert and oriented, No acute distress. HEENT: [place] AT Respiratory: Normal respiratory effort, no increased work of breathing. Psychiatric: Normal mood and affect.   Assessment & Plan:    1. BPH with LUTS PVR today 0 mL  He is planning on stopping the trospium once he completes his current course. We did discuss trospium is a common cause of constipation Again discussed PTNS and he was provided literature.  Tamsulosin was refilled. He did not need to refill the finasteride.  Continue annual follow-up.   I have reviewed the above documentation for accuracy and completeness, and I agree with the  above.   Vincent Altes, MD  [facility] [address], [address] [place], [place] [address] [phone]

## 2023-10-25 ENCOUNTER — Encounter: Payer: Self-pay | Admitting: Cardiovascular Disease

## 2023-10-26 ENCOUNTER — Telehealth: Payer: Self-pay | Admitting: Cardiovascular Disease

## 2023-10-26 DIAGNOSIS — Z01818 Encounter for other preprocedural examination: Secondary | ICD-10-CM

## 2023-10-26 NOTE — Telephone Encounter (Signed)
   Pre-operative Risk Assessment    Patient Name: Vincent [name]  DOB: Aug 23, 1941 MRN: [id number]      Request for Surgical Clearance    Procedure:   Lt THA Anterior Hip  Date of Surgery:  Clearance 01/03/24                                 Surgeon:  Dr. Cassell Smiles Surgeon's Group or Practice Name:  [facility] Phone number:  [phone] Fax number:  [phone]   Type of Clearance Requested:   - Pharmacy:  Hold Apixaban (Eliquis) instructions   Type of Anesthesia:  Local Francee Piccolo   Additional requests/questions:    [place]   10/26/2023, 3:23 PM

## 2023-10-29 NOTE — Telephone Encounter (Signed)
   Patient Name: Vincent [name]  DOB: 01-25-1941 MRN: [id number]  Primary Cardiologist: [name], MD  Chart reviewed as part of pre-operative protocol coverage. Patient will need updated BMP and CBC in order for recommendations to be given by pharmacy for holding of Eliquis. Preop Callback team please help coordinate with patient.  Thank you, Rip Harbour, NP 10/29/2023, 9:59 AM

## 2023-10-29 NOTE — Telephone Encounter (Signed)
I left a verbal message asking if surgery scheduler can call back as our office is just needing to confirm surgery details.   Notes states just need pharmacy clearance. Though would like to confirm if needing cardiac clearance as well.

## 2023-10-29 NOTE — Telephone Encounter (Signed)
Patient with diagnosis of afib on Eliquis for anticoagulation.    Procedure:  Lt THA Anterior Hip  Date of procedure: 01/03/24   CHA2DS2-VASc Score = 3   This indicates a 3.2% annual risk of stroke. The patient's score is based upon: CHF History: 1 HTN History: 0 Diabetes History: 0 Stroke History: 0 Vascular Disease History: 0 Age Score: 2 Gender Score: 0      CrCl  Platelet count     Per office protocol, patient can hold Eliquis for ?? days prior to procedure.     **This guidance is not considered finalized until pre-operative APP has relayed final recommendations.**

## 2023-10-29 NOTE — Telephone Encounter (Signed)
Pt will need labs with a tele appt to be done a few days after the labs have done. Left message to call back to set up lab to be done, as well as the pt will need a tele a few days after labs have been done.

## 2023-10-30 NOTE — Telephone Encounter (Signed)
Pt returning nurses phone call. Please advise ?

## 2023-10-31 ENCOUNTER — Telehealth: Payer: Self-pay | Admitting: *Deleted

## 2023-10-31 NOTE — Telephone Encounter (Signed)
I s/w the pt and he has been scheduled tele preop appt 12/10/23. Med rec and consent are done.   Pt will get labs in [place] office between 1/2-1/08/2024. I will put the labs orders BMET/CBC in.

## 2023-10-31 NOTE — Telephone Encounter (Signed)
I s/w the pt and he has been scheduled tele preop appt 12/10/23. Med rec and consent are done.   Pt will get labs in [place] office between 1/2-1/08/2024. I will put the labs orders BMET/CBC in.     Patient Consent for Virtual Visit        Vincent [name] has provided verbal consent on 10/31/2023 for a virtual visit (video or telephone).   CONSENT FOR VIRTUAL VISIT FOR:  Vincent [name]  By participating in this virtual visit I agree to the following:  I hereby voluntarily request, consent and authorize [facility] and its employed or contracted physicians, physician assistants, nurse practitioners or other licensed health care professionals (the Practitioner), to provide me with telemedicine health care services (the "Services") as deemed necessary by the treating Practitioner. I acknowledge and consent to receive the Services by the Practitioner via telemedicine. I understand that the telemedicine visit will involve communicating with the Practitioner through live audiovisual communication technology and the disclosure of certain medical information by electronic transmission. I acknowledge that I have been given the opportunity to request an in-person assessment or other available alternative prior to the telemedicine visit and am voluntarily participating in the telemedicine visit.  I understand that I have the right to withhold or withdraw my consent to the use of telemedicine in the course of my care at any time, without affecting my right to future care or treatment, and that the Practitioner or I may terminate the telemedicine visit at any time. I understand that I have the right to inspect all information obtained and/or recorded in the course of the telemedicine visit and may receive copies of available information for a reasonable fee.  I understand that some of the potential risks of receiving the Services via telemedicine include:  Delay or interruption in medical  evaluation due to technological equipment failure or disruption; Information transmitted may not be sufficient (e.g. poor resolution of images) to allow for appropriate medical decision making by the Practitioner; and/or  In rare instances, security protocols could fail, causing a breach of personal health information.  Furthermore, I acknowledge that it is my responsibility to provide information about my medical history, conditions and care that is complete and accurate to the best of my ability. I acknowledge that Practitioner's advice, recommendations, and/or decision may be based on factors not within their control, such as incomplete or inaccurate data provided by me or distortions of diagnostic images or specimens that may result from electronic transmissions. I understand that the practice of medicine is not an exact science and that Practitioner makes no warranties or guarantees regarding treatment outcomes. I acknowledge that a copy of this consent can be made available to me via my patient portal [facility] [place] MyChart), or I can request a printed copy by calling the office of [facility].    I understand that my insurance will be billed for this visit.   I have read or had this consent read to me. I understand the contents of this consent, which adequately explains the benefits and risks of the Services being provided via telemedicine.  I have been provided ample opportunity to ask questions regarding this consent and the Services and have had my questions answered to my satisfaction. I give my informed consent for the services to be provided through the use of telemedicine in my medical care

## 2023-10-31 NOTE — Telephone Encounter (Signed)
Left message to call back to schedule labs to be done as well will need a tele appt

## 2023-11-05 ENCOUNTER — Telehealth: Payer: Self-pay | Admitting: Cardiovascular Disease

## 2023-11-05 NOTE — Telephone Encounter (Signed)
   Pre-operative Risk Assessment    Patient Name: Vincent [name]  DOB: 10-04-1941 MRN: [id number]      Request for Surgical Clearance    Procedure:   Left Total Hip Arthroplasty  Date of Surgery:  Clearance 01/03/24                                 Surgeon:  Odis Luster Surgeon's Group or Practice Name:  [facility] Phone number:  [phone] Fax number:  [phone]   Type of Clearance Requested:   - Pharmacy:  Hold Apixaban (Eliquis) stop 3 days prior   Type of Anesthesia:  Not Indicated   Additional requests/questions:    Queen Slough   11/05/2023, 1:13 PM

## 2023-11-09 ENCOUNTER — Telehealth: Payer: Self-pay | Admitting: Cardiovascular Disease

## 2023-11-09 NOTE — Telephone Encounter (Signed)
Returned call to pt.  He will come in to the [facility] and have his lab work done.

## 2023-11-09 NOTE — Telephone Encounter (Signed)
Duplicate request. Waiting on labs

## 2023-11-09 NOTE — Telephone Encounter (Signed)
Pt needs to speak with pre op because his appt was moved up and he has a few questions. Please advise

## 2023-11-09 NOTE — Telephone Encounter (Signed)
   Pre-operative Risk Assessment    Patient Name: Vincent [name]  DOB: 01-15-1941 MRN: [id number]      Request for Surgical Clearance    Procedure:   LT THA anterior hip  Date of Surgery:  Clearance 11-28-23                                 Surgeon:  Dr. Cassell Smiles Surgeon's Group or Practice Name:  [facility] Phone number:  [phone] Fax number:  [phone]   Type of Clearance Requested:   - Pharmacy:  Hold Apixaban (Eliquis) follow instruction   Type of Anesthesia:  Local    Additional requests/questions:    Burnett Sheng   11/09/2023, 9:09 AM

## 2023-11-12 ENCOUNTER — Encounter: Payer: Self-pay | Admitting: Cardiovascular Disease

## 2023-11-12 ENCOUNTER — Encounter: Payer: Self-pay | Admitting: Orthopedic Surgery

## 2023-11-14 ENCOUNTER — Other Ambulatory Visit: Payer: Self-pay | Admitting: Cardiovascular Disease

## 2023-11-14 DIAGNOSIS — I480 Paroxysmal atrial fibrillation: Secondary | ICD-10-CM

## 2023-11-15 NOTE — Telephone Encounter (Signed)
Prescription refill request for Eliquis received. Indication: a fib Last office visit: 03/22/23 Scr: 1.12 [facility] 11/09/23 Age: 82 Weight: 96kg

## 2023-11-15 NOTE — Telephone Encounter (Signed)
Refill request

## 2023-11-19 ENCOUNTER — Other Ambulatory Visit: Payer: Self-pay | Admitting: Urology

## 2023-11-20 ENCOUNTER — Telehealth: Payer: Self-pay | Admitting: Cardiovascular Disease

## 2023-11-20 NOTE — Telephone Encounter (Signed)
I will forward to preop APP for review. If surgery date has been moved up then pt's tele appt will need to be moved up as well. Pt will need to be added on. Will d/w preop APP.

## 2023-11-20 NOTE — Telephone Encounter (Signed)
I called pt on [phone]. Pt has moved tele appt to 11/23/23 ok per Jari Favre, PAC due to surgery date has been moved up. See previous notes.

## 2023-11-20 NOTE — Telephone Encounter (Signed)
Pt is calling for an update on his lab work and if he is cleared for surgery or not. Pt states his surgery has moved up to 11/28/23. Please advise.

## 2023-11-20 NOTE — Telephone Encounter (Signed)
Pt returning call to Pre-Op

## 2023-11-20 NOTE — Telephone Encounter (Signed)
 Left message for the pt to call back to move tele appt up to this week per preop APP due to surgery date has been moved up from 01/03/24 to 11/28/23 per the pt. I did call the surgeon office as well to confirm this as we did not hear from the surgeon's off ice that they moved up surgery.   Per Vincent [name], PAC add pt on 1/2 or 1/3 in provider over book slot.

## 2023-11-20 NOTE — Telephone Encounter (Signed)
The pt called back while I was on the phone with another pt. I called the pt back and I got his vm again.

## 2023-11-21 HISTORY — PX: HIP SURGERY: SHX245

## 2023-11-22 NOTE — Telephone Encounter (Signed)
 Late entry I had s/w the pt the other day and we have moved up his tele pre op to 11/23/23, see previous notes. Our office received the clearance request again today, which came over still with the original date of surgery 01/03/24. Previous notes reflect that surgery has been moved up to 11/28/23. We have moved the pt tele preop to 11/23/23. Once the pt has been cleared we will fax notes to surgeon office.

## 2023-11-23 ENCOUNTER — Ambulatory Visit: Payer: Medicare Other | Attending: Nurse Practitioner | Admitting: Nurse Practitioner

## 2023-11-23 ENCOUNTER — Encounter: Payer: Self-pay | Admitting: Nurse Practitioner

## 2023-11-23 DIAGNOSIS — Z01810 Encounter for preprocedural cardiovascular examination: Secondary | ICD-10-CM

## 2023-11-23 NOTE — Telephone Encounter (Signed)
 Patient with diagnosis of afib on Eliquis  for anticoagulation.     Procedure:  Lt THA Anterior Hip  Date of procedure: 01/03/24     CHA2DS2-VASc Score = 3   This indicates a 3.2% annual risk of stroke. The patient's score is based upon: CHF History: 1 HTN History: 0 Diabetes History: 0 Stroke History: 0 Vascular Disease History: 0 Age Score: 2 Gender Score: 0    Crcl 84ml/min Plt 218    Per office protocol, patient can hold Eliquis  for 3 days prior to procedure.       **This guidance is not considered finalized until pre-operative APP has relayed final recommendations.*

## 2023-11-23 NOTE — Progress Notes (Signed)
 Virtual Visit via Telephone Note   Because of Vincent [name] co-morbid illnesses, he is at least at moderate risk for complications without adequate follow up.  This format is felt to be most appropriate for this patient at this time.  The patient did not have access to video technology/had technical difficulties with video requiring transitioning to audio format only (telephone).  All issues noted in this document were discussed and addressed.  No physical exam could be performed with this format.  Please refer to the patient's chart for his consent to telehealth for [facility].  Evaluation Performed:  Preoperative cardiovascular risk assessment _____________   Date:  11/23/2023   Patient ID:  Vincent [name], Vincent [name] November 24, 1940, MRN [id number] Patient Location:  Home Provider location:   Office  Primary Care Provider:  Sadie Manna, MD Primary Cardiologist:  Deatrice Cage, MD  Chief Complaint / Patient Profile   83 y.o. y/o male with a h/o supraventricular tachycardia, hyperlipidemia, mild cardiomyopathy with improvement in LVEF on most recent echo 09/2022 to 55-60%, PAF, atrial flutter s/p ablation on chronic anticoagulation, ventricular bigeminy, mild coronary artery disease on cath 2009 who is pending left total hip arthroplasty with Dr. Leora on 11/28/2023 and presents today for telephonic preoperative cardiovascular risk assessment.  History of Present Illness    Vincent [name] is a 83 y.o. male who presents via audio/video conferencing for a telehealth visit today.  Pt was last seen in cardiology clinic on 03/22/2023 by Dr. Cage.  At that time Vincent [name] was doing well.  The patient is now pending procedure as outlined above. Since his last visit, he denies chest pain, shortness of breath, lower extremity edema, fatigue, palpitations, melena, hematuria, hemoptysis, diaphoresis, weakness, presyncope, syncope, orthopnea, and PND. He is somewhat  limited by hip pain but is still able to achieve > 4 METS activity without concerning cardiac symptoms.   Past Medical History    Past Medical History:  Diagnosis Date   Arthritis    Dizziness and giddiness    Dysrhythmia    GERD (gastroesophageal reflux disease)    Gout    History of kidney stones    History of NICM    a. Prev EF as low as 35-40%;  b. 03/2012 Echo: EF 50-55%, Gr 1 DD, mild MR;  c. 06/2014 Echo: EF 50-55%, gr 1 DD, mild MR, mildly dil LA; d. 11/2019 Echo: EF 50-55%.   History of systolic CHF    a. Prev EF as low as 35-40%;  b. 03/2012 Echo: EF 50-55%, Gr 1 DD, mild MR;  c. 06/2014 Echo: EF 50-55%, gr 1 DD; d. 11/2019 Echo: EF 50-55%, no rwma, mildly dil LA, nl RV fxn, triv MR/PR.   Hypothyroidism    Non-obstructive CAD    a. 2009 nonobs dzs, LAD 20, EF 40-45%;  b. 07/2012 Ex MV: EF 55%, freq pvc's, no ischemia/infarct.   PAF (paroxysmal atrial fibrillation) (HCC)    a. 11/2013 - occurred 3 wks post-op Appe in setting of pelvic abscess-->amio-->converted but amio later d/c'd 2/2 hypothyroidism; b. 01/2020 recurrent rate-controlled Afib-->Eliquis -->s/p DCCV 02/2020.   Paroxysmal Atrial flutter (HCC)    a. 12/2019 s/p RFCA.   Paroxysmal supraventricular tachycardia (HCC)    a. SVT/PAT   Prostate enlargement    PSVT (paroxysmal supraventricular tachycardia) (HCC)    a. 12/2019 s/p RFCA.   PVC's (premature ventricular contractions)    a. Asymptomatic - managed with Toprol  and Mg.   Renal insufficiency  Sciatica of right side    Past Surgical History:  Procedure Laterality Date   ABSCESS DRAINAGE     APPENDECTOMY  2015   BACK SURGERY     CARDIAC CATHETERIZATION  2009   CARDIOVERSION N/A 03/15/2020   Procedure: CARDIOVERSION;  Surgeon: Darron Deatrice LABOR, MD;  Location: [facility];  Service: Cardiovascular;  Laterality: N/A;   CATARACT EXTRACTION Right    CATARACT EXTRACTION W/PHACO Right 10/17/2017   Procedure: CATARACT EXTRACTION PHACO AND INTRAOCULAR LENS PLACEMENT (IOC)  RIGHT;  Surgeon: Mittie Gaskin, MD;  Location: [facility];  Service: Ophthalmology;  Laterality: Right;   CATARACT EXTRACTION W/PHACO Left 12/24/2017   Procedure: CATARACT EXTRACTION PHACO AND INTRAOCULAR LENS PLACEMENT (IOC) LEFT;  Surgeon: Mittie Gaskin, MD;  Location: [facility];  Service: Ophthalmology;  Laterality: Left;   COLONOSCOPY     COLONOSCOPY WITH PROPOFOL  N/A 11/06/2018   Procedure: COLONOSCOPY WITH PROPOFOL ;  Surgeon: Toledo, Ladell POUR, MD;  Location: [facility];  Service: Gastroenterology;  Laterality: N/A;   COLONOSCOPY WITH PROPOFOL  N/A 07/05/2021   Procedure: COLONOSCOPY WITH PROPOFOL ;  Surgeon: Maryruth Ole DASEN, MD;  Location: [facility];  Service: Endoscopy;  Laterality: N/A;  REQUEST AM   DUPUYTREN / PALMAR FASCIOTOMY Bilateral    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   Procedure: DUPUYTREN CONTRACTURE RELEASE;  Surgeon: Cleotilde Barrio, MD;  Location: [facility];  Service: Orthopedics;  Laterality: Left;  left index and fourth finger   ESOPHAGOGASTRODUODENOSCOPY (EGD) WITH PROPOFOL  N/A 07/05/2021   Procedure: ESOPHAGOGASTRODUODENOSCOPY (EGD) WITH PROPOFOL ;  Surgeon: Maryruth Ole DASEN, MD;  Location: [facility];  Service: Endoscopy;  Laterality: N/A;   HAND SURGERY  1999   JOINT REPLACEMENT Right 2012   TKR   PARTIAL KNEE ARTHROPLASTY Left    SVT ABLATION N/A 12/29/2019   Procedure: SVT ABLATION;  Surgeon: Waddell Danelle ORN, MD;  Location: [facility];  Service: Cardiovascular;  Laterality: N/A;   TOTAL KNEE ARTHROPLASTY Right 06/17/2017    Allergies  No Known Allergies  Home Medications    Prior to Admission medications   Medication Sig Start Date End Date Taking? Authorizing Provider  acetaminophen  (TYLENOL ) 500 MG tablet Take 1,000 mg by mouth every 6 (six) hours as needed for moderate pain or headache.     [provider]  amoxicillin (AMOXIL) 500 MG tablet Take 2,000 mg by mouth See admin  instructions. Take 4 capsules (2000 mg) by mouth 1 hour prior to dental procedures.    [provider]  Biotin 5000 MCG TABS Take 5,000 mcg by mouth daily.    [provider]  Carboxymethylcellul-Glycerin (LUBRICATING EYE DROPS OP) Apply 1 drop to eye 2 (two) times daily as needed (dry eyes).    [provider]  ELIQUIS  5 MG TABS tablet TAKE 1 TABLET BY MOUTH TWICE A DAY 11/15/23   Darron Deatrice LABOR, MD  FIBER THERAPY 500 MG TABS Take 500 mg by mouth 2 (two) times daily.    [provider]  finasteride  (PROSCAR ) 5 MG tablet TAKE 1 TABLET (5 MG TOTAL) BY MOUTH DAILY. 08/17/23   Stoioff, Glendia BROCKS, MD  levothyroxine (SYNTHROID, LEVOTHROID) 25 MCG tablet Take 25 mcg by mouth daily before breakfast.    [provider]  magnesium  oxide (MAG-OX) 400 MG tablet Take 400 mg by mouth 2 (two) times daily.  09/03/12   [provider]  metoprolol  succinate (TOPROL -XL) 25 MG 24 hr tablet TAKE 1 TABLET (25 MG TOTAL) BY MOUTH DAILY. 08/17/23   Darron,  Deatrice LABOR, MD  pantoprazole (PROTONIX) 40 MG tablet Take 40 mg by mouth daily.    [provider]  rosuvastatin (CRESTOR) 5 MG tablet Take 5 mg by mouth daily.  09/05/19 03/22/23  [provider]  sodium chloride  (OCEAN) 0.65 % SOLN nasal spray Place 1 spray into both nostrils at bedtime.     [provider]  tamsulosin  (FLOMAX ) 0.4 MG CAPS capsule Take 1 capsule (0.4 mg total) by mouth daily. 10/15/23   Stoioff, Glendia BROCKS, MD  Trospium  Chloride 60 MG CP24 TAKE 1 CAPSULE BY MOUTH EVERY DAY 05/18/23   Stoioff, Glendia BROCKS, MD  Zinc 50 MG TABS Take 50 mg by mouth daily.    [provider]    Physical Exam    Vital Signs:  Quamel Tiandre Teall does not have vital signs available for review today.  Given telephonic nature of communication, physical exam is limited. AAOx3. NAD. Normal affect.  Speech and respirations are unlabored.  Accessory Clinical Findings    None  Assessment & Plan     1.  Preoperative Cardiovascular Risk Assessment: According to the Revised Cardiac Risk Index (RCRI), his Perioperative Risk of Major Cardiac Event is (%): 6.6. His Functional Capacity in METs is: 8.33 according to the Duke Activity Status Index (DASI). The patient is doing well from a cardiac perspective. Therefore, based on ACC/AHA guidelines, the patient would be at acceptable risk for the planned procedure without further cardiovascular testing.   The patient was advised that if he develops new symptoms prior to surgery to contact our office to arrange for a follow-up visit, and he verbalized understanding.  Per office protocol, patient can hold Eliquis  for 3 days prior to procedure.   A copy of this note will be routed to requesting surgeon.  Time:   Today, I have spent 10 minutes with the patient with telehealth technology discussing medical history, symptoms, and management plan.    Rosaline EMERSON Bane, NP-C  11/23/2023, 3:17 PM [address] Office [phone] Fax [phone]

## 2023-12-10 ENCOUNTER — Telehealth: Payer: Medicare Other

## 2023-12-17 NOTE — Telephone Encounter (Signed)
error

## 2024-01-15 ENCOUNTER — Other Ambulatory Visit: Payer: Self-pay | Admitting: Cardiovascular Disease

## 2024-01-15 DIAGNOSIS — I480 Paroxysmal atrial fibrillation: Secondary | ICD-10-CM

## 2024-01-28 ENCOUNTER — Other Ambulatory Visit: Payer: Self-pay | Admitting: Cardiovascular Disease

## 2024-01-28 DIAGNOSIS — I480 Paroxysmal atrial fibrillation: Secondary | ICD-10-CM

## 2024-01-28 NOTE — Telephone Encounter (Signed)
 Prescription refill request for Eliquis received. Indication:afib Last office visit:5/24 Scr:1.12  12/24 Age: 83 Weight:96.2  kg  Prescription refilled

## 2024-01-28 NOTE — Telephone Encounter (Signed)
 Refill request

## 2024-04-12 ENCOUNTER — Other Ambulatory Visit: Payer: Self-pay | Admitting: Cardiovascular Disease

## 2024-04-12 DIAGNOSIS — I480 Paroxysmal atrial fibrillation: Secondary | ICD-10-CM

## 2024-04-19 ENCOUNTER — Other Ambulatory Visit: Payer: Self-pay | Admitting: Urology

## 2024-05-29 ENCOUNTER — Ambulatory Visit: Attending: Cardiovascular Disease | Admitting: Cardiovascular Disease

## 2024-05-29 ENCOUNTER — Encounter: Payer: Self-pay | Admitting: Cardiovascular Disease

## 2024-05-29 VITALS — BP 90/60 | HR 75 | Ht 70.5 in | Wt 219.0 lb

## 2024-05-29 DIAGNOSIS — E785 Hyperlipidemia, unspecified: Secondary | ICD-10-CM

## 2024-05-29 DIAGNOSIS — I4819 Other persistent atrial fibrillation: Secondary | ICD-10-CM

## 2024-05-29 DIAGNOSIS — I493 Ventricular premature depolarization: Secondary | ICD-10-CM | POA: Diagnosis not present

## 2024-05-29 NOTE — Patient Instructions (Signed)
 Medication Instructions:  No changes *If you need a refill on your cardiac medications before your next appointment, please call your pharmacy*  Lab Work: Your provider would like for you to have the following labs today: CBC and BMET  If you have labs (blood work) drawn today and your tests are completely normal, you will receive your results only by: MyChart Message (if you have MyChart) OR A paper copy in the mail If you have any lab test that is abnormal or we need to change your treatment, we will call you to review the results.  Follow-Up: At [facility], you and your health needs are our priority.  As part of our continuing mission to provide you with exceptional heart care, our providers are all part of one team.  This team includes your primary Cardiologist (physician) and Advanced Practice Providers or APPs (Physician Assistants and Nurse Practitioners) who all work together to provide you with the care you need, when you need it.  Your next appointment:   3 week(s)  Provider:   Lonni Meager, NP Lesley Maffucci, PA-C Bernardino Bring, PA-C Cadence Franchester, PA-C Tylene Lunch, NP Barnie Hila, NP    We recommend signing up for the patient portal called MyChart.  Sign up information is provided on this After Visit Summary.  MyChart is used to connect with patients for Virtual Visits (Telemedicine).  Patients are able to view lab/test results, encounter notes, upcoming appointments, etc.  Non-urgent messages can be sent to your provider as well.   To learn more about what you can do with MyChart, go to [email or web address].   Other Instructions     Dear Vincent [name]  You are scheduled for a Cardioversion on Tuesday, July 15 with Dr. Gollan.  Please arrive at the [facility] Entrance of [facility], [address], [place] [address] at 6:30 AM (This is 1 hour(s) prior to your procedure time).  Proceed to the Check-In Desk directly inside the  entrance.  Procedure Parking: Use the entrance off of the [address] side of the hospital. Turn right upon entering and follow the driveway to parking that is directly in front of the [facility]. There is no valet parking available at this entrance, however there is an awning directly in front of the [facility] for drop off/ pick up for patients.   DIET:  Nothing to eat or drink after midnight except a sip of water with medications (see medication instructions below)  MEDICATION INSTRUCTIONS: !!IF ANY NEW MEDICATIONS ARE STARTED AFTER TODAY, PLEASE NOTIFY YOUR PROVIDER AS SOON AS POSSIBLE!!  FYI: Medications such as Semaglutide (Ozempic, [place]), Tirzepatide (Mounjaro, Zepbound), Dulaglutide (Trulicity), etc (GLP1 agonists) AND Canagliflozin (Invokana), Dapagliflozin (Farxiga), Empagliflozin (Jardiance), Ertugliflozin (Steglatro), Bexagliflozin [employer]) or any combination with one of these drugs such as Invokamet (Canagliflozin/Metformin), Synjardy (Empagliflozin/Metformin), etc (SGLT2 inhibitors) must be held around the time of a procedure. This is not a comprehensive list of all of these drugs. Please review all of your medications and talk to your provider if you take any one of these. If you are not sure, ask your provider.    Medications: Nothing to hold.  Do not stop the eliquis . If you miss a dose, please call the office at [phone]  LABS: Labs today  FYI:  For your safety, and to allow us  to monitor your vital signs accurately during the surgery/procedure we request: If you have artificial nails, gel coating, SNS etc, please have  those removed prior to your surgery/procedure. Not having the nail coverings /polish removed may result in cancellation or delay of your surgery/procedure.  Your support person will be asked to wait in the waiting room during your procedure.  It is OK to have someone drop you off and come back when you are ready to be  discharged.  You cannot drive after the procedure and will need someone to drive you home.  Bring your insurance cards.  *Special Note: Every effort is made to have your procedure done on time. Occasionally there are emergencies that occur at the hospital that may cause delays. Please be patient if a delay does occur.

## 2024-05-29 NOTE — H&P (View-Only) (Signed)
 Cardiology Office Note   Date:  05/29/2024   ID:  Barrington, Worley December 13, 1940, MRN [id number]  PCP:  Sadie Manna, MD  Cardiologist:   Deatrice Cage, MD   Chief Complaint  Patient presents with   Follow-up    6 month f/u no complaints today. Meds reviewed verbally with pt.      History of Present Illness: Vincent [name] is a 83 y.o. male who presents for a follow up regarding supraventricular tachycardia, mild cardiomyopathy, paroxysmal atrial fibrillation, atrial flutter and ventricular bigeminy.  Cardiac catheterization in 2009 showed an ejection fraction of 40-45% range with a mild nonobstructive 20% plaque in his LAD.  The patient had symptomatic SVT and atrial flutter status post ablation by Dr. Waddell in February 2021.  He was found to be in atrial fibrillation after ablation and was subsequently started on anticoagulation and underwent cardioversion no recurrent arrhythmia since then.  Most recent ischemic cardiac evaluation in May 2021 included a Lexiscan  Myoview  which showed no evidence of ischemia with normal ejection fraction.  Most recent echocardiogram in November 2023 showed normal LV systolic function, mildly dilated left atrium, mild mitral regurgitation and mild tricuspid regurgitation.  He is noted to be in atrial fibrillation today.  He denies palpitations but does report increased fatigue.  No chest pain or shortness of breath.  He denies dizziness or syncope.  Past Medical History:  Diagnosis Date   Arthritis    Dizziness and giddiness    Dysrhythmia    GERD (gastroesophageal reflux disease)    Gout    History of kidney stones    History of NICM    a. Prev EF as low as 35-40%;  b. 03/2012 Echo: EF 50-55%, Gr 1 DD, mild MR;  c. 06/2014 Echo: EF 50-55%, gr 1 DD, mild MR, mildly dil LA; d. 11/2019 Echo: EF 50-55%.   History of systolic CHF    a. Prev EF as low as 35-40%;  b. 03/2012 Echo: EF 50-55%, Gr 1 DD, mild MR;  c. 06/2014 Echo: EF  50-55%, gr 1 DD; d. 11/2019 Echo: EF 50-55%, no rwma, mildly dil LA, nl RV fxn, triv MR/PR.   Hypothyroidism    Non-obstructive CAD    a. 2009 nonobs dzs, LAD 20, EF 40-45%;  b. 07/2012 Ex MV: EF 55%, freq pvc's, no ischemia/infarct.   PAF (paroxysmal atrial fibrillation) (HCC)    a. 11/2013 - occurred 3 wks post-op Appe in setting of pelvic abscess-->amio-->converted but amio later d/c'd 2/2 hypothyroidism; b. 01/2020 recurrent rate-controlled Afib-->Eliquis -->s/p DCCV 02/2020.   Paroxysmal Atrial flutter (HCC)    a. 12/2019 s/p RFCA.   Paroxysmal supraventricular tachycardia (HCC)    a. SVT/PAT   Prostate enlargement    PSVT (paroxysmal supraventricular tachycardia) (HCC)    a. 12/2019 s/p RFCA.   PVC's (premature ventricular contractions)    a. Asymptomatic - managed with Toprol  and Mg.   Renal insufficiency    Sciatica of right side     Past Surgical History:  Procedure Laterality Date   ABSCESS DRAINAGE     APPENDECTOMY  2015   BACK SURGERY     CARDIAC CATHETERIZATION  2009   CARDIOVERSION N/A 03/15/2020   Procedure: CARDIOVERSION;  Surgeon: Cage Deatrice LABOR, MD;  Location: [facility];  Service: Cardiovascular;  Laterality: N/A;   CATARACT EXTRACTION Right    CATARACT EXTRACTION W/PHACO Right 10/17/2017   Procedure: CATARACT EXTRACTION PHACO AND INTRAOCULAR LENS PLACEMENT (IOC) RIGHT;  Surgeon: Mittie Gaskin, MD;  Location: [facility];  Service: Ophthalmology;  Laterality: Right;   CATARACT EXTRACTION W/PHACO Left 12/24/2017   Procedure: CATARACT EXTRACTION PHACO AND INTRAOCULAR LENS PLACEMENT (IOC) LEFT;  Surgeon: Mittie Gaskin, MD;  Location: [facility];  Service: Ophthalmology;  Laterality: Left;   COLONOSCOPY     COLONOSCOPY WITH PROPOFOL  N/A 11/06/2018   Procedure: COLONOSCOPY WITH PROPOFOL ;  Surgeon: Toledo, Ladell POUR, MD;  Location: [facility];  Service: Gastroenterology;  Laterality: N/A;   COLONOSCOPY WITH PROPOFOL  N/A 07/05/2021   Procedure:  COLONOSCOPY WITH PROPOFOL ;  Surgeon: Maryruth Ole DASEN, MD;  Location: [facility];  Service: Endoscopy;  Laterality: N/A;  REQUEST AM   DUPUYTREN / PALMAR FASCIOTOMY Bilateral    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   Procedure: DUPUYTREN CONTRACTURE RELEASE;  Surgeon: Cleotilde Barrio, MD;  Location: [facility];  Service: Orthopedics;  Laterality: Left;  left index and fourth finger   ESOPHAGOGASTRODUODENOSCOPY (EGD) WITH PROPOFOL  N/A 07/05/2021   Procedure: ESOPHAGOGASTRODUODENOSCOPY (EGD) WITH PROPOFOL ;  Surgeon: Maryruth Ole DASEN, MD;  Location: [facility];  Service: Endoscopy;  Laterality: N/A;   HAND SURGERY  1999   JOINT REPLACEMENT Right 2012   TKR   PARTIAL KNEE ARTHROPLASTY Left    SVT ABLATION N/A 12/29/2019   Procedure: SVT ABLATION;  Surgeon: Waddell Danelle ORN, MD;  Location: [facility];  Service: Cardiovascular;  Laterality: N/A;   TOTAL KNEE ARTHROPLASTY Right 06/17/2017     Current Outpatient Medications  Medication Sig Dispense Refill   acetaminophen  (TYLENOL ) 500 MG tablet Take 1,000 mg by mouth every 6 (six) hours as needed for moderate pain or headache.      amoxicillin (AMOXIL) 500 MG tablet Take 2,000 mg by mouth See admin instructions. Take 4 capsules (2000 mg) by mouth 1 hour prior to dental procedures.     apixaban  (ELIQUIS ) 5 MG TABS tablet TAKE 1 TABLET BY MOUTH TWICE A DAY 180 tablet 1   Biotin 5000 MCG TABS Take 5,000 mcg by mouth daily.     Carboxymethylcellul-Glycerin (LUBRICATING EYE DROPS OP) Apply 1 drop to eye 2 (two) times daily as needed (dry eyes).     FIBER THERAPY 500 MG TABS Take 500 mg by mouth 2 (two) times daily.     finasteride  (PROSCAR ) 5 MG tablet TAKE 1 TABLET (5 MG TOTAL) BY MOUTH DAILY. 90 tablet 1   gabapentin  (NEURONTIN ) 300 MG capsule Take 300 mg by mouth 2 (two) times daily.     levothyroxine (SYNTHROID, LEVOTHROID) 25 MCG tablet Take 25 mcg by mouth daily before breakfast.     magnesium  oxide (MAG-OX) 400 MG tablet  Take 400 mg by mouth 2 (two) times daily.      metoprolol  succinate (TOPROL -XL) 25 MG 24 hr tablet TAKE 1 TABLET (25 MG TOTAL) BY MOUTH DAILY. 90 tablet 0   pantoprazole (PROTONIX) 40 MG tablet Take 40 mg by mouth daily.     rosuvastatin (CRESTOR) 5 MG tablet Take 5 mg by mouth daily.      sodium chloride  (OCEAN) 0.65 % SOLN nasal spray Place 1 spray into both nostrils at bedtime.      tamsulosin  (FLOMAX ) 0.4 MG CAPS capsule Take 1 capsule (0.4 mg total) by mouth daily. 90 capsule 3   Zinc 50 MG TABS Take 50 mg by mouth daily.     Trospium  Chloride 60 MG CP24 TAKE 1 CAPSULE BY MOUTH EVERY DAY (Patient not taking: Reported on 05/29/2024) 90 capsule 1   No current facility-administered medications for this visit.  Allergies:   Patient has no known allergies.    Social History:  The patient  reports that he has never smoked. He has never used smokeless tobacco. He reports that he does not drink alcohol and does not use drugs.   Family History:  The patient's family history includes Coronary artery disease in an other family member; Heart attack (age of onset: 55) in his father; Heart failure in an other family member.    ROS:  Please see the history of present illness.   Otherwise, review of systems are positive for none.   All other systems are reviewed and negative.    PHYSICAL EXAM: VS:  BP 90/60 (BP Location: Left Arm, Patient Position: Sitting, Cuff Size: Normal)   Pulse 75   Ht 5' 10.5 (1.791 m)   Wt 219 lb (99.3 kg)   SpO2 97%   BMI 30.98 kg/m  , BMI Body mass index is 30.98 kg/m. GEN: Well nourished, well developed, in no acute distress  HEENT: normal  Neck: no JVD, carotid bruits, or masses Cardiac: Irregularly irregular; no murmurs, rubs, or gallops,no edema  Respiratory:  clear to auscultation bilaterally, normal work of breathing GI: soft, nontender, nondistended, + BS MS: no deformity or atrophy  Skin: warm and dry, no rash Neuro:  Strength and sensation are  intact Psych: euthymic mood, full affect   EKG:  EKG is ordered today. The ekg ordered today demonstrates : Atrial fibrillation with premature ventricular or aberrantly conducted complexes Nonspecific ST and T wave abnormality When compared with ECG of 15-Jul-2020 08:12, Atrial fibrillation has replaced Sinus rhythm    Recent Labs: No results found for requested labs within last 365 days.    Lipid Panel    Component Value Date/Time   CHOL 218 10/25/2009 0000   TRIG 189 10/25/2009 0000   HDL 37.3 10/25/2009 0000   LDLCALC 142.9 10/25/2009 0000      Wt Readings from Last 3 Encounters:  05/29/24 219 lb (99.3 kg)  10/15/23 212 lb (96.2 kg)  03/22/23 218 lb 4 oz (99 kg)          No data to display            ASSESSMENT AND PLAN:  1.   Persistent atrial fibrillation: He is noted to be in atrial fibrillation today.  Other than increased fatigue, he appears to be asymptomatic.  Ventricular rate is reasonably controlled with Toprol .  He has not missed any dose of Eliquis  5 mg twice daily.  I recommend trying to get him back in sinus rhythm.  Recommend cardioversion.  I discussed risks and benefits.  If we have hard time keeping him in sinus rhythm, it might be reasonable to continue with rate control considering minimal symptoms.    2. Frequent PVCs: He does have PVCs today on EKG but again he does not seem to be symptomatic and most recent ejection fraction was normal.  Continue Toprol .   3. History of nonischemic cardiomyopathy: Most recent echocardiogram in November 2023 showed improvement in LV systolic function with an EF of 55 to 60%.  4. Paroxysmal supraventricular tachycardia and atrial flutter: Status post successful ablation.  Continue Toprol .  5.  Hyperlipidemia: Most recent lipid profile showed an LDL of 74.  Continue small dose rosuvastatin.   Informed Consent   Shared Decision Making/Informed Consent The risks (stroke, cardiac arrhythmias rarely resulting  in the need for a temporary or permanent pacemaker, skin irritation or burns and complications associated with conscious  sedation including aspiration, arrhythmia, respiratory failure and death), benefits (restoration of normal sinus rhythm) and alternatives of a direct current cardioversion were explained in detail to Mr. Goodchild and he agrees to proceed.      Disposition: Proceed with cardioversion and follow-up in a month after.    Signed,  Deatrice Cage, MD  05/29/2024 8:00 AM    [facility]

## 2024-05-29 NOTE — Progress Notes (Signed)
 Cardiology Office Note   Date:  05/29/2024   ID:  Barrington, Worley December 13, 1940, MRN [id number]  PCP:  Sadie Manna, MD  Cardiologist:   Deatrice Cage, MD   Chief Complaint  Patient presents with   Follow-up    6 month f/u no complaints today. Meds reviewed verbally with pt.      History of Present Illness: Vincent [name] is a 83 y.o. male who presents for a follow up regarding supraventricular tachycardia, mild cardiomyopathy, paroxysmal atrial fibrillation, atrial flutter and ventricular bigeminy.  Cardiac catheterization in 2009 showed an ejection fraction of 40-45% range with a mild nonobstructive 20% plaque in his LAD.  The patient had symptomatic SVT and atrial flutter status post ablation by Dr. Waddell in February 2021.  He was found to be in atrial fibrillation after ablation and was subsequently started on anticoagulation and underwent cardioversion no recurrent arrhythmia since then.  Most recent ischemic cardiac evaluation in May 2021 included a Lexiscan  Myoview  which showed no evidence of ischemia with normal ejection fraction.  Most recent echocardiogram in November 2023 showed normal LV systolic function, mildly dilated left atrium, mild mitral regurgitation and mild tricuspid regurgitation.  He is noted to be in atrial fibrillation today.  He denies palpitations but does report increased fatigue.  No chest pain or shortness of breath.  He denies dizziness or syncope.  Past Medical History:  Diagnosis Date   Arthritis    Dizziness and giddiness    Dysrhythmia    GERD (gastroesophageal reflux disease)    Gout    History of kidney stones    History of NICM    a. Prev EF as low as 35-40%;  b. 03/2012 Echo: EF 50-55%, Gr 1 DD, mild MR;  c. 06/2014 Echo: EF 50-55%, gr 1 DD, mild MR, mildly dil LA; d. 11/2019 Echo: EF 50-55%.   History of systolic CHF    a. Prev EF as low as 35-40%;  b. 03/2012 Echo: EF 50-55%, Gr 1 DD, mild MR;  c. 06/2014 Echo: EF  50-55%, gr 1 DD; d. 11/2019 Echo: EF 50-55%, no rwma, mildly dil LA, nl RV fxn, triv MR/PR.   Hypothyroidism    Non-obstructive CAD    a. 2009 nonobs dzs, LAD 20, EF 40-45%;  b. 07/2012 Ex MV: EF 55%, freq pvc's, no ischemia/infarct.   PAF (paroxysmal atrial fibrillation) (HCC)    a. 11/2013 - occurred 3 wks post-op Appe in setting of pelvic abscess-->amio-->converted but amio later d/c'd 2/2 hypothyroidism; b. 01/2020 recurrent rate-controlled Afib-->Eliquis -->s/p DCCV 02/2020.   Paroxysmal Atrial flutter (HCC)    a. 12/2019 s/p RFCA.   Paroxysmal supraventricular tachycardia (HCC)    a. SVT/PAT   Prostate enlargement    PSVT (paroxysmal supraventricular tachycardia) (HCC)    a. 12/2019 s/p RFCA.   PVC's (premature ventricular contractions)    a. Asymptomatic - managed with Toprol  and Mg.   Renal insufficiency    Sciatica of right side     Past Surgical History:  Procedure Laterality Date   ABSCESS DRAINAGE     APPENDECTOMY  2015   BACK SURGERY     CARDIAC CATHETERIZATION  2009   CARDIOVERSION N/A 03/15/2020   Procedure: CARDIOVERSION;  Surgeon: Cage Deatrice LABOR, MD;  Location: [facility];  Service: Cardiovascular;  Laterality: N/A;   CATARACT EXTRACTION Right    CATARACT EXTRACTION W/PHACO Right 10/17/2017   Procedure: CATARACT EXTRACTION PHACO AND INTRAOCULAR LENS PLACEMENT (IOC) RIGHT;  Surgeon: Mittie Gaskin, MD;  Location: [facility];  Service: Ophthalmology;  Laterality: Right;   CATARACT EXTRACTION W/PHACO Left 12/24/2017   Procedure: CATARACT EXTRACTION PHACO AND INTRAOCULAR LENS PLACEMENT (IOC) LEFT;  Surgeon: Mittie Gaskin, MD;  Location: [facility];  Service: Ophthalmology;  Laterality: Left;   COLONOSCOPY     COLONOSCOPY WITH PROPOFOL  N/A 11/06/2018   Procedure: COLONOSCOPY WITH PROPOFOL ;  Surgeon: Toledo, Ladell POUR, MD;  Location: [facility];  Service: Gastroenterology;  Laterality: N/A;   COLONOSCOPY WITH PROPOFOL  N/A 07/05/2021   Procedure:  COLONOSCOPY WITH PROPOFOL ;  Surgeon: Maryruth Ole DASEN, MD;  Location: [facility];  Service: Endoscopy;  Laterality: N/A;  REQUEST AM   DUPUYTREN / PALMAR FASCIOTOMY Bilateral    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   Procedure: DUPUYTREN CONTRACTURE RELEASE;  Surgeon: Cleotilde Barrio, MD;  Location: [facility];  Service: Orthopedics;  Laterality: Left;  left index and fourth finger   ESOPHAGOGASTRODUODENOSCOPY (EGD) WITH PROPOFOL  N/A 07/05/2021   Procedure: ESOPHAGOGASTRODUODENOSCOPY (EGD) WITH PROPOFOL ;  Surgeon: Maryruth Ole DASEN, MD;  Location: [facility];  Service: Endoscopy;  Laterality: N/A;   HAND SURGERY  1999   JOINT REPLACEMENT Right 2012   TKR   PARTIAL KNEE ARTHROPLASTY Left    SVT ABLATION N/A 12/29/2019   Procedure: SVT ABLATION;  Surgeon: Waddell Danelle ORN, MD;  Location: [facility];  Service: Cardiovascular;  Laterality: N/A;   TOTAL KNEE ARTHROPLASTY Right 06/17/2017     Current Outpatient Medications  Medication Sig Dispense Refill   acetaminophen  (TYLENOL ) 500 MG tablet Take 1,000 mg by mouth every 6 (six) hours as needed for moderate pain or headache.      amoxicillin (AMOXIL) 500 MG tablet Take 2,000 mg by mouth See admin instructions. Take 4 capsules (2000 mg) by mouth 1 hour prior to dental procedures.     apixaban  (ELIQUIS ) 5 MG TABS tablet TAKE 1 TABLET BY MOUTH TWICE A DAY 180 tablet 1   Biotin 5000 MCG TABS Take 5,000 mcg by mouth daily.     Carboxymethylcellul-Glycerin (LUBRICATING EYE DROPS OP) Apply 1 drop to eye 2 (two) times daily as needed (dry eyes).     FIBER THERAPY 500 MG TABS Take 500 mg by mouth 2 (two) times daily.     finasteride  (PROSCAR ) 5 MG tablet TAKE 1 TABLET (5 MG TOTAL) BY MOUTH DAILY. 90 tablet 1   gabapentin  (NEURONTIN ) 300 MG capsule Take 300 mg by mouth 2 (two) times daily.     levothyroxine (SYNTHROID, LEVOTHROID) 25 MCG tablet Take 25 mcg by mouth daily before breakfast.     magnesium  oxide (MAG-OX) 400 MG tablet  Take 400 mg by mouth 2 (two) times daily.      metoprolol  succinate (TOPROL -XL) 25 MG 24 hr tablet TAKE 1 TABLET (25 MG TOTAL) BY MOUTH DAILY. 90 tablet 0   pantoprazole (PROTONIX) 40 MG tablet Take 40 mg by mouth daily.     rosuvastatin (CRESTOR) 5 MG tablet Take 5 mg by mouth daily.      sodium chloride  (OCEAN) 0.65 % SOLN nasal spray Place 1 spray into both nostrils at bedtime.      tamsulosin  (FLOMAX ) 0.4 MG CAPS capsule Take 1 capsule (0.4 mg total) by mouth daily. 90 capsule 3   Zinc 50 MG TABS Take 50 mg by mouth daily.     Trospium  Chloride 60 MG CP24 TAKE 1 CAPSULE BY MOUTH EVERY DAY (Patient not taking: Reported on 05/29/2024) 90 capsule 1   No current facility-administered medications for this visit.  Allergies:   Patient has no known allergies.    Social History:  The patient  reports that he has never smoked. He has never used smokeless tobacco. He reports that he does not drink alcohol and does not use drugs.   Family History:  The patient's family history includes Coronary artery disease in an other family member; Heart attack (age of onset: 55) in his father; Heart failure in an other family member.    ROS:  Please see the history of present illness.   Otherwise, review of systems are positive for none.   All other systems are reviewed and negative.    PHYSICAL EXAM: VS:  BP 90/60 (BP Location: Left Arm, Patient Position: Sitting, Cuff Size: Normal)   Pulse 75   Ht 5' 10.5 (1.791 m)   Wt 219 lb (99.3 kg)   SpO2 97%   BMI 30.98 kg/m  , BMI Body mass index is 30.98 kg/m. GEN: Well nourished, well developed, in no acute distress  HEENT: normal  Neck: no JVD, carotid bruits, or masses Cardiac: Irregularly irregular; no murmurs, rubs, or gallops,no edema  Respiratory:  clear to auscultation bilaterally, normal work of breathing GI: soft, nontender, nondistended, + BS MS: no deformity or atrophy  Skin: warm and dry, no rash Neuro:  Strength and sensation are  intact Psych: euthymic mood, full affect   EKG:  EKG is ordered today. The ekg ordered today demonstrates : Atrial fibrillation with premature ventricular or aberrantly conducted complexes Nonspecific ST and T wave abnormality When compared with ECG of 15-Jul-2020 08:12, Atrial fibrillation has replaced Sinus rhythm    Recent Labs: No results found for requested labs within last 365 days.    Lipid Panel    Component Value Date/Time   CHOL 218 10/25/2009 0000   TRIG 189 10/25/2009 0000   HDL 37.3 10/25/2009 0000   LDLCALC 142.9 10/25/2009 0000      Wt Readings from Last 3 Encounters:  05/29/24 219 lb (99.3 kg)  10/15/23 212 lb (96.2 kg)  03/22/23 218 lb 4 oz (99 kg)          No data to display            ASSESSMENT AND PLAN:  1.   Persistent atrial fibrillation: He is noted to be in atrial fibrillation today.  Other than increased fatigue, he appears to be asymptomatic.  Ventricular rate is reasonably controlled with Toprol .  He has not missed any dose of Eliquis  5 mg twice daily.  I recommend trying to get him back in sinus rhythm.  Recommend cardioversion.  I discussed risks and benefits.  If we have hard time keeping him in sinus rhythm, it might be reasonable to continue with rate control considering minimal symptoms.    2. Frequent PVCs: He does have PVCs today on EKG but again he does not seem to be symptomatic and most recent ejection fraction was normal.  Continue Toprol .   3. History of nonischemic cardiomyopathy: Most recent echocardiogram in November 2023 showed improvement in LV systolic function with an EF of 55 to 60%.  4. Paroxysmal supraventricular tachycardia and atrial flutter: Status post successful ablation.  Continue Toprol .  5.  Hyperlipidemia: Most recent lipid profile showed an LDL of 74.  Continue small dose rosuvastatin.   Informed Consent   Shared Decision Making/Informed Consent The risks (stroke, cardiac arrhythmias rarely resulting  in the need for a temporary or permanent pacemaker, skin irritation or burns and complications associated with conscious  sedation including aspiration, arrhythmia, respiratory failure and death), benefits (restoration of normal sinus rhythm) and alternatives of a direct current cardioversion were explained in detail to Mr. Goodchild and he agrees to proceed.      Disposition: Proceed with cardioversion and follow-up in a month after.    Signed,  Deatrice Cage, MD  05/29/2024 8:00 AM    [facility]

## 2024-05-30 LAB — CBC
Hematocrit: 40.6 % (ref 37.5–51.0)
Hemoglobin: 13 g/dL (ref 13.0–17.7)
MCH: 30 pg (ref 26.6–33.0)
MCHC: 32 g/dL (ref 31.5–35.7)
MCV: 94 fL (ref 79–97)
Platelets: 175 x10E3/uL (ref 150–450)
RBC: 4.33 x10E6/uL (ref 4.14–5.80)
RDW: 12.7 % (ref 11.6–15.4)
WBC: 4.6 x10E3/uL (ref 3.4–10.8)

## 2024-05-30 LAB — BASIC METABOLIC PANEL WITH GFR
BUN/Creatinine Ratio: 19 (ref 10–24)
BUN: 21 mg/dL (ref 8–27)
CO2: 19 mmol/L — ABNORMAL LOW (ref 20–29)
Calcium: 9.6 mg/dL (ref 8.6–10.2)
Chloride: 103 mmol/L (ref 96–106)
Creatinine, Ser: 1.11 mg/dL (ref 0.76–1.27)
Glucose: 96 mg/dL (ref 70–99)
Potassium: 4.5 mmol/L (ref 3.5–5.2)
Sodium: 140 mmol/L (ref 134–144)
eGFR: 65 mL/min/1.73 (ref >59)

## 2024-06-02 ENCOUNTER — Telehealth: Payer: Self-pay | Admitting: *Deleted

## 2024-06-02 NOTE — Telephone Encounter (Signed)
 Left voicemail message to call back.    Called to confirm/remind patient of their procedure.   Scheduled for: DCCV  []  Date 06/03/24   []  Time 07:30 am []  Arrival time 06:30 am []  Location [facility]   []  Designated Driver  [employer], time, and location reviewed with patient  [] VM Full/unable to leave message [x]  Left voicemail message to call back.   []  Phone not in service   [x]  H&P within 30 days  [x]  EKG within 30 days  [x]  Orders  [x]  Labs  [x]  GFR N/A  []  Diet  []  Medication instructions None  []  Contrast Allergy

## 2024-06-02 NOTE — Telephone Encounter (Signed)
 Spoke with patient and reviewed all information.   Scheduled for: DCCV             [x]  Date 06/03/24                       [x]  Time 07:30 am [x]  Arrival time 06:30 am [x]  Location [facility]                  [x]  Designated Driver             [x]  Instructions, time, and location reviewed with patient   [] VM Full/unable to leave message []  Left voicemail message to call back.              []  Phone not in service              [x]  H&P within 30 days             [x]  EKG within 30 days             [x]  Orders             [x]  Labs             [x]  GFR N/A             [x]  Diet             [x]  Medication instructions None             []  Contrast Allergy

## 2024-06-03 ENCOUNTER — Encounter
Admission: RE | Disposition: A | Discharge status: Home / Self Care | Payer: Self-pay | Source: Home / Self Care | Attending: Cardiovascular Disease

## 2024-06-03 ENCOUNTER — Ambulatory Visit: Admitting: Certified Registered Nurse Anesthetist

## 2024-06-03 ENCOUNTER — Encounter: Payer: Self-pay | Admitting: Cardiovascular Disease

## 2024-06-03 ENCOUNTER — Ambulatory Visit
Admission: RE | Admit: 2024-06-03 | Discharge: 2024-06-03 | Disposition: A | Discharge status: Home / Self Care | Attending: Cardiovascular Disease | Admitting: Cardiovascular Disease

## 2024-06-03 DIAGNOSIS — E785 Hyperlipidemia, unspecified: Secondary | ICD-10-CM | POA: Diagnosis not present

## 2024-06-03 DIAGNOSIS — I428 Other cardiomyopathies: Secondary | ICD-10-CM | POA: Diagnosis not present

## 2024-06-03 DIAGNOSIS — I081 Rheumatic disorders of both mitral and tricuspid valves: Secondary | ICD-10-CM | POA: Diagnosis not present

## 2024-06-03 DIAGNOSIS — I4819 Other persistent atrial fibrillation: Secondary | ICD-10-CM | POA: Insufficient documentation

## 2024-06-03 DIAGNOSIS — Z7901 Long term (current) use of anticoagulants: Secondary | ICD-10-CM | POA: Insufficient documentation

## 2024-06-03 DIAGNOSIS — R0602 Shortness of breath: Secondary | ICD-10-CM

## 2024-06-03 DIAGNOSIS — Z79899 Other long term (current) drug therapy: Secondary | ICD-10-CM | POA: Diagnosis not present

## 2024-06-03 DIAGNOSIS — I4892 Unspecified atrial flutter: Secondary | ICD-10-CM | POA: Insufficient documentation

## 2024-06-03 DIAGNOSIS — N289 Disorder of kidney and ureter, unspecified: Secondary | ICD-10-CM | POA: Insufficient documentation

## 2024-06-03 DIAGNOSIS — I509 Heart failure, unspecified: Secondary | ICD-10-CM | POA: Diagnosis not present

## 2024-06-03 DIAGNOSIS — E039 Hypothyroidism, unspecified: Secondary | ICD-10-CM | POA: Diagnosis not present

## 2024-06-03 DIAGNOSIS — Z8249 Family history of ischemic heart disease and other diseases of the circulatory system: Secondary | ICD-10-CM | POA: Diagnosis not present

## 2024-06-03 DIAGNOSIS — I4891 Unspecified atrial fibrillation: Secondary | ICD-10-CM | POA: Diagnosis present

## 2024-06-03 DIAGNOSIS — I2510 Atherosclerotic heart disease of native coronary artery without angina pectoris: Secondary | ICD-10-CM | POA: Insufficient documentation

## 2024-06-03 DIAGNOSIS — G709 Myoneural disorder, unspecified: Secondary | ICD-10-CM | POA: Diagnosis not present

## 2024-06-03 DIAGNOSIS — I4719 Other supraventricular tachycardia: Secondary | ICD-10-CM | POA: Diagnosis not present

## 2024-06-03 HISTORY — PX: CARDIOVERSION: SHX1299

## 2024-06-03 SURGERY — CARDIOVERSION
Anesthesia: General

## 2024-06-03 MED ORDER — PROPOFOL 10 MG/ML IV BOLUS
INTRAVENOUS | Status: DC | PRN
  Administered 2024-06-03 (×2): 30 mg via INTRAVENOUS

## 2024-06-03 MED ORDER — GLYCOPYRROLATE 0.2 MG/ML IJ SOLN
INTRAMUSCULAR | Status: AC
  Filled 2024-06-03: qty 1

## 2024-06-03 MED ORDER — PROPOFOL 10 MG/ML IV BOLUS
INTRAVENOUS | Status: AC
  Filled 2024-06-03: qty 60

## 2024-06-03 NOTE — Anesthesia Preprocedure Evaluation (Signed)
 Anesthesia Evaluation  Patient identified by MRN, date of birth, ID band Patient awake    Reviewed: Allergy & Precautions, H&P , NPO status , Patient's Chart, lab work & pertinent test results, reviewed documented beta blocker date and time   Airway Mallampati: II   Neck ROM: full    Dental  (+) Poor Dentition   Pulmonary neg pulmonary ROS   Pulmonary exam normal        Cardiovascular Exercise Tolerance: Poor + CAD and +CHF  (-) Orthopnea and (-) PND + dysrhythmias Atrial Fibrillation  Rhythm:regular Rate:Normal     Neuro/Psych  Neuromuscular disease  negative psych ROS   GI/Hepatic Neg liver ROS,GERD  Medicated,,  Endo/Other  Hypothyroidism    Renal/GU Renal disease  negative genitourinary   Musculoskeletal   Abdominal   Peds  Hematology negative hematology ROS (+)   Anesthesia Other Findings Past Medical History: No date: Arthritis No date: Dizziness and giddiness No date: Dysrhythmia No date: GERD (gastroesophageal reflux disease) No date: Gout No date: History of kidney stones No date: History of NICM     Comment:  a. Prev EF as low as 35-40%;  b. 03/2012 Echo: EF 50-55%,              Gr 1 DD, mild MR;  c. 06/2014 Echo: EF 50-55%, gr 1 DD,               mild MR, mildly dil LA; d. 11/2019 Echo: EF 50-55%. No date: History of systolic CHF     Comment:  a. Prev EF as low as 35-40%;  b. 03/2012 Echo: EF 50-55%,              Gr 1 DD, mild MR;  c. 06/2014 Echo: EF 50-55%, gr 1 DD; d.              11/2019 Echo: EF 50-55%, no rwma, mildly dil LA, nl RV               fxn, triv MR/PR. No date: Hypothyroidism No date: Non-obstructive CAD     Comment:  a. 2009 nonobs dzs, LAD 20, EF 40-45%;  b. 07/2012 Ex MV:              EF 55%, freq pvc's, no ischemia/infarct. No date: PAF (paroxysmal atrial fibrillation) (HCC)     Comment:  a. 11/2013 - occurred 3 wks post-op Appe in setting of               pelvic  abscess-->amio-->converted but amio later d/c'd               2/2 hypothyroidism; b. 01/2020 recurrent rate-controlled               Afib-->Eliquis -->s/p DCCV 02/2020. No date: Paroxysmal Atrial flutter (HCC)     Comment:  a. 12/2019 s/p RFCA. No date: Paroxysmal supraventricular tachycardia (HCC)     Comment:  a. SVT/PAT No date: Prostate enlargement No date: PSVT (paroxysmal supraventricular tachycardia) (HCC)     Comment:  a. 12/2019 s/p RFCA. No date: PVC's (premature ventricular contractions)     Comment:  a. Asymptomatic - managed with Toprol  and Mg. No date: Renal insufficiency No date: Sciatica of right side Past Surgical History: No date: ABSCESS DRAINAGE 2015: APPENDECTOMY No date: BACK SURGERY 2009: CARDIAC CATHETERIZATION 03/15/2020: CARDIOVERSION; N/A     Comment:  Procedure: CARDIOVERSION;  Surgeon: Darron Deatrice LABOR,  MD;  Location: [facility];  Service: Cardiovascular;                Laterality: N/A; No date: CATARACT EXTRACTION; Right 10/17/2017: CATARACT EXTRACTION W/PHACO; Right     Comment:  Procedure: CATARACT EXTRACTION PHACO AND INTRAOCULAR               LENS PLACEMENT (IOC) RIGHT;  Surgeon: Mittie Gaskin, MD;  Location: [facility];  Service:               Ophthalmology;  Laterality: Right; 12/24/2017: CATARACT EXTRACTION W/PHACO; Left     Comment:  Procedure: CATARACT EXTRACTION PHACO AND INTRAOCULAR               LENS PLACEMENT (IOC) LEFT;  Surgeon: Mittie Gaskin, MD;  Location: [facility];  Service:               Ophthalmology;  Laterality: Left; No date: COLONOSCOPY 11/06/2018: COLONOSCOPY WITH PROPOFOL ; N/A     Comment:  Procedure: COLONOSCOPY WITH PROPOFOL ;  Surgeon: [place],               Ladell POUR, MD;  Location: [facility];  Service:               Gastroenterology;  Laterality: N/A; 07/05/2021: COLONOSCOPY WITH PROPOFOL ; N/A     Comment:  Procedure: COLONOSCOPY WITH PROPOFOL ;   Surgeon:               Maryruth Ole DASEN, MD;  Location: [facility];                Service: Endoscopy;  Laterality: N/A;  REQUEST AM No date: DUPUYTREN / PALMAR FASCIOTOMY; Bilateral 11/28/2017: DUPUYTREN CONTRACTURE RELEASE; Left     Comment:  Procedure: DUPUYTREN CONTRACTURE RELEASE;  Surgeon:               Cleotilde Barrio, MD;  Location: [facility];  Service:               Orthopedics;  Laterality: Left;  left index and fourth               finger 07/05/2021: ESOPHAGOGASTRODUODENOSCOPY (EGD) WITH PROPOFOL ; N/A     Comment:  Procedure: ESOPHAGOGASTRODUODENOSCOPY (EGD) WITH               PROPOFOL ;  Surgeon: Maryruth Ole DASEN, MD;  Location:               [facility];  Service: Endoscopy;  Laterality: N/A; 1999: HAND SURGERY 11/2023: HIP SURGERY; Left 2012: JOINT REPLACEMENT; Right     Comment:  TKR No date: PARTIAL KNEE ARTHROPLASTY; Left 12/29/2019: SVT ABLATION; N/A     Comment:  Procedure: SVT ABLATION;  Surgeon: Waddell Danelle ORN, MD;               Location: [facility];  Service: Cardiovascular;                Laterality: N/A; 06/17/2017: TOTAL KNEE ARTHROPLASTY; Right BMI    Body Mass Index: 29.99 kg/m     Reproductive/Obstetrics negative OB ROS                              Anesthesia Physical Anesthesia Plan  ASA: 4  Anesthesia Plan: General   Post-op Pain Management:    Induction:   PONV Risk Score and Plan:   Airway Management Planned:   Additional Equipment:   Intra-op Plan:   Post-operative Plan:   Informed Consent: I have reviewed the patients History and Physical, chart, labs and discussed the procedure including the risks, benefits and alternatives for the proposed anesthesia with the patient or authorized representative who has indicated his/her understanding and acceptance.     Dental Advisory Given  Plan Discussed with: CRNA  Anesthesia Plan Comments:         Anesthesia Quick Evaluation

## 2024-06-03 NOTE — Transfer of Care (Signed)
 Immediate Anesthesia Transfer of Care Note  Patient: Nahun Kronberg  Procedure(s) Performed: CARDIOVERSION  Patient Location: Cath Lab  Anesthesia Type:General  Level of Consciousness: awake, oriented, drowsy, and patient cooperative  Airway & Oxygen Therapy: Patient Spontanous Breathing and Patient connected to nasal cannula oxygen  Post-op Assessment: Report given to RN and Post -op Vital signs reviewed and stable  Post vital signs: Reviewed and stable  Last Vitals:  Vitals Value Taken Time  BP 104/76 06/03/24 07:30  Temp    Pulse 70 06/03/24 07:31  Resp 15 06/03/24 07:32  SpO2 96 % 06/03/24 07:31  Vitals shown include unfiled device data.  Last Pain:  Vitals:   06/03/24 0654  TempSrc: Oral  PainSc: 0-No pain         Complications: No notable events documented.

## 2024-06-03 NOTE — CV Procedure (Signed)
Cardioversion procedure note ?For atrial fibrillation, persistent. ? ?Procedure Details: ? ?Consent: Risks of procedure as well as the alternatives and risks of each were explained to the (patient/caregiver).  Consent for procedure obtained. ? ?Time Out: Verified patient identification, verified procedure, site/side was marked, verified correct patient position, special equipment/implants available, medications/allergies/relevent history reviewed, required imaging and test results available.  Performed ? ?Patient placed on cardiac monitor, pulse oximetry, supplemental oxygen as necessary.   ?Sedation given: propofol IV, Dr.  ?Pacer pads placed anterior and posterior chest. ? ? ?Cardioverted 1 time(s).   ?Cardioverted at  150 J. Synchronized biphasic ?Converted to NSR ? ? ?Evaluation: ?Findings: Post procedure EKG shows: NSR ?Complications: None ?Patient did tolerate procedure well. ? ?Time Spent Directly with the Patient: ? ?45 minutes  ? ?Tim Iyonnah Ferrante, M.D., Ph.D. ? ?

## 2024-06-04 ENCOUNTER — Encounter: Payer: Self-pay | Admitting: Cardiovascular Disease

## 2024-06-04 NOTE — Anesthesia Postprocedure Evaluation (Signed)
 Anesthesia Post Note  Patient: Vincent [name]  Procedure(s) Performed: CARDIOVERSION  Patient location during evaluation: PACU Anesthesia Type: General Level of consciousness: awake and alert Pain management: pain level controlled Vital Signs Assessment: post-procedure vital signs reviewed and stable Respiratory status: spontaneous breathing, nonlabored ventilation, respiratory function stable and patient connected to nasal cannula oxygen Cardiovascular status: blood pressure returned to baseline and stable Postop Assessment: no apparent nausea or vomiting Anesthetic complications: no   No notable events documented.   Last Vitals:  Vitals:   06/03/24 0800 06/03/24 0815  BP: 100/64 103/67  Pulse: (!) 44 (!) 42  Resp: (!) 9 11  Temp:    SpO2: 97% 99%    Last Pain:  Vitals:   06/03/24 0815  TempSrc:   PainSc: (P) 0-No pain                 Lynwood KANDICE Clause

## 2024-06-07 NOTE — H&P (Signed)

## 2024-06-07 NOTE — Interval H&P Note (Signed)
 History and Physical Interval Note:  06/07/2024 4:51 PM  Vincent [name]  has presented today for surgery, with the diagnosis of Cardioversion  Afib.  The various methods of treatment have been discussed with the patient and family. After consideration of risks, benefits and other options for treatment, the patient has consented to  Procedure(s): CARDIOVERSION (N/A) as a surgical intervention.  The patient's history has been reviewed, patient examined, no change in status, stable for surgery.  I have reviewed the patient's chart and labs.  Questions were answered to the patient's satisfaction.     Minor Iden

## 2024-06-29 NOTE — Progress Notes (Unsigned)
 Cardiology Office Note:  .   Date:  07/01/2024  ID:  Vincent [name], DOB 08/02/1941, MRN [id number] PCP: Sadie Manna, MD  [facility] Providers Cardiologist:  Deatrice Cage, MD {  History of Present Illness: .   Vincent [name] is a 83 y.o. male with history of SVT/atrial flutter status post ablation 12/2019, PVCs, NICM with recovered EF, nonobstructive CAD, hyperlipidemia, OSA on CPAP.     Persistent atrial fibrillation Documented since 2015 in the setting of appendectomy.  Converted on amiodarone  but later discontinued due to hypothyroidism. 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  Ablation 12/2019.  Eliquis  stopped Recurrent atrial fibrillation 01/2020.  Eliquis  resumed Successful DCCV 02/2020 Recurrence of atrial fibrillation 05/29/2024 Successful DCCV 06/03/2024  NICM Remote history with EF as low as 35 to 40%, EF 50 to 55% 03/2012 EF 45 to 50% 10/2017 Has had normalization consistently since 12/10/2019 through 10/09/2022  Nonobstructive CAD Noted on cardiac catheterization 2009, 20% LAD 2013/2021 normal stress test  Social history  Very active likes to golf around 3 times a week.  Walking 8000 steps.     Patient with multiple arrhythmias status post SVT/atrial flutter ablation 12/2019 and now has been having recurrence of their atrial fibrillation again despite previous DCCV 02/2020.  He was noted to be in A-fib at his last appointment 7/10 and was successfully cardioverted 7/15.  Today patient presents after cardioversion.  He is back in atrial fibrillation.  Reports being completely asymptomatic and denying any palpitations, dizziness, shortness of breath, chest pain.  Furthermore denies any peripheral edema, orthopnea, any significant fatigue.  Reports he chronically has some degree of fatigue but no significant changes.  Continues to remain very active and with no interference with ADLs and demonstrates no exertional symptoms.  ROS:  Denies: Chest pain, shortness of breath, orthopnea, peripheral edema, palpitations, decreased exercise intolerance, fatigue, lightheadedness.   Studies Reviewed: SABRA    EKG Interpretation Date/Time:  Tuesday July 01 2024 14:04:30 EDT Ventricular Rate:  84 PR Interval:    QRS Duration:  94 QT Interval:  400 QTC Calculation: 472 R Axis:   -25  Text Interpretation: Atrial fibrillation Nonspecific ST abnormality When compared with ECG of 03-Jun-2024 07:39, back in afib after recent DCCV Confirmed by Darryle Currier [phone]) on 07/01/2024 2:13:49 PM    Risk Assessment/Calculations:    CHA2DS2-VASc Score = 3   This indicates a 3.2% annual risk of stroke. The patient's score is based upon: CHF History: 1 HTN History: 0 Diabetes History: 0 Stroke History: 0 Vascular Disease History: 0 Age Score: 2 Gender Score: 0      Physical Exam:   VS:  BP 100/70 (BP Location: Left Arm, Patient Position: Sitting, Cuff Size: Normal)   Pulse 84   Ht 5' 10.5 (1.791 m)   Wt 219 lb 4 oz (99.5 kg)   SpO2 98%   BMI 31.01 kg/m    Wt Readings from Last 3 Encounters:  07/01/24 219 lb 4 oz (99.5 kg)  06/03/24 212 lb (96.2 kg)  05/29/24 219 lb (99.3 kg)    GEN: Well nourished, well developed in no acute distress NECK: No JVD; No carotid bruits CARDIAC: IRRR, no murmurs, rubs, gallops RESPIRATORY:  Clear to auscultation without rales, wheezing or rhonchi  ABDOMEN: Soft, non-tender, non-distended EXTREMITIES:  No edema; No deformity   ASSESSMENT AND PLAN: .     Persistent atrial fibrillation - Atrial flutter/SVT ablation 12/2019 - DCCV 02/2020 - DCCV 06/03/2024 Today he is back in  atrial fibrillation with controlled ventricular rates around 84.  Completely asymptomatic.  Does not demonstrate any CHF symptoms.   Continue with Eliquis  5 mg twice daily, Toprol -XL 25mg . 100% compliant.  Referring to EP for further evaluation to discuss whether he would be an ablation candidate and or a candidate for  antiarrhythmic therapy such as Tikosyn. He has expressed interest with this.  TSH was normal in May 2025.   Will get an echocardiogram to evaluate any structural changes or valvular disease that may be reversible. Of note amiodarone  previously stopped due to hypothyroidism She is also on magnesium  400 mg twice daily.    NICM with recovered EF - Remote history of EF 35 to 40%.   Has had normalization since 2021.  Most recent echocardiogram 09/2022 with preserved biventricular function.  Mildly dilated LA with mild MR, mild AR.  Mild dilatation 39 mm. Will repeat echocardiogram given recurrent episodes of atrial fibrillation and need for monitoring of aortic dilatation/valvular disease.  Will also help to look at his atria to help assess future plans of DCCV again and help guide long term management.   Nonobstructive CAD Hyperlipidemia Noted on heart catheterization 2009.  Most recent Lexiscan  2021 was normal.  Denies any anginal complaints.  LDL 74 03/2024, continue with rosuvastatin 5 mg.  PVCs Remote history of high burden prior to his SVT ablation.  Dispo: Follow up with EP   Signed, Thom LITTIE Sluder, PA-C

## 2024-06-30 ENCOUNTER — Other Ambulatory Visit: Payer: Self-pay | Admitting: Internal Medicine

## 2024-06-30 DIAGNOSIS — M5432 Sciatica, left side: Secondary | ICD-10-CM

## 2024-07-01 ENCOUNTER — Ambulatory Visit
Admission: RE | Admit: 2024-07-01 | Discharge: 2024-07-01 | Disposition: A | Discharge status: Home / Self Care | Source: Ambulatory Visit | Attending: Internal Medicine | Admitting: Internal Medicine

## 2024-07-01 ENCOUNTER — Encounter: Payer: Self-pay | Admitting: Nurse Practitioner

## 2024-07-01 ENCOUNTER — Ambulatory Visit: Attending: Nurse Practitioner | Admitting: Cardiology

## 2024-07-01 VITALS — BP 100/70 | HR 84 | Ht 70.5 in | Wt 219.2 lb

## 2024-07-01 DIAGNOSIS — M5432 Sciatica, left side: Secondary | ICD-10-CM | POA: Insufficient documentation

## 2024-07-01 DIAGNOSIS — I471 Supraventricular tachycardia, unspecified: Secondary | ICD-10-CM

## 2024-07-01 DIAGNOSIS — I4819 Other persistent atrial fibrillation: Secondary | ICD-10-CM | POA: Diagnosis not present

## 2024-07-01 DIAGNOSIS — E785 Hyperlipidemia, unspecified: Secondary | ICD-10-CM | POA: Diagnosis not present

## 2024-07-01 DIAGNOSIS — I2510 Atherosclerotic heart disease of native coronary artery without angina pectoris: Secondary | ICD-10-CM | POA: Diagnosis not present

## 2024-07-01 DIAGNOSIS — I5032 Chronic diastolic (congestive) heart failure: Secondary | ICD-10-CM

## 2024-07-01 MED ORDER — GADOBUTROL 1 MMOL/ML IV SOLN
10.0000 mL | Freq: Once | INTRAVENOUS | Status: AC | PRN
  Administered 2024-07-01 (×2): 10 mL via INTRAVENOUS

## 2024-07-01 NOTE — Patient Instructions (Signed)
 Medication Instructions:  No changes *If you need a refill on your cardiac medications before your next appointment, please call your pharmacy*  Lab Work: None ordered If you have labs (blood work) drawn today and your tests are completely normal, you will receive your results only by: MyChart Message (if you have MyChart) OR A paper copy in the mail If you have any lab test that is abnormal or we need to change your treatment, we will call you to review the results.  Testing/Procedures: Your physician has requested that you have an echocardiogram. Echocardiography is a painless test that uses sound waves to create images of your heart. It provides your doctor with information about the size and shape of your heart and how well your heart's chambers and valves are working.   You may receive an ultrasound enhancing agent through an IV if needed to better visualize your heart during the echo. This procedure takes approximately one hour.  There are no restrictions for this procedure.  This will take place at [address] [facility]) [address], [place] [address]  Please note: We ask at that you not bring children with you during ultrasound (echo/ vascular) testing. Due to room size and safety concerns, children are not allowed in the ultrasound rooms during exams. Our front office staff cannot provide observation of children in our lobby area while testing is being conducted. An adult accompanying a patient to their appointment will only be allowed in the ultrasound room at the discretion of the ultrasound technician under special circumstances. We apologize for any inconvenience.   Follow-Up: At [facility], you and your health needs are our priority.  As part of our continuing mission to provide you with exceptional heart care, our providers are all part of one team.  This team includes your primary Cardiologist (physician) and Advanced Practice Providers or APPs  (Physician Assistants and Nurse Practitioners) who all work together to provide you with the care you need, when you need it.  Your next appointment:   A referral has been placed to Electrophysiology.

## 2024-07-10 ENCOUNTER — Ambulatory Visit: Attending: Cardiology

## 2024-07-10 DIAGNOSIS — I4819 Other persistent atrial fibrillation: Secondary | ICD-10-CM | POA: Diagnosis not present

## 2024-07-10 LAB — ECHOCARDIOGRAM COMPLETE
AR max vel: 2.89 cm2
AV Area VTI: 2.6 cm2
AV Area mean vel: 2.96 cm2
AV Mean grad: 2 mmHg
AV Peak grad: 3.5 mmHg
Ao pk vel: 0.93 m/s
S' Lateral: 4.43 cm

## 2024-07-11 ENCOUNTER — Ambulatory Visit: Payer: Self-pay | Admitting: Cardiology

## 2024-07-14 ENCOUNTER — Other Ambulatory Visit: Payer: Self-pay | Admitting: Cardiovascular Disease

## 2024-07-14 DIAGNOSIS — I480 Paroxysmal atrial fibrillation: Secondary | ICD-10-CM

## 2024-08-19 NOTE — Progress Notes (Unsigned)
  Electrophysiology Office Note:    Date:  08/19/2024   ID:  Vincent [name], Vincent [name] 1941-09-21, MRN [id number]  [facility] Cardiologist:  Deatrice Cage, MD  [facility] Electrophysiologist:  [name], MD   Referring MD: Vincent Thom CROME, PA-C   Chief Complaint: Persistent atrial fibrillation  History of Present Illness:    Vincent [name] is an 83 year old man who I am seeing today for an evaluation of atrial fibrillation at the request of Vincent [name].  The patient was last seen in clinic July 01, 2024.  He has a history of atrial flutter post ablation in February 2021, PVCs, nonischemic cardiomyopathy with recovered ejection fraction, hyperlipidemia and sleep apnea on CPAP.  He has had a cardioversion in the past for atrial fibrillation.  At the appointment on August 12 he reported no symptoms related to his atrial fibrillation.  His rates were controlled at that appointment.  He was previously on amiodarone  but this was stopped because of thyroid  dysfunction.      Their past medical, social and family history was reviewed.   ROS:   Please see the history of present illness.    All other systems reviewed and are negative.  EKGs/Labs/Other Studies Reviewed:    The following studies were reviewed today:  July 10, 2024 echo EF 55 to 55% RV normal Mild MR Trivial AI  July 01, 2024 EKG shows atrial fibrillation      Physical Exam:    VS:  There were no vitals taken for this visit.    Wt Readings from Last 3 Encounters:  07/01/24 219 lb 4 oz (99.5 kg)  06/03/24 212 lb (96.2 kg)  05/29/24 219 lb (99.3 kg)     GEN: no distress CARD: RRR, No MRG RESP: No IWOB. CTAB.        ASSESSMENT AND PLAN:    No diagnosis found.  #Persistent atrial fibrillation Asymptomatic.  Has not recovered ejection fraction on the most recent echo.  I discussed treatment options with the patient including antiarrhythmic drug therapy and catheter ablation.  We discussed that his  age puts him at slightly higher risk for complication during an atrial fibrillation ablation. After our discussion, the patient has elected to pursue a conservative management strategy and focus on rate control alone which I think is very reasonable.  He will continue taking metoprolol  succinate as prescribed.  He will continue taking Eliquis  for stroke prophylaxis.  He can follow-up with EP on an as-needed basis.     Signed, OLE ONEIDA. HOLTS, MD, [facility], [facility] 08/19/2024 5:47 PM    Electrophysiology [facility]

## 2024-08-20 ENCOUNTER — Encounter: Payer: Self-pay | Admitting: Cardiology

## 2024-08-20 ENCOUNTER — Ambulatory Visit: Attending: Cardiology | Admitting: Cardiology

## 2024-08-20 VITALS — BP 90/60 | HR 96 | Ht 70.5 in | Wt 222.1 lb

## 2024-08-20 DIAGNOSIS — I4819 Other persistent atrial fibrillation: Secondary | ICD-10-CM | POA: Diagnosis not present

## 2024-08-20 NOTE — Patient Instructions (Signed)
 Medication Instructions:  Your physician recommends that you continue on your current medications as directed. Please refer to the Current Medication list given to you today.  *If you need a refill on your cardiac medications before your next appointment, please call your pharmacy*  Follow-Up: At [facility], you and your health needs are our priority.  As part of our continuing mission to provide you with exceptional heart care, our providers are all part of one team.  This team includes your primary Cardiologist (physician) and Advanced Practice Providers or APPs (Physician Assistants and Nurse Practitioners) who all work together to provide you with the care you need, when you need it.  Your next appointment:   As needed with Electrophysiology

## 2024-09-11 ENCOUNTER — Other Ambulatory Visit: Payer: Self-pay

## 2024-09-11 DIAGNOSIS — I480 Paroxysmal atrial fibrillation: Secondary | ICD-10-CM

## 2024-09-11 MED ORDER — APIXABAN 5 MG PO TABS: 5.0000 mg | ORAL_TABLET | Freq: Two times a day (BID) | ORAL | 1 refills | Status: AC

## 2024-09-11 NOTE — Telephone Encounter (Signed)
 Prescription refill request for Eliquis  received. Indication:AFIB Last office visit:10/25 Scr:1.11  7/25 Age: 83 Weight:100.8  kg  Prescription refilled

## 2024-10-11 ENCOUNTER — Other Ambulatory Visit: Payer: Self-pay | Admitting: Urology

## 2024-10-15 ENCOUNTER — Ambulatory Visit: Payer: Self-pay | Admitting: Urology

## 2024-10-22 ENCOUNTER — Encounter: Payer: Self-pay | Admitting: Urology

## 2024-10-22 ENCOUNTER — Ambulatory Visit (INDEPENDENT_AMBULATORY_CARE_PROVIDER_SITE_OTHER): Admitting: Urology

## 2024-10-22 VITALS — BP 123/73 | HR 61 | Ht 70.0 in | Wt 216.0 lb

## 2024-10-22 DIAGNOSIS — N401 Enlarged prostate with lower urinary tract symptoms: Secondary | ICD-10-CM

## 2024-10-22 LAB — BLADDER SCAN AMB NON-IMAGING: Scan Result: 0

## 2024-10-22 NOTE — Progress Notes (Signed)
 10/22/2024 8:11 AM   [name] 07-25-1941 [id number]  Referring provider: Sadie Manna, MD [address] [facility] [place],  [place] [address]  Chief Complaint  Patient presents with   Benign Prostatic Hypertrophy   Urologic history: 1.  BPH with lower urinary tract symptoms Finasteride  5 mg daily Tamsulosin  added for increased urgency/nocturia Storage related voiding symptoms-no improvement with Myrbetriq , Gemtesa  or trospium    2.  Elevated PSA Prostate biopsy early 2000 PSA 6.1; benign Rebiopsy 2006; PSA 6.6; benign Started on dutasteride  with baseline PSA low-mid 2 range. Prostate cancer screening discontinued 2019  HPI: Vincent [name] is a 83 y.o. male who presents for annual follow-up.  No significant change in voiding symptoms since last year's visit.  He has discontinued the trospium  Denies dysuria, gross hematuria Remains on tamsulosin /finasteride  PCP has continued checking PSA which has been stable in the 2 range (uncorrected)   PMH: Past Medical History:  Diagnosis Date   Arthritis    Dizziness and giddiness    Dysrhythmia    GERD (gastroesophageal reflux disease)    Gout    History of kidney stones    History of NICM    a. Prev EF as low as 35-40%;  b. 03/2012 Echo: EF 50-55%, Gr 1 DD, mild MR;  c. 06/2014 Echo: EF 50-55%, gr 1 DD, mild MR, mildly dil LA; d. 11/2019 Echo: EF 50-55%.   History of systolic CHF    a. Prev EF as low as 35-40%;  b. 03/2012 Echo: EF 50-55%, Gr 1 DD, mild MR;  c. 06/2014 Echo: EF 50-55%, gr 1 DD; d. 11/2019 Echo: EF 50-55%, no rwma, mildly dil LA, nl RV fxn, triv MR/PR.   Hypothyroidism    Non-obstructive CAD    a. 2009 nonobs dzs, LAD 20, EF 40-45%;  b. 07/2012 Ex MV: EF 55%, freq pvc's, no ischemia/infarct.   PAF (paroxysmal atrial fibrillation) (HCC)    a. 11/2013 - occurred 3 wks post-op Appe in setting of pelvic abscess-->amio-->converted but amio later d/c'd 2/2 hypothyroidism; b. 01/2020  recurrent rate-controlled Afib-->Eliquis -->s/p DCCV 02/2020.   Paroxysmal Atrial flutter (HCC)    a. 12/2019 s/p RFCA.   Paroxysmal supraventricular tachycardia    a. SVT/PAT   Prostate enlargement    PSVT (paroxysmal supraventricular tachycardia)    a. 12/2019 s/p RFCA.   PVC's (premature ventricular contractions)    a. Asymptomatic - managed with Toprol  and Mg.   Renal insufficiency    Sciatica of right side     Surgical History: Past Surgical History:  Procedure Laterality Date   ABSCESS DRAINAGE     APPENDECTOMY  2015   BACK SURGERY     CARDIAC CATHETERIZATION  2009   CARDIOVERSION N/A 03/15/2020   Procedure: CARDIOVERSION;  Surgeon: Darron Deatrice LABOR, MD;  Location: [facility];  Service: Cardiovascular;  Laterality: N/A;   CARDIOVERSION N/A 06/03/2024   Procedure: CARDIOVERSION;  Surgeon: Perla Evalene PARAS, MD;  Location: [facility];  Service: Cardiovascular;  Laterality: N/A;   CATARACT EXTRACTION Right    CATARACT EXTRACTION W/PHACO Right 10/17/2017   Procedure: CATARACT EXTRACTION PHACO AND INTRAOCULAR LENS PLACEMENT (IOC) RIGHT;  Surgeon: Mittie Gaskin, MD;  Location: [facility];  Service: Ophthalmology;  Laterality: Right;   CATARACT EXTRACTION W/PHACO Left 12/24/2017   Procedure: CATARACT EXTRACTION PHACO AND INTRAOCULAR LENS PLACEMENT (IOC) LEFT;  Surgeon: Mittie Gaskin, MD;  Location: [facility];  Service: Ophthalmology;  Laterality: Left;   COLONOSCOPY     COLONOSCOPY WITH  PROPOFOL  N/A 11/06/2018   Procedure: COLONOSCOPY WITH PROPOFOL ;  Surgeon: Toledo, Ladell POUR, MD;  Location: [facility];  Service: Gastroenterology;  Laterality: N/A;   COLONOSCOPY WITH PROPOFOL  N/A 07/05/2021   Procedure: COLONOSCOPY WITH PROPOFOL ;  Surgeon: Maryruth Ole DASEN, MD;  Location: [facility];  Service: Endoscopy;  Laterality: N/A;  REQUEST AM   DUPUYTREN / PALMAR FASCIOTOMY Bilateral    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   Procedure:  DUPUYTREN CONTRACTURE RELEASE;  Surgeon: Cleotilde Barrio, MD;  Location: [facility];  Service: Orthopedics;  Laterality: Left;  left index and fourth finger   ESOPHAGOGASTRODUODENOSCOPY (EGD) WITH PROPOFOL  N/A 07/05/2021   Procedure: ESOPHAGOGASTRODUODENOSCOPY (EGD) WITH PROPOFOL ;  Surgeon: Maryruth Ole DASEN, MD;  Location: [facility];  Service: Endoscopy;  Laterality: N/A;   HAND SURGERY  1999   HIP SURGERY Left 11/2023   JOINT REPLACEMENT Right 2012   TKR   PARTIAL KNEE ARTHROPLASTY Left    SVT ABLATION N/A 12/29/2019   Procedure: SVT ABLATION;  Surgeon: Waddell Danelle ORN, MD;  Location: [facility];  Service: Cardiovascular;  Laterality: N/A;   TOTAL KNEE ARTHROPLASTY Right 06/17/2017    Home Medications:  Allergies as of 10/22/2024   No Known Allergies      Medication List        Accurate as of October 22, 2024  8:11 AM. If you have any questions, ask your nurse or doctor.          acetaminophen  500 MG tablet Commonly known as: TYLENOL  Take 500-1,000 mg by mouth every 6 (six) hours as needed for moderate pain (pain score 4-6) or headache.   amoxicillin 500 MG tablet Commonly known as: AMOXIL Take 2,000 mg by mouth See admin instructions. Take 4 capsules (2000 mg) by mouth 1 hour prior to dental procedures.   apixaban  5 MG Tabs tablet Commonly known as: Eliquis  Take 1 tablet (5 mg total) by mouth 2 (two) times daily.   azelastine 0.1 % nasal spray Commonly known as: ASTELIN Place 1 spray into both nostrils 2 (two) times daily as needed for rhinitis. Use in each nostril as directed   Biotin 5000 MCG Tabs Take 5,000 mcg by mouth daily.   cyanocobalamin 1000 MCG tablet Commonly known as: VITAMIN B12 Take 1,000 mcg by mouth daily.   finasteride  5 MG tablet Commonly known as: PROSCAR  TAKE 1 TABLET (5 MG TOTAL) BY MOUTH DAILY.   gabapentin  300 MG capsule Commonly known as: NEURONTIN  Take 300 mg by mouth 2 (two) times daily.   Iron 240 (27 Fe) MG  Tabs Take 27 mg by mouth daily.   lansoprazole 30 MG capsule Commonly known as: PREVACID Take 30 mg by mouth daily.   levothyroxine 25 MCG tablet Commonly known as: SYNTHROID Take 25 mcg by mouth daily before breakfast.   LUBRICATING EYE DROPS OP Apply 1 drop to eye 2 (two) times daily as needed (dry eyes).   magnesium  oxide 400 MG tablet Commonly known as: MAG-OX Take 400 mg by mouth 2 (two) times daily.   metoprolol  succinate 25 MG 24 hr tablet Commonly known as: TOPROL -XL TAKE 1 TABLET (25 MG TOTAL) BY MOUTH DAILY.   pantoprazole 40 MG tablet Commonly known as: PROTONIX Take 40 mg by mouth daily.   rosuvastatin 5 MG tablet Commonly known as: CRESTOR Take 5 mg by mouth daily.   tamsulosin  0.4 MG Caps capsule Commonly known as: FLOMAX  Take 1 capsule (0.4 mg total) by mouth daily.   Vitamin D3 Maximum Strength 125  MCG (5000 UT) capsule Generic drug: Cholecalciferol Take 5,000 Units by mouth daily.   Zinc 50 MG Tabs Take 50 mg by mouth daily.        Allergies: No Known Allergies  Family History: Family History  Problem Relation Age of Onset   Heart attack Father 41   Coronary artery disease Other        family hx   Heart failure Other        family hx - CHF    Social History:  reports that he has never smoked. He has never used smokeless tobacco. He reports that he does not drink alcohol and does not use drugs.   Physical Exam: There were no vitals taken for this visit.  Constitutional:  Alert, No acute distress. HEENT:  AT Respiratory: Normal respiratory effort, no increased work of breathing. Psychiatric: Normal mood and affect.   Assessment & Plan:    1. Benign prostatic hyperplasia with LUTS Stable lower urinary tract symptoms Continue tamsulosin /finasteride  Discussed appointment for second opinion with Dr. Gaston to discuss second line options for his storage related voiding symptoms or PTNS and he has elected not to pursue further  treatment at this time  2.  Elevated PSA Based on current prostate cancer screening guidelines and stability of his PSA would recommend discontinuing annual PSA checks   Glendia JAYSON Barba, MD  [facility] [address] [place], [place] [address] [phone]

## 2025-10-23 ENCOUNTER — Ambulatory Visit: Admitting: Urology
